# Patient Record
Sex: Female | Born: 1935 | Race: White | Hispanic: No | Smoking: Never smoker
Health system: Southern US, Community
[De-identification: ages and names within clinical notes are randomized; demographics above are authoritative.]

## PROBLEM LIST (undated history)

## (undated) DIAGNOSIS — G459 Transient cerebral ischemic attack, unspecified: Secondary | ICD-10-CM

## (undated) DIAGNOSIS — F0280 Dementia in other diseases classified elsewhere without behavioral disturbance: Secondary | ICD-10-CM

## (undated) DIAGNOSIS — I10 Essential (primary) hypertension: Secondary | ICD-10-CM

## (undated) DIAGNOSIS — S72001A Fracture of unspecified part of neck of right femur, initial encounter for closed fracture: Secondary | ICD-10-CM

## (undated) HISTORY — PX: ABDOMINAL HYSTERECTOMY: SHX81

## (undated) HISTORY — PX: FRACTURE SURGERY: SHX138

---

## 1997-07-25 ENCOUNTER — Other Ambulatory Visit: Admission: RE | Admit: 1997-07-25 | Discharge: 1997-07-25 | Payer: Self-pay | Admitting: Obstetrics & Gynecology

## 1997-09-27 ENCOUNTER — Ambulatory Visit (HOSPITAL_COMMUNITY): Admission: RE | Admit: 1997-09-27 | Discharge: 1997-09-27 | Payer: Self-pay | Admitting: Geriatric Medicine

## 1999-08-15 ENCOUNTER — Other Ambulatory Visit: Admission: RE | Admit: 1999-08-15 | Discharge: 1999-08-15 | Payer: Self-pay | Admitting: Obstetrics & Gynecology

## 2000-02-19 ENCOUNTER — Ambulatory Visit (HOSPITAL_COMMUNITY): Admission: RE | Admit: 2000-02-19 | Discharge: 2000-02-19 | Payer: Self-pay | Admitting: Geriatric Medicine

## 2000-04-22 ENCOUNTER — Ambulatory Visit (HOSPITAL_COMMUNITY): Admission: RE | Admit: 2000-04-22 | Discharge: 2000-04-22 | Payer: Self-pay | Admitting: Gastroenterology

## 2001-05-03 ENCOUNTER — Ambulatory Visit (HOSPITAL_COMMUNITY): Admission: RE | Admit: 2001-05-03 | Discharge: 2001-05-03 | Payer: Self-pay | Admitting: Geriatric Medicine

## 2001-07-16 ENCOUNTER — Other Ambulatory Visit: Admission: RE | Admit: 2001-07-16 | Discharge: 2001-07-16 | Payer: Self-pay | Admitting: Family Medicine

## 2004-01-01 ENCOUNTER — Other Ambulatory Visit: Admission: RE | Admit: 2004-01-01 | Discharge: 2004-01-01 | Payer: Self-pay | Admitting: Obstetrics & Gynecology

## 2008-08-10 ENCOUNTER — Ambulatory Visit: Payer: Self-pay | Admitting: Vascular Surgery

## 2010-08-30 NOTE — Procedures (Signed)
[facility]  Patient:    Kimberly Ramirez, Kimberly Ramirez                         MRN: [id number] Proc. Date: 04/22/00 Adm. Date:  [id number] Attending:  Louie Bun CC:         Hal T. Stoneking, M.D.   Procedure Report  PROCEDURE:  Colonoscopy.  INDICATION FOR PROCEDURE:  A 77 year old patient with no prior colon imaging referred for screening colonoscopy.  DESCRIPTION OF PROCEDURE:  The patient was placed in the left lateral decubitus position and placed on the pulse monitor with continuous low flow oxygen delivered by nasal cannula. She was sedated with 70 mg IV Demerol and 7 mg IV Versed. The Olympus video colonoscope was inserted into the rectum and advanced to the cecum, confirmed by transillumination at McBurneys point and visualization of the ileocecal valve and appendiceal orifice. The prep was excellent. The cecum, ascending, transverse, and descending colon all appeared normal with no masses, polyps, diverticula or other mucosal abnormalities. Within the sigmoid colon were seen a few scattered diverticula and no other abnormalities. The rectum appeared normal and retroflexed view of the anus revealed  no obvious internal hemorrhoids.  The colonoscope was then withdrawn and the patient returned to the recovery room in stable condition. The patient tolerated the procedure well and there were no immediate complications.  IMPRESSION:  Left sided diverticulosis otherwise normal colonoscopy.  PLAN:  Average risk colon cancer screening with consideration of flexible sigmoidoscopy in 5 years. DD:  04/22/00 TD:  04/22/00 Job: [id number]

## 2011-04-21 DIAGNOSIS — Z124 Encounter for screening for malignant neoplasm of cervix: Secondary | ICD-10-CM | POA: Diagnosis not present

## 2011-04-21 DIAGNOSIS — Z1212 Encounter for screening for malignant neoplasm of rectum: Secondary | ICD-10-CM | POA: Diagnosis not present

## 2011-06-04 DIAGNOSIS — B351 Tinea unguium: Secondary | ICD-10-CM | POA: Diagnosis not present

## 2011-06-04 DIAGNOSIS — M79609 Pain in unspecified limb: Secondary | ICD-10-CM | POA: Diagnosis not present

## 2011-06-23 DIAGNOSIS — M810 Age-related osteoporosis without current pathological fracture: Secondary | ICD-10-CM | POA: Diagnosis not present

## 2011-06-23 DIAGNOSIS — E349 Endocrine disorder, unspecified: Secondary | ICD-10-CM | POA: Diagnosis not present

## 2011-06-23 DIAGNOSIS — R29890 Loss of height: Secondary | ICD-10-CM | POA: Diagnosis not present

## 2011-06-23 DIAGNOSIS — N3945 Continuous leakage: Secondary | ICD-10-CM | POA: Diagnosis not present

## 2011-06-23 DIAGNOSIS — E8941 Symptomatic postprocedural ovarian failure: Secondary | ICD-10-CM | POA: Diagnosis not present

## 2011-06-30 DIAGNOSIS — Z85828 Personal history of other malignant neoplasm of skin: Secondary | ICD-10-CM | POA: Diagnosis not present

## 2011-06-30 DIAGNOSIS — L821 Other seborrheic keratosis: Secondary | ICD-10-CM | POA: Diagnosis not present

## 2011-09-03 DIAGNOSIS — B351 Tinea unguium: Secondary | ICD-10-CM | POA: Diagnosis not present

## 2011-09-03 DIAGNOSIS — M79609 Pain in unspecified limb: Secondary | ICD-10-CM | POA: Diagnosis not present

## 2011-11-27 DIAGNOSIS — H2640 Unspecified secondary cataract: Secondary | ICD-10-CM | POA: Diagnosis not present

## 2011-11-27 DIAGNOSIS — H3530 Unspecified macular degeneration: Secondary | ICD-10-CM | POA: Diagnosis not present

## 2011-11-27 DIAGNOSIS — H43819 Vitreous degeneration, unspecified eye: Secondary | ICD-10-CM | POA: Diagnosis not present

## 2011-11-27 DIAGNOSIS — Z961 Presence of intraocular lens: Secondary | ICD-10-CM | POA: Diagnosis not present

## 2011-12-09 DIAGNOSIS — M1710 Unilateral primary osteoarthritis, unspecified knee: Secondary | ICD-10-CM | POA: Diagnosis not present

## 2011-12-09 DIAGNOSIS — M25569 Pain in unspecified knee: Secondary | ICD-10-CM | POA: Diagnosis not present

## 2012-04-01 DIAGNOSIS — L821 Other seborrheic keratosis: Secondary | ICD-10-CM | POA: Diagnosis not present

## 2012-04-01 DIAGNOSIS — L719 Rosacea, unspecified: Secondary | ICD-10-CM | POA: Diagnosis not present

## 2012-04-01 DIAGNOSIS — Z85828 Personal history of other malignant neoplasm of skin: Secondary | ICD-10-CM | POA: Diagnosis not present

## 2012-04-27 DIAGNOSIS — Z130 Encounter for screening for diseases of the blood and blood-forming organs and certain disorders involving the immune mechanism: Secondary | ICD-10-CM | POA: Diagnosis not present

## 2012-04-27 DIAGNOSIS — Z1212 Encounter for screening for malignant neoplasm of rectum: Secondary | ICD-10-CM | POA: Diagnosis not present

## 2012-04-27 DIAGNOSIS — Z124 Encounter for screening for malignant neoplasm of cervix: Secondary | ICD-10-CM | POA: Diagnosis not present

## 2012-04-27 DIAGNOSIS — B373 Candidiasis of vulva and vagina: Secondary | ICD-10-CM | POA: Diagnosis not present

## 2012-07-01 DIAGNOSIS — L84 Corns and callosities: Secondary | ICD-10-CM | POA: Diagnosis not present

## 2012-07-01 DIAGNOSIS — B351 Tinea unguium: Secondary | ICD-10-CM | POA: Diagnosis not present

## 2012-07-01 DIAGNOSIS — M79609 Pain in unspecified limb: Secondary | ICD-10-CM | POA: Diagnosis not present

## 2012-10-08 DIAGNOSIS — Z1231 Encounter for screening mammogram for malignant neoplasm of breast: Secondary | ICD-10-CM | POA: Diagnosis not present

## 2012-10-21 DIAGNOSIS — Z961 Presence of intraocular lens: Secondary | ICD-10-CM | POA: Diagnosis not present

## 2012-10-21 DIAGNOSIS — M7660 Achilles tendinitis, unspecified leg: Secondary | ICD-10-CM | POA: Diagnosis not present

## 2012-10-21 DIAGNOSIS — L84 Corns and callosities: Secondary | ICD-10-CM | POA: Diagnosis not present

## 2012-10-21 DIAGNOSIS — H2640 Unspecified secondary cataract: Secondary | ICD-10-CM | POA: Diagnosis not present

## 2012-10-21 DIAGNOSIS — M79609 Pain in unspecified limb: Secondary | ICD-10-CM | POA: Diagnosis not present

## 2012-10-21 DIAGNOSIS — H3530 Unspecified macular degeneration: Secondary | ICD-10-CM | POA: Diagnosis not present

## 2012-10-21 DIAGNOSIS — H43819 Vitreous degeneration, unspecified eye: Secondary | ICD-10-CM | POA: Diagnosis not present

## 2012-10-21 DIAGNOSIS — B351 Tinea unguium: Secondary | ICD-10-CM | POA: Diagnosis not present

## 2013-01-19 ENCOUNTER — Ambulatory Visit: Payer: Self-pay | Admitting: Podiatrist

## 2013-01-21 DIAGNOSIS — L219 Seborrheic dermatitis, unspecified: Secondary | ICD-10-CM | POA: Diagnosis not present

## 2013-01-21 DIAGNOSIS — Z85828 Personal history of other malignant neoplasm of skin: Secondary | ICD-10-CM | POA: Diagnosis not present

## 2013-02-16 DIAGNOSIS — L821 Other seborrheic keratosis: Secondary | ICD-10-CM | POA: Diagnosis not present

## 2013-02-16 DIAGNOSIS — D239 Other benign neoplasm of skin, unspecified: Secondary | ICD-10-CM | POA: Diagnosis not present

## 2013-02-16 DIAGNOSIS — L219 Seborrheic dermatitis, unspecified: Secondary | ICD-10-CM | POA: Diagnosis not present

## 2013-02-16 DIAGNOSIS — T148 Other injury of unspecified body region: Secondary | ICD-10-CM | POA: Diagnosis not present

## 2013-02-16 DIAGNOSIS — B351 Tinea unguium: Secondary | ICD-10-CM | POA: Diagnosis not present

## 2013-02-16 DIAGNOSIS — Z79899 Other long term (current) drug therapy: Secondary | ICD-10-CM | POA: Diagnosis not present

## 2013-02-16 DIAGNOSIS — D1801 Hemangioma of skin and subcutaneous tissue: Secondary | ICD-10-CM | POA: Diagnosis not present

## 2013-02-16 DIAGNOSIS — Z85828 Personal history of other malignant neoplasm of skin: Secondary | ICD-10-CM | POA: Diagnosis not present

## 2013-02-16 DIAGNOSIS — L819 Disorder of pigmentation, unspecified: Secondary | ICD-10-CM | POA: Diagnosis not present

## 2013-02-22 DIAGNOSIS — Z79899 Other long term (current) drug therapy: Secondary | ICD-10-CM | POA: Diagnosis not present

## 2013-02-22 DIAGNOSIS — Z1331 Encounter for screening for depression: Secondary | ICD-10-CM | POA: Diagnosis not present

## 2013-02-22 DIAGNOSIS — I10 Essential (primary) hypertension: Secondary | ICD-10-CM | POA: Diagnosis not present

## 2013-02-22 DIAGNOSIS — E7800 Pure hypercholesterolemia, unspecified: Secondary | ICD-10-CM | POA: Diagnosis not present

## 2013-02-22 DIAGNOSIS — Z0000 Encounter for general adult medical examination without abnormal findings: Secondary | ICD-10-CM | POA: Diagnosis not present

## 2013-04-27 DIAGNOSIS — Z130 Encounter for screening for diseases of the blood and blood-forming organs and certain disorders involving the immune mechanism: Secondary | ICD-10-CM | POA: Diagnosis not present

## 2013-04-27 DIAGNOSIS — Z1212 Encounter for screening for malignant neoplasm of rectum: Secondary | ICD-10-CM | POA: Diagnosis not present

## 2013-04-27 DIAGNOSIS — N760 Acute vaginitis: Secondary | ICD-10-CM | POA: Diagnosis not present

## 2013-04-27 DIAGNOSIS — Z01419 Encounter for gynecological examination (general) (routine) without abnormal findings: Secondary | ICD-10-CM | POA: Diagnosis not present

## 2013-04-27 DIAGNOSIS — Z124 Encounter for screening for malignant neoplasm of cervix: Secondary | ICD-10-CM | POA: Diagnosis not present

## 2013-05-23 DIAGNOSIS — E7800 Pure hypercholesterolemia, unspecified: Secondary | ICD-10-CM | POA: Diagnosis not present

## 2013-05-23 DIAGNOSIS — Z79899 Other long term (current) drug therapy: Secondary | ICD-10-CM | POA: Diagnosis not present

## 2013-06-16 ENCOUNTER — Ambulatory Visit: Payer: Self-pay | Admitting: Podiatrist

## 2013-08-18 ENCOUNTER — Ambulatory Visit (INDEPENDENT_AMBULATORY_CARE_PROVIDER_SITE_OTHER): Payer: Medicare Other | Admitting: Podiatrist

## 2013-08-18 ENCOUNTER — Encounter: Payer: Self-pay | Admitting: Podiatrist

## 2013-08-18 VITALS — BP 155/71 | HR 82 | Resp 15 | Ht 65.0 in | Wt 123.0 lb

## 2013-08-18 DIAGNOSIS — M79609 Pain in unspecified limb: Secondary | ICD-10-CM | POA: Diagnosis not present

## 2013-08-18 DIAGNOSIS — B351 Tinea unguium: Secondary | ICD-10-CM | POA: Diagnosis not present

## 2013-08-18 NOTE — Progress Notes (Signed)
   Subjective:    [name], female    DOB: 02-03-1928, 80 y.o.   MRN: [id number]  HPI Comments: Pt presents for debridement of 1 - 10 toenails, B/L 5th toe corns.     Review of Systems  Skin:       Hair loss pt attributes to cholesterol medication or hormones.  All other systems reviewed and are negative.      Objective:   Physical Exam  Patient is awake, alert, and oriented x 3.  In no acute distress.  Neurovascular status is intact with palpable pedal pulses at 2/4 DP and PT bilateral and capillary refill time within normal limits. Neurological sensation is also intact bilaterally both epicritically and protectively. Dermatological exam reveals hyperkeratotic lesion present on the fifth digit bilaterally. Musculature intact with dorsiflexion, plantarflexion, inversion, eversion. Her nails are elongated, thickened, discolored, dystrophic and clinically mycotic.     Assessment & Plan:  Symptomatically mycotic toenails  Debridement of toenails carried out today without complication. She'll be seen back in 3 months or as needed for followup.

## 2013-08-23 DIAGNOSIS — E7800 Pure hypercholesterolemia, unspecified: Secondary | ICD-10-CM | POA: Diagnosis not present

## 2013-08-23 DIAGNOSIS — I10 Essential (primary) hypertension: Secondary | ICD-10-CM | POA: Diagnosis not present

## 2013-08-23 DIAGNOSIS — Z79899 Other long term (current) drug therapy: Secondary | ICD-10-CM | POA: Diagnosis not present

## 2013-08-23 DIAGNOSIS — Z23 Encounter for immunization: Secondary | ICD-10-CM | POA: Diagnosis not present

## 2013-09-01 DIAGNOSIS — H6120 Impacted cerumen, unspecified ear: Secondary | ICD-10-CM | POA: Diagnosis not present

## 2013-09-01 DIAGNOSIS — J310 Chronic rhinitis: Secondary | ICD-10-CM | POA: Diagnosis not present

## 2013-11-17 ENCOUNTER — Ambulatory Visit (INDEPENDENT_AMBULATORY_CARE_PROVIDER_SITE_OTHER): Payer: Medicare Other | Admitting: Podiatrist

## 2013-11-17 DIAGNOSIS — M79609 Pain in unspecified limb: Secondary | ICD-10-CM

## 2013-11-17 DIAGNOSIS — M79673 Pain in unspecified foot: Secondary | ICD-10-CM

## 2013-11-17 DIAGNOSIS — B351 Tinea unguium: Secondary | ICD-10-CM

## 2013-11-17 NOTE — Patient Instructions (Signed)
Achilles Tendinitis Achilles tendinitis is inflammation of the tough, cord-like band that attaches the lower muscles of your leg to your heel (Achilles tendon). It is usually caused by overusing the tendon and joint involved.  CAUSES Achilles tendinitis can happen because of:  A sudden increase in exercise or activity (such as running).  Doing the same exercises or activities (such as jumping) over and over.  Not warming up calf muscles before exercising.  Exercising in shoes that are worn out or not made for exercise.  Having arthritis or a bone growth on the back of the heel bone. This can rub against the tendon and hurt the tendon. SIGNS AND SYMPTOMS The most common symptoms are:  Pain in the back of the leg, just above the heel. The pain usually gets worse with exercise and better with rest.  Stiffness or soreness in the back of the leg, especially in the morning.  Swelling of the skin over the Achilles tendon.  Trouble standing on tiptoe. Sometimes, an Achilles tendon tears (ruptures). Symptoms of an Achilles tendon rupture can include:  Sudden, severe pain in the back of the leg.  Trouble putting weight on the foot or walking normally. DIAGNOSIS Achilles tendinitis will be diagnosed based on symptoms and a physical examination. An X-ray may be done to check if another condition is causing your symptoms. An MRI may be ordered if your health care provider suspects you may have completely torn your tendon, which is called an Achilles tendon rupture.  TREATMENT  Achilles tendinitis usually gets better over time. It can take weeks to months to heal completely. Treatment focuses on treating the symptoms and helping the injury heal. HOME CARE INSTRUCTIONS   Rest your Achilles tendon and avoid activities that cause pain.  Apply ice to the injured area:  Put ice in a plastic bag.  Place a towel between your skin and the bag.  Leave the ice on for 20 minutes, 2-3 times a  day  Try to avoid using the tendon (other than gentle range of motion) while the tendon is painful. Do not resume use until instructed by your health care provider. Then begin use gradually. Do not increase use to the point of pain. If pain does develop, decrease use and continue the above measures. Gradually increase activities that do not cause discomfort until you achieve normal use.  Do exercises to make your calf muscles stronger and more flexible. Your health care provider or physical therapist can recommend exercises for you to do.  Wrap your ankle with an elastic bandage or other wrap. This can help keep your tendon from moving too much. Your health care provider will show you how to wrap your ankle correctly.  Only take over-the-counter or prescription medicines for pain, discomfort, or fever as directed by your health care provider. SEEK MEDICAL CARE IF:   Your pain and swelling increase or pain is uncontrolled with medicines.  You develop new, unexplained symptoms or your symptoms get worse.  You are unable to move your toes or foot.  You develop warmth and swelling in your foot.  You have an unexplained temperature. MAKE SURE YOU:   Understand these instructions.  Will watch your condition.  Will get help right away if you are not doing well or get worse. Document Released: 01/08/2005 Document Revised: 01/19/2013 Document Reviewed: 11/10/2012 [employer] Patient Information 2015 [employer]. This information is not intended to replace advice given to you by your health care provider. Make sure you discuss   any questions you have with your health care provider.  

## 2013-11-17 NOTE — Progress Notes (Signed)
   Subjective:    [name], female    DOB: 1927-08-22, 80 y.o.   MRN: [id number]  HPI Pt will bring medication on next visit.   Review of Systems     Objective:   Physical Exam She has pain on the posterior aspect of the right heel which is new. Mild swelling is present and mild discomfort is palpated. Neurovascular status is intact and unchanged. She has a callus on the fifth digits bilaterally and on the right hallux and medial aspect of the first metatarsal head. Intact integument is present. Patient's toenails are also elongated, thickened, mycotic and symptomatic.    Assessment & Plan:  Symptomatic and mycotic toenails, calluses x3 on Achilles tendinitis  Plan: Debridement of calluses and nails is accomplished today without complication. She'll be seen back in 3 months or as needed for followup. Recommended heel lifts and stretching exercises were dispensed

## 2013-11-21 DIAGNOSIS — Z79899 Other long term (current) drug therapy: Secondary | ICD-10-CM | POA: Diagnosis not present

## 2013-11-21 DIAGNOSIS — E7800 Pure hypercholesterolemia, unspecified: Secondary | ICD-10-CM | POA: Diagnosis not present

## 2014-02-23 ENCOUNTER — Ambulatory Visit (INDEPENDENT_AMBULATORY_CARE_PROVIDER_SITE_OTHER): Payer: Medicare Other | Admitting: Podiatrist

## 2014-02-23 ENCOUNTER — Encounter: Payer: Self-pay | Admitting: Podiatrist

## 2014-02-23 DIAGNOSIS — M79676 Pain in unspecified toe(s): Secondary | ICD-10-CM | POA: Diagnosis not present

## 2014-02-23 DIAGNOSIS — B351 Tinea unguium: Secondary | ICD-10-CM

## 2014-02-24 NOTE — Progress Notes (Signed)
   Subjective:    [name], female    DOB: 1927-08-18, 80 y.o.   MRN: [id number]  HPI Comments: Pt presents for debridement of 1 - 10 toenails, B/L 5th toe corns.       Objective:   Physical Exam  Patient is awake, alert, and oriented x 3.  In no acute distress.  Neurovascular status is intact with palpable pedal pulses at 2/4 DP and PT bilateral and capillary refill time within normal limits. Neurological sensation is also intact bilaterally both epicritically and protectively. Dermatological exam reveals hyperkeratotic lesion present on the fifth digit bilaterally. Musculature intact with dorsiflexion, plantarflexion, inversion, eversion. Her nails are elongated, thickened, discolored, dystrophic and clinically mycotic.     Assessment & Plan:  Symptomatically mycotic toenails  Debridement of toenails carried out today without complication. She'll be seen back in 3 months or as needed for followup.

## 2014-04-04 DIAGNOSIS — Z0000 Encounter for general adult medical examination without abnormal findings: Secondary | ICD-10-CM | POA: Diagnosis not present

## 2014-04-04 DIAGNOSIS — I10 Essential (primary) hypertension: Secondary | ICD-10-CM | POA: Diagnosis not present

## 2014-04-04 DIAGNOSIS — Z1389 Encounter for screening for other disorder: Secondary | ICD-10-CM | POA: Diagnosis not present

## 2014-04-04 DIAGNOSIS — Z23 Encounter for immunization: Secondary | ICD-10-CM | POA: Diagnosis not present

## 2014-04-04 DIAGNOSIS — Z79899 Other long term (current) drug therapy: Secondary | ICD-10-CM | POA: Diagnosis not present

## 2014-04-04 DIAGNOSIS — E780 Pure hypercholesterolemia: Secondary | ICD-10-CM | POA: Diagnosis not present

## 2014-04-21 DIAGNOSIS — L821 Other seborrheic keratosis: Secondary | ICD-10-CM | POA: Diagnosis not present

## 2014-04-21 DIAGNOSIS — Z85828 Personal history of other malignant neoplasm of skin: Secondary | ICD-10-CM | POA: Diagnosis not present

## 2014-04-21 DIAGNOSIS — D225 Melanocytic nevi of trunk: Secondary | ICD-10-CM | POA: Diagnosis not present

## 2014-04-21 DIAGNOSIS — D2262 Melanocytic nevi of left upper limb, including shoulder: Secondary | ICD-10-CM | POA: Diagnosis not present

## 2014-04-21 DIAGNOSIS — L570 Actinic keratosis: Secondary | ICD-10-CM | POA: Diagnosis not present

## 2014-04-21 DIAGNOSIS — D1801 Hemangioma of skin and subcutaneous tissue: Secondary | ICD-10-CM | POA: Diagnosis not present

## 2014-04-21 DIAGNOSIS — L814 Other melanin hyperpigmentation: Secondary | ICD-10-CM | POA: Diagnosis not present

## 2014-04-21 DIAGNOSIS — L218 Other seborrheic dermatitis: Secondary | ICD-10-CM | POA: Diagnosis not present

## 2014-08-02 DIAGNOSIS — Z01419 Encounter for gynecological examination (general) (routine) without abnormal findings: Secondary | ICD-10-CM | POA: Diagnosis not present

## 2014-08-02 DIAGNOSIS — N958 Other specified menopausal and perimenopausal disorders: Secondary | ICD-10-CM | POA: Diagnosis not present

## 2014-08-02 DIAGNOSIS — Z6821 Body mass index (BMI) 21.0-21.9, adult: Secondary | ICD-10-CM | POA: Diagnosis not present

## 2014-08-02 DIAGNOSIS — M816 Localized osteoporosis [Lequesne]: Secondary | ICD-10-CM | POA: Diagnosis not present

## 2014-08-07 DIAGNOSIS — H52203 Unspecified astigmatism, bilateral: Secondary | ICD-10-CM | POA: Diagnosis not present

## 2014-08-07 DIAGNOSIS — H3531 Nonexudative age-related macular degeneration: Secondary | ICD-10-CM | POA: Diagnosis not present

## 2014-08-07 DIAGNOSIS — Z961 Presence of intraocular lens: Secondary | ICD-10-CM | POA: Diagnosis not present

## 2014-08-07 DIAGNOSIS — H26493 Other secondary cataract, bilateral: Secondary | ICD-10-CM | POA: Diagnosis not present

## 2014-08-30 DIAGNOSIS — N390 Urinary tract infection, site not specified: Secondary | ICD-10-CM | POA: Diagnosis not present

## 2014-10-31 ENCOUNTER — Encounter: Payer: Self-pay | Admitting: Podiatry

## 2014-10-31 ENCOUNTER — Ambulatory Visit (INDEPENDENT_AMBULATORY_CARE_PROVIDER_SITE_OTHER): Payer: Medicare Other | Admitting: Podiatry

## 2014-10-31 DIAGNOSIS — M79676 Pain in unspecified toe(s): Secondary | ICD-10-CM | POA: Diagnosis not present

## 2014-10-31 DIAGNOSIS — B351 Tinea unguium: Secondary | ICD-10-CM

## 2014-10-31 NOTE — Progress Notes (Signed)
[name], female   DOB: [birth year], 81 y.o.   MRN: [id number] Complaint:  Visit Type: Patient returns to my office for continued preventative foot care services. Complaint: Patient states" my nails have grown long and thick and become painful to walk and wear shoes" . She presents for preventative foot care services. No changes to ROS  Podiatric Exam: Vascular: dorsalis pedis and posterior tibial pulses are palpable bilateral. Capillary return is immediate. Temperature gradient is WNL. Skin turgor WNL  Sensorium: Normal Semmes Weinstein monofilament test. Normal tactile sensation bilaterally. Nail Exam: Pt has thick disfigured discolored nails with subungual debris noted bilateral entire nail hallux through fifth toenails Ulcer Exam: There is no evidence of ulcer or pre-ulcerative changes or infection. Orthopedic Exam: Muscle tone and strength are WNL. No limitations in general ROM. No crepitus or effusions noted. Foot type and digits show no abnormalities. Bony prominences are unremarkable. Skin: No Porokeratosis. No infection or ulcers.  Heloma durum 5th toe B/L  Diagnosis:  Tinea unguium, Pain in right toe, pain in left toes  Treatment & Plan Procedures and Treatment: Consent by patient was obtained for treatment procedures. The patient understood the discussion of treatment and procedures well. All questions were answered thoroughly reviewed. Debridement of mycotic and hypertrophic toenails, 1 through 5 bilateral and clearing of subungual debris. No ulceration, no infection noted.  Return Visit-Office Procedure: Patient instructed to return to the office for a follow up visit 3 months for continued evaluation and treatment.

## 2015-01-10 DIAGNOSIS — N3946 Mixed incontinence: Secondary | ICD-10-CM | POA: Diagnosis not present

## 2015-01-25 ENCOUNTER — Encounter: Payer: Self-pay | Admitting: Podiatry

## 2015-01-25 ENCOUNTER — Ambulatory Visit (INDEPENDENT_AMBULATORY_CARE_PROVIDER_SITE_OTHER): Payer: Medicare Other | Admitting: Podiatry

## 2015-01-25 DIAGNOSIS — M79676 Pain in unspecified toe(s): Secondary | ICD-10-CM | POA: Diagnosis not present

## 2015-01-25 DIAGNOSIS — B351 Tinea unguium: Secondary | ICD-10-CM | POA: Diagnosis not present

## 2015-01-25 DIAGNOSIS — L84 Corns and callosities: Secondary | ICD-10-CM

## 2015-01-25 NOTE — Progress Notes (Signed)
[name], female   DOB: 05-Mar-1928, 81 y.o.   MRN: [id number] Complaint:  Visit Type: Patient returns to my office for continued preventative foot care services. Complaint: Patient states" my nails have grown long and thick and become painful to walk and wear shoes" . She presents for preventative foot care services. No changes to ROS  Podiatric Exam: Vascular: dorsalis pedis and posterior tibial pulses are palpable bilateral. Capillary return is immediate. Temperature gradient is WNL. Skin turgor WNL  Sensorium: Normal Semmes Weinstein monofilament test. Normal tactile sensation bilaterally. Nail Exam: Pt has thick disfigured discolored nails with subungual debris noted bilateral entire nail hallux through fifth toenails Ulcer Exam: There is no evidence of ulcer or pre-ulcerative changes or infection. Orthopedic Exam: Muscle tone and strength are WNL. No limitations in general ROM. No crepitus or effusions noted. Foot type and digits show no abnormalities. Bony prominences are unremarkable. Skin: No Porokeratosis. No infection or ulcers.  Heloma durum 5th toe B/L  Diagnosis:  Tinea unguium, Pain in right toe, pain in left toe, f painful corn fifth toe left foot  Treatment & Plan Procedures and Treatment: Consent by patient was obtained for treatment procedures. The patient understood the discussion of treatment and procedures well. All questions were answered thoroughly reviewed. Debridement of mycotic and hypertrophic toenails, 1 through 5 bilateral and clearing of subungual debris. No ulceration, no infection noted. Debridement fifth toe left foot. Return Visit-Office Procedure: Patient instructed to return to the office for a follow up visit 3 months for continued evaluation and treatment.

## 2015-02-02 ENCOUNTER — Ambulatory Visit: Payer: Medicare Other | Admitting: Podiatry

## 2015-02-08 ENCOUNTER — Ambulatory Visit: Payer: Medicare Other | Admitting: Podiatry

## 2015-04-27 ENCOUNTER — Ambulatory Visit: Payer: Medicare Other | Admitting: Podiatry

## 2015-05-02 ENCOUNTER — Ambulatory Visit: Payer: Medicare Other | Admitting: Podiatry

## 2015-06-20 ENCOUNTER — Encounter: Payer: Self-pay | Admitting: Podiatry

## 2015-06-20 ENCOUNTER — Ambulatory Visit (INDEPENDENT_AMBULATORY_CARE_PROVIDER_SITE_OTHER): Payer: Medicare Other | Admitting: Podiatry

## 2015-06-20 DIAGNOSIS — M79676 Pain in unspecified toe(s): Secondary | ICD-10-CM

## 2015-06-20 DIAGNOSIS — L84 Corns and callosities: Secondary | ICD-10-CM | POA: Diagnosis not present

## 2015-06-20 DIAGNOSIS — B351 Tinea unguium: Secondary | ICD-10-CM

## 2015-06-20 NOTE — Progress Notes (Signed)
[name], female   DOB: 1927-05-18, 82 y.o.   MRN: [id number] Complaint:  Visit Type: Patient returns to my office for continued preventative foot care services. Complaint: Patient states" my nails have grown long and thick and become painful to walk and wear shoes" . She presents for preventative foot care services. No changes to ROS  Podiatric Exam: Vascular: dorsalis pedis and posterior tibial pulses are palpable bilateral. Capillary return is immediate. Temperature gradient is WNL. Skin turgor WNL  Sensorium: Normal Semmes Weinstein monofilament test. Normal tactile sensation bilaterally. Nail Exam: Pt has thick disfigured discolored nails with subungual debris noted bilateral entire nail hallux through fifth toenails Ulcer Exam: There is no evidence of ulcer or pre-ulcerative changes or infection. Orthopedic Exam: Muscle tone and strength are WNL. No limitations in general ROM. No crepitus or effusions noted. Foot type and digits show no abnormalities. Bony prominences are unremarkable. Skin: No Porokeratosis. No infection or ulcers.  Heloma durum 5th toe B/L  Diagnosis:  Tinea unguium, Pain in right toe, pain in left toe,  painful corn fifth toe left foot  Treatment & Plan Procedures and Treatment: Consent by patient was obtained for treatment procedures. The patient understood the discussion of treatment and procedures well. All questions were answered thoroughly reviewed. Debridement of mycotic and hypertrophic toenails, 1 through 5 bilateral and clearing of subungual debris. No ulceration, no infection noted. Debridement fifth toe left foot. Return Visit-Office Procedure: Patient instructed to return to the office for a follow up visit 3 months for continued evaluation and treatment.   Gardiner Barefoot DPM

## 2015-07-31 DIAGNOSIS — Z1389 Encounter for screening for other disorder: Secondary | ICD-10-CM | POA: Diagnosis not present

## 2015-07-31 DIAGNOSIS — Z0000 Encounter for general adult medical examination without abnormal findings: Secondary | ICD-10-CM | POA: Diagnosis not present

## 2015-08-15 DIAGNOSIS — Z124 Encounter for screening for malignant neoplasm of cervix: Secondary | ICD-10-CM | POA: Diagnosis not present

## 2015-08-15 DIAGNOSIS — N390 Urinary tract infection, site not specified: Secondary | ICD-10-CM | POA: Diagnosis not present

## 2015-08-15 DIAGNOSIS — Z1272 Encounter for screening for malignant neoplasm of vagina: Secondary | ICD-10-CM | POA: Diagnosis not present

## 2015-08-15 DIAGNOSIS — Z6821 Body mass index (BMI) 21.0-21.9, adult: Secondary | ICD-10-CM | POA: Diagnosis not present

## 2015-08-15 DIAGNOSIS — N760 Acute vaginitis: Secondary | ICD-10-CM | POA: Diagnosis not present

## 2015-08-29 DIAGNOSIS — Z79899 Other long term (current) drug therapy: Secondary | ICD-10-CM | POA: Diagnosis not present

## 2015-08-29 DIAGNOSIS — E7800 Pure hypercholesterolemia, unspecified: Secondary | ICD-10-CM | POA: Diagnosis not present

## 2015-08-29 DIAGNOSIS — Z23 Encounter for immunization: Secondary | ICD-10-CM | POA: Diagnosis not present

## 2015-08-29 DIAGNOSIS — I10 Essential (primary) hypertension: Secondary | ICD-10-CM | POA: Diagnosis not present

## 2015-09-12 ENCOUNTER — Ambulatory Visit: Payer: Medicare Other | Admitting: Podiatry

## 2015-09-12 DIAGNOSIS — L904 Acrodermatitis chronica atrophicans: Secondary | ICD-10-CM | POA: Diagnosis not present

## 2015-10-08 DIAGNOSIS — R42 Dizziness and giddiness: Secondary | ICD-10-CM | POA: Diagnosis not present

## 2015-10-08 DIAGNOSIS — H6121 Impacted cerumen, right ear: Secondary | ICD-10-CM | POA: Diagnosis not present

## 2015-11-29 ENCOUNTER — Ambulatory Visit (INDEPENDENT_AMBULATORY_CARE_PROVIDER_SITE_OTHER): Payer: Medicare Other | Admitting: Podiatry

## 2015-11-29 DIAGNOSIS — B351 Tinea unguium: Secondary | ICD-10-CM | POA: Diagnosis not present

## 2015-11-29 DIAGNOSIS — L84 Corns and callosities: Secondary | ICD-10-CM

## 2015-11-29 DIAGNOSIS — M79676 Pain in unspecified toe(s): Secondary | ICD-10-CM | POA: Diagnosis not present

## 2015-11-29 NOTE — Progress Notes (Signed)
[name], female   DOB: Jul 20, 1927, 82 y.o.   MRN: [id number] Complaint:  Visit Type: Patient returns to my office for continued preventative foot care services. Complaint: Patient states" my nails have grown long and thick and become painful to walk and wear shoes" . She presents for preventative foot care services. No changes to ROS  Podiatric Exam: Vascular: dorsalis pedis and posterior tibial pulses are palpable bilateral. Capillary return is immediate. Temperature gradient is WNL. Skin turgor WNL  Sensorium: Normal Semmes Weinstein monofilament test. Normal tactile sensation bilaterally. Nail Exam: Pt has thick disfigured discolored nails with subungual debris noted bilateral entire nail hallux through fifth toenails Ulcer Exam: There is no evidence of ulcer or pre-ulcerative changes or infection. Orthopedic Exam: Muscle tone and strength are WNL. No limitations in general ROM. No crepitus or effusions noted. HAV 1st MPJ  Right foot.. Skin: No Porokeratosis. No infection or ulcers.  Heloma durum 5th toe B/L  Diagnosis:  Tinea unguium, Pain in right toe, pain in left toe,  painful corn fifth toe left foot  Treatment & Plan Procedures and Treatment: Consent by patient was obtained for treatment procedures. The patient understood the discussion of treatment and procedures well. All questions were answered thoroughly reviewed. Debridement of mycotic and hypertrophic toenails, 1 through 5 bilateral and clearing of subungual debris. No ulceration, no infection noted. Debridement fifth toe left foot. Return Visit-Office Procedure: Patient instructed to return to the office for a follow up visit 3 months for continued evaluation and treatment.   Gardiner Barefoot DPM

## 2015-12-20 DIAGNOSIS — L718 Other rosacea: Secondary | ICD-10-CM | POA: Diagnosis not present

## 2015-12-20 DIAGNOSIS — Z85828 Personal history of other malignant neoplasm of skin: Secondary | ICD-10-CM | POA: Diagnosis not present

## 2016-02-11 DIAGNOSIS — Z23 Encounter for immunization: Secondary | ICD-10-CM | POA: Diagnosis not present

## 2016-02-14 ENCOUNTER — Ambulatory Visit (INDEPENDENT_AMBULATORY_CARE_PROVIDER_SITE_OTHER): Payer: Medicare Other | Admitting: Podiatry

## 2016-02-14 VITALS — Ht 65.0 in | Wt 123.0 lb

## 2016-02-14 DIAGNOSIS — L84 Corns and callosities: Secondary | ICD-10-CM | POA: Diagnosis not present

## 2016-02-14 DIAGNOSIS — M79676 Pain in unspecified toe(s): Secondary | ICD-10-CM

## 2016-02-14 DIAGNOSIS — B351 Tinea unguium: Secondary | ICD-10-CM

## 2016-02-14 NOTE — Progress Notes (Signed)
[name], female   DOB: 04/29/1927, 82 y.o.   MRN: [id number] Complaint:  Visit Type: Patient returns to my office for continued preventative foot care services. Complaint: Patient states" my nails have grown long and thick and become painful to walk and wear shoes" . She presents for preventative foot care services. No changes to ROS  Podiatric Exam: Vascular: dorsalis pedis and posterior tibial pulses are palpable bilateral. Capillary return is immediate. Temperature gradient is WNL. Skin turgor WNL  Sensorium: Normal Semmes Weinstein monofilament test. Normal tactile sensation bilaterally. Nail Exam: Pt has thick disfigured discolored nails with subungual debris noted bilateral entire nail hallux through fifth toenails Ulcer Exam: There is no evidence of ulcer or pre-ulcerative changes or infection. Orthopedic Exam: Muscle tone and strength are WNL. No limitations in general ROM. No crepitus or effusions noted. HAV 1st MPJ  Right foot.. Skin: No Porokeratosis. No infection or ulcers.  Heloma durum 5th toe B/L  Diagnosis:  Tinea unguium, Pain in right toe, pain in left toe,  painful corn fifth toe left foot  Treatment & Plan Procedures and Treatment: Consent by patient was obtained for treatment procedures. The patient understood the discussion of treatment and procedures well. All questions were answered thoroughly reviewed. Debridement of mycotic and hypertrophic toenails, 1 through 5 bilateral and clearing of subungual debris. No ulceration, no infection noted. Debridement fifth toe left foot. Return Visit-Office Procedure: Patient instructed to return to the office for a follow up visit 3 months for continued evaluation and treatment.   [name] 

## 2016-05-14 ENCOUNTER — Ambulatory Visit: Payer: Medicare Other | Admitting: Podiatry

## 2016-05-15 ENCOUNTER — Ambulatory Visit: Payer: Medicare Other | Admitting: Podiatry

## 2016-05-16 ENCOUNTER — Observation Stay (HOSPITAL_COMMUNITY)
Admission: EM | Admit: 2016-05-16 | Discharge: 2016-05-17 | Disposition: A | Discharge status: Home / Self Care | Payer: Medicare Other | Attending: Internal Medicine | Admitting: Internal Medicine

## 2016-05-16 ENCOUNTER — Emergency Department (HOSPITAL_COMMUNITY): Payer: Medicare Other

## 2016-05-16 ENCOUNTER — Encounter (HOSPITAL_COMMUNITY): Payer: Self-pay | Admitting: Nurse Practitioner

## 2016-05-16 DIAGNOSIS — R4701 Aphasia: Secondary | ICD-10-CM | POA: Diagnosis not present

## 2016-05-16 DIAGNOSIS — B9620 Unspecified Escherichia coli [E. coli] as the cause of diseases classified elsewhere: Secondary | ICD-10-CM | POA: Insufficient documentation

## 2016-05-16 DIAGNOSIS — R2681 Unsteadiness on feet: Secondary | ICD-10-CM | POA: Insufficient documentation

## 2016-05-16 DIAGNOSIS — N390 Urinary tract infection, site not specified: Secondary | ICD-10-CM | POA: Insufficient documentation

## 2016-05-16 DIAGNOSIS — I10 Essential (primary) hypertension: Secondary | ICD-10-CM | POA: Diagnosis not present

## 2016-05-16 DIAGNOSIS — Z79899 Other long term (current) drug therapy: Secondary | ICD-10-CM | POA: Insufficient documentation

## 2016-05-16 DIAGNOSIS — Z7902 Long term (current) use of antithrombotics/antiplatelets: Secondary | ICD-10-CM | POA: Diagnosis not present

## 2016-05-16 DIAGNOSIS — E785 Hyperlipidemia, unspecified: Secondary | ICD-10-CM | POA: Insufficient documentation

## 2016-05-16 DIAGNOSIS — G459 Transient cerebral ischemic attack, unspecified: Secondary | ICD-10-CM | POA: Diagnosis present

## 2016-05-16 DIAGNOSIS — G451 Carotid artery syndrome (hemispheric): Principal | ICD-10-CM | POA: Insufficient documentation

## 2016-05-16 LAB — DIFFERENTIAL
BASOS PCT: 0 %
Basophils Absolute: 0 10*3/uL (ref 0.0–0.1)
Eosinophils Absolute: 0.1 10*3/uL (ref 0.0–0.7)
Eosinophils Relative: 1 %
Lymphocytes Relative: 23 %
Lymphs Abs: 1.5 10*3/uL (ref 0.7–4.0)
MONO ABS: 0.5 10*3/uL (ref 0.1–1.0)
MONOS PCT: 7 %
NEUTROS ABS: 4.5 10*3/uL (ref 1.7–7.7)
Neutrophils Relative %: 69 %

## 2016-05-16 LAB — I-STAT CHEM 8, ED
BUN: 18 mg/dL (ref 6–20)
CALCIUM ION: 1.18 mmol/L (ref 1.15–1.40)
Chloride: 102 mmol/L (ref 101–111)
Creatinine, Ser: 0.8 mg/dL (ref 0.44–1.00)
Glucose, Bld: 100 mg/dL — ABNORMAL HIGH (ref 65–99)
HCT: 39 % (ref 36.0–46.0)
Hemoglobin: 13.3 g/dL (ref 12.0–15.0)
Potassium: 3.9 mmol/L (ref 3.5–5.1)
SODIUM: 139 mmol/L (ref 135–145)
TCO2: 29 mmol/L (ref 0–100)

## 2016-05-16 LAB — CBC
HEMATOCRIT: 38.4 % (ref 36.0–46.0)
Hemoglobin: 12.5 g/dL (ref 12.0–15.0)
MCH: 31.1 pg (ref 26.0–34.0)
MCHC: 32.6 g/dL (ref 30.0–36.0)
MCV: 95.5 fL (ref 78.0–100.0)
Platelets: 225 10*3/uL (ref 150–400)
RBC: 4.02 MIL/uL (ref 3.87–5.11)
RDW: 12.4 % (ref 11.5–15.5)
WBC: 6.5 10*3/uL (ref 4.0–10.5)

## 2016-05-16 LAB — URINALYSIS, ROUTINE W REFLEX MICROSCOPIC
Bilirubin Urine: NEGATIVE
GLUCOSE, UA: NEGATIVE mg/dL
Ketones, ur: NEGATIVE mg/dL
NITRITE: POSITIVE — AB
Protein, ur: NEGATIVE mg/dL
Specific Gravity, Urine: 1.01 (ref 1.005–1.030)
pH: 6 (ref 5.0–8.0)

## 2016-05-16 LAB — COMPREHENSIVE METABOLIC PANEL
ALK PHOS: 74 U/L (ref 38–126)
ALT: 16 U/L (ref 14–54)
AST: 49 U/L — ABNORMAL HIGH (ref 15–41)
Albumin: 4.2 g/dL (ref 3.5–5.0)
Anion gap: 10 (ref 5–15)
BUN: 15 mg/dL (ref 6–20)
CALCIUM: 9.6 mg/dL (ref 8.9–10.3)
CHLORIDE: 101 mmol/L (ref 101–111)
CO2: 26 mmol/L (ref 22–32)
CREATININE: 0.84 mg/dL (ref 0.44–1.00)
Glucose, Bld: 106 mg/dL — ABNORMAL HIGH (ref 65–99)
Potassium: 3.9 mmol/L (ref 3.5–5.1)
Sodium: 137 mmol/L (ref 135–145)
Total Bilirubin: 0.5 mg/dL (ref 0.3–1.2)
Total Protein: 8.3 g/dL — ABNORMAL HIGH (ref 6.5–8.1)

## 2016-05-16 LAB — CBG MONITORING, ED: Glucose-Capillary: 93 mg/dL (ref 65–99)

## 2016-05-16 LAB — APTT: aPTT: 41 seconds — ABNORMAL HIGH (ref 24–36)

## 2016-05-16 LAB — PROTIME-INR
INR: 0.96
Prothrombin Time: 12.8 seconds (ref 11.4–15.2)

## 2016-05-16 MED ORDER — LORAZEPAM 2 MG/ML IJ SOLN: 0.5000 mg | Freq: Once | INTRAMUSCULAR | Status: DC | PRN

## 2016-05-16 NOTE — ED Notes (Signed)
attempted report x1 

## 2016-05-16 NOTE — ED Notes (Signed)
IV intact. IV site appears to be bruised. Positive for blood return and flushes with ease.

## 2016-05-16 NOTE — ED Triage Notes (Addendum)
Pt presents with c/o stroke symptoms began today. She was talking on the phone with her sister and suddenly could not get her words out. This lasted for about 3 minutes then resolved. She states she still doesn't really feel well but does not have any dizziness, speech difficulty, unilateral weakness, facial droop, arm drift. She reports strong smelling urine and malaise for the past 2 days. She took 4 baby aspirin prior to arrival

## 2016-05-16 NOTE — ED Provider Notes (Signed)
[facility] DEPT Provider Note   CSN: [id number] Arrival date & time: 05/16/16  1712     History   Chief Complaint Chief Complaint  Patient presents with  . Stroke Symptoms    HPI Britley Chio is a 83 y.o. female.  Patient is an 58 are old female who has no medical problems and lives independently presents today with a 5 minute episode of a seizure that occurred at 9 AM this morning when she was talking to her sister. Patient states she could understand everything her sister said to her by her speech was garbled. This has since resolved. She denied any unilateral weakness or numbness. No visual changes or difficulty walking during this time. She has no prior history of stroke. She also notes over the last few days she's had some dysuria and decreased appetite but denies any abdominal pain, nausea, vomiting or weakness.   The history is provided by the patient.    History reviewed. No pertinent past medical history.  There are no active problems to display for this patient.   Past Surgical History:  Procedure Laterality Date  . ABDOMINAL HYSTERECTOMY      OB History    No data available       Home Medications    Prior to Admission medications   Medication Sig Start Date End Date Taking? Authorizing Provider  amLODipine (NORVASC) 5 MG tablet  08/29/15   Historical Provider, MD  Dabigatran Etexilate Mesylate (PRADAXA PO) Take by mouth 2 (two) times daily.    Historical Provider, MD    Family History History reviewed. No pertinent family history.  Social History Social History  Substance Use Topics  . Smoking status: Never Smoker  . Smokeless tobacco: Never Used  . Alcohol use No     Allergies   Patient has no known allergies.   Review of Systems Review of Systems  All other systems reviewed and are negative.    Physical Exam Updated Vital Signs BP 184/69   Pulse 90   Temp 98.3 F (36.8 C) (Oral)   Resp 18   Ht 5\' 5"  (1.651 m)   Wt 120 lb  (54.4 kg)   SpO2 100%   BMI 19.97 kg/m   Physical Exam  Constitutional: She is oriented to person, place, and time. She appears well-developed and well-nourished. No distress.  HENT:  Head: Normocephalic and atraumatic.  Mouth/Throat: Oropharynx is clear and moist.  Eyes: Conjunctivae and EOM are normal. Pupils are equal, round, and reactive to light.  Neck: Normal range of motion. Neck supple. Carotid bruit is not present.  Cardiovascular: Normal rate, regular rhythm and intact distal pulses.   No murmur heard. Pulmonary/Chest: Effort normal and breath sounds normal. No respiratory distress. She has no wheezes. She has no rales.  Abdominal: Soft. She exhibits no distension. There is no tenderness. There is no rebound and no guarding.  Musculoskeletal: Normal range of motion. She exhibits no edema or tenderness.  Neurological: She is alert and oriented to person, place, and time. She has normal strength. No cranial nerve deficit or sensory deficit. Coordination and gait normal.  Speech is normal without slurring or aphasia.  No pronator drift.  Visual fields intact  Skin: Skin is warm and dry. No rash noted. No erythema.  Psychiatric: She has a normal mood and affect. Her behavior is normal.  Nursing note and vitals reviewed.    ED Treatments / Results  Labs (all labs ordered are listed, but only abnormal results are  displayed) Labs Reviewed  APTT - Abnormal; Notable for the following:       Result Value   aPTT 41 (*)    All other components within normal limits  COMPREHENSIVE METABOLIC PANEL - Abnormal; Notable for the following:    Glucose, Bld 106 (*)    Total Protein 8.3 (*)    AST 49 (*)    All other components within normal limits  I-STAT CHEM 8, ED - Abnormal; Notable for the following:    Glucose, Bld 100 (*)    All other components within normal limits  PROTIME-INR  CBC  DIFFERENTIAL  URINALYSIS, ROUTINE W REFLEX MICROSCOPIC  I-STAT TROPOININ, ED  CBG  MONITORING, ED    EKG  EKG Interpretation  Date/Time:  Friday May 16 2016 17:49:43 EST Ventricular Rate:  85 PR Interval:  124 QRS Duration: 70 QT Interval:  370 QTC Calculation: 440 R Axis:   60 Text Interpretation:  Normal sinus rhythm Possible Left atrial enlargement ST & T wave abnormality, consider inferior ischemia No previous tracing Confirmed by Maryan Rued  MD, Loree Fee ([id number]) on 05/16/2016 7:39:56 PM       Radiology Ct Head Wo Contrast  Result Date: 05/16/2016 CLINICAL DATA:  Acute onset of transient aphasia while on telephone with sister. Initial encounter. EXAM: CT HEAD WITHOUT CONTRAST TECHNIQUE: Contiguous axial images were obtained from the base of the skull through the vertex without intravenous contrast. COMPARISON:  None. FINDINGS: Brain: No evidence of acute infarction, hemorrhage, hydrocephalus, extra-axial collection or mass lesion/mass effect. Prominence of the ventricles suggests mild cortical volume loss. Scattered periventricular and subcortical white matter change likely reflects small vessel ischemic microangiopathy. The brainstem and fourth ventricle are within normal limits. The basal ganglia are unremarkable in appearance. The cerebral hemispheres demonstrate grossly normal gray-white differentiation. No mass effect or midline shift is seen. Vascular: No hyperdense vessel or unexpected calcification. Skull: There is no evidence of fracture; visualized osseous structures are unremarkable in appearance. Sinuses/Orbits: The orbits are within normal limits. Mucoperiosteal thickening is noted at the left maxillary sinus. The remaining paranasal sinuses and mastoid air cells are well-aerated. Other: No significant soft tissue abnormalities are seen. IMPRESSION: 1. No acute intracranial pathology seen on CT. 2. Mild cortical volume loss and scattered small vessel ischemic microangiopathy. 3. Mucoperiosteal thickening at the left maxillary sinus. (noted in the Electronically  Signed   By: Garald Balding M.D.   On: 05/16/2016 18:53    Procedures Procedures (including critical care time)  Medications Ordered in ED Medications - No data to display   Initial Impression / Assessment and Plan / ED Course  I have reviewed the triage vital signs and the nursing notes.  Pertinent labs & imaging results that were available during my care of the patient were reviewed by me and considered in my medical decision making (see chart for details).    Patient is an 83 y/o female presenting today with symptoms concerning for a TIA. No prior symptoms similar to this. Currently asymptomatic.  No evidence of atrial fibrillation on monitor EKG. Patient has no carotid bruits. CT negative for acute pathology. Labs without significant findings. UA still pending as patient is complaining of dysuria. Feel that patient needs admission for TIA workup  Final Clinical Impressions(s) / ED Diagnoses   Final diagnoses:  Transient cerebral ischemia, unspecified type    New Prescriptions New Prescriptions   No medications on file     Blanchie Dessert, MD 05/16/16 2036

## 2016-05-16 NOTE — Consult Note (Signed)
NEURO HOSPITALIST CONSULT NOTE   Requestig physician: Dr. Loleta Books  Reason for Consult: Transient dysphasia  History obtained from:  Patient and Chart     HPI:                                                                                                                                          Kimberly Ramirez is an 83 y.o. female who presents with a 5 minute episode of speech dysfunction. Symptoms began while talking on the phone with her sister at 51 AM today. All of a sudden, she "could not get her words out". The episode lasted approximately 3 minutes. The patient recalls being able to understand what her sister was saying to her. No associated weakness, vision changes, gait instability or numbness. BP in the ED was 184/69. CT head was unremarkable for her age. EKG without atrial fibrillation. She has no prior history of stroke or MI. She states that she took four 81 mg ASA tablets prior to arrival.  PMHx - not available in EPIC or Care Everywhere  Past Surgical History:  Procedure Laterality Date  . ABDOMINAL HYSTERECTOMY      Family History  Problem Relation Age of Onset  . Parkinson's disease Mother   . Heart Problems Father    Social History:  reports that she has never smoked. She has never used smokeless tobacco. She reports that she does not drink alcohol or use drugs.  No Known Allergies  MEDICATIONS:                                                                                                                     Scheduled: .  stroke: mapping our early stages of recovery book   Does not apply Once  . aspirin  300 mg Rectal Daily   Or  . aspirin  325 mg Oral Daily  . enoxaparin (LOVENOX) injection  40 mg Subcutaneous Daily     ROS:  History obtained from patient. No headache, chest pain, vision changes, abdominal pain  or SOB. States she had some neck pain during her spell. No focal weakness or numbness. Other ROS as per HPI.   Blood pressure (!) 147/74, pulse 84, temperature 97.5 F (36.4 C), temperature source Oral, resp. rate 20, height 5\' 5"  (1.651 m), weight 55 kg (121 lb 4.1 oz), SpO2 99 %.   General Examination:                                                                                                      HEENT-  Normocephalic/atraumatic. Lungs- No gross wheezing. Respirations unlabored.  Extremities- Warm and well perfused.   Neurological Examination Mental Status: Alert and fully oriented. Thought content appropriate.  Speech fluent with intact repetition, naming and comprehension. Able to follow all commands without difficulty. Cranial Nerves: II: Visual fields intact to bedside confrontation. PERRL. III,IV, VI: ptosis not present, EOMI.  V,VII: smile symmetric, facial temperature sensation normal bilaterally VIII: hearing intact to conversation IX,X: no hoarseness or hypophonia XI: bilateral shoulder shrug is symmetric XII: midline tongue extension Motor: Right : Upper extremity   5/5    Left:     Upper extremity   5/5  Lower extremity   5/5     Lower extremity   5/5 No pronator drift.   Sensory: Temperature and light touch intact in proximal upper and lower extremities. No extinction noted.  Deep Tendon Reflexes: 2+ brachioradialis, biceps and patellae bilaterally. 1+ achilles bilaterally. Right toe upgoing, left toe downgoing.  Cerebellar: Normal FNF bilaterally.  Gait: Deferred  Lab Results: Basic Metabolic Panel:  Recent Labs Lab 05/16/16 1812 05/16/16 1827  NA 137 139  K 3.9 3.9  CL 101 102  CO2 26  --   GLUCOSE 106* 100*  BUN 15 18  CREATININE 0.84 0.80  CALCIUM 9.6  --     Liver Function Tests:  Recent Labs Lab 05/16/16 1812  AST 49*  ALT 16  ALKPHOS 74  BILITOT 0.5  PROT 8.3*  ALBUMIN 4.2   No results for input(s): LIPASE, AMYLASE in the last 168  hours. No results for input(s): AMMONIA in the last 168 hours.  CBC:  Recent Labs Lab 05/16/16 1812 05/16/16 1827  WBC 6.5  --   NEUTROABS 4.5  --   HGB 12.5 13.3  HCT 38.4 39.0  MCV 95.5  --   PLT 225  --     Cardiac Enzymes: No results for input(s): CKTOTAL, CKMB, CKMBINDEX, TROPONINI in the last 168 hours.  Lipid Panel: No results for input(s): CHOL, TRIG, HDL, CHOLHDL, VLDL, LDLCALC in the last 168 hours.  CBG:  Recent Labs Lab 05/16/16 1935  GLUCAP 93    Microbiology: No results found for this or any previous visit.  Coagulation Studies:  Recent Labs  05/16/16 1812  LABPROT 12.8  INR 0.96    Imaging: Ct Head Wo Contrast  Result Date: 05/16/2016 CLINICAL DATA:  Acute onset of transient aphasia while on telephone with sister. Initial encounter. EXAM: CT HEAD WITHOUT CONTRAST TECHNIQUE: Contiguous  axial images were obtained from the base of the skull through the vertex without intravenous contrast. COMPARISON:  None. FINDINGS: Brain: No evidence of acute infarction, hemorrhage, hydrocephalus, extra-axial collection or mass lesion/mass effect. Prominence of the ventricles suggests mild cortical volume loss. Scattered periventricular and subcortical white matter change likely reflects small vessel ischemic microangiopathy. The brainstem and fourth ventricle are within normal limits. The basal ganglia are unremarkable in appearance. The cerebral hemispheres demonstrate grossly normal gray-white differentiation. No mass effect or midline shift is seen. Vascular: No hyperdense vessel or unexpected calcification. Skull: There is no evidence of fracture; visualized osseous structures are unremarkable in appearance. Sinuses/Orbits: The orbits are within normal limits. Mucoperiosteal thickening is noted at the left maxillary sinus. The remaining paranasal sinuses and mastoid air cells are well-aerated. Other: No significant soft tissue abnormalities are seen. IMPRESSION: 1. No  acute intracranial pathology seen on CT. 2. Mild cortical volume loss and scattered small vessel ischemic microangiopathy. 3. Mucoperiosteal thickening at the left maxillary sinus. (noted in the Electronically Signed   By: Garald Balding M.D.   On: 05/16/2016 18:53    Assessment: 1. Five minute episode of speech dysfunction. Described symptoms are suggestive of TIA affecting Broca's area (left frontal operculum).  2. CT head negative for acute abnormality. Mild atrophy and chronic small vessel ischemic changes are noted.   Recommendations: 1. Stroke work up to include MRI brain, MRA head, carotid ultrasound and TTE.  2. Continue ASA 325 mg po qd 3. Lipid panel and HgbA1c 4. Given her advanced age, overall benefits of statin likely outweighed by risks.  5. PT/OT/Speech.  6. Permissive HTN x 24 hours.  7. Telemetry.   Electronically signed: Dr. Kerney Elbe 05/16/2016, 10:24 PM

## 2016-05-16 NOTE — ED Notes (Signed)
Pt ambulatory to the bathroom with assistance.

## 2016-05-16 NOTE — H&P (Signed)
History and Physical  Patient Name: Kimberly Ramirez     [id number]    DOB: 08-09-1927    DOA: 05/16/2016 PCP: Mathews Argyle, MD   Patient coming from: Home     Chief Complaint: Speech disturbance  HPI: Kimberly Ramirez is a 83 y.o. female with no significant past medical history who presents with transient speech disturbance.  The patient was in her usual state of health until this afternoon, she was on the phone with her sister long-distance when she suddenly "couldn't speak right" and the words were garbled.  This lasted less than 5 minutes, resolved spontaneously, and she got off the phone and called her daughter who brought her to the ER.  There was no weakness, numbness accompanying.    ED course: -Afebrile, heart rate 90, respirations and pulse ox normal, BP 184/69 -Na 137, K 3.9, Cr 0.84, WBC 6.5K, Hgb 12.5 -INR normal, PTT slightly elevated -CT head without contrast unremarkable for age -ECG showed sinus rhythm, nonspecific TW changes only -TRH were asked to evaluate for TIA     Review of systems:  Review of Systems  Genitourinary: Positive for dysuria and urgency.  Neurological: Positive for speech change. Negative for dizziness, tingling, tremors, sensory change, focal weakness, seizures, loss of consciousness and headaches.  All other systems reviewed and are negative.        History reviewed. No pertinent past medical history.  Past Surgical History:  Procedure Laterality Date  . ABDOMINAL HYSTERECTOMY      Social History: Patient lives alone.  Patient walks unassisted.  She still drives limited distances.  She does not smoke.  She is from [place] originally, was a homemaker, moved here to [place] about 10 years ago to be near her daughter.    No Known Allergies  Family history: family history includes Heart Problems in her father; Parkinson's disease in her mother.  Prior to Admission medications   None     Physical Exam: BP 168/70   Pulse 79    Temp 97.8 F (36.6 C)   Resp 19   Ht 5\' 5"  (1.651 m)   Wt 54.4 kg (120 lb)   SpO2 99%   BMI 19.97 kg/m  General appearance: Thin elderly adult female, alert and in no acute distress.   Eyes: Anicteric, conjunctiva pink, lids and lashes normal. PERRL.    ENT: No nasal deformity, discharge, epistaxis.  Hearing normal. OP moist without lesions.   Dentition normal. Lymph: No cervical, supraclavicular or axillary lymphadenopathy. Skin: Warm and dry.  No jaundice.  No suspicious rashes or lesions. Cardiac: RRR, nl S1-S2, no murmurs appreciated.  Capillary refill is brisk.  JVP normal.  No LE edema.  Radial and DP pulses 2+ and symmetric.  No carotid bruits. Respiratory: Normal respiratory rate and rhythm.  CTAB without rales or wheezes. GI: Abdomen soft without rigidity.  No TTP. No ascites, distension, no hepatosplenomegaly.   MSK: No deformities or effusions. Neuro: Pupils are 4 mm and reactive to 3 mm. Extraocular movements are intact, without nystagmus. Cranial nerve 5 is within normal limits. Cranial nerve 7 is symmetrical. Cranial nerve 8 is within normal limits. Cranial nerves 9 and 10 reveal equal palate elevation. Cranial nerve 11 reveals sternocleidomastoid strong. Cranial nerve 12 is midline. I do not note a deficit in motor strength testing in the upper and lower extremities bilaterally with normal motor, tone and bulk. Romberg maneuver is negative for pathology. Finger-to-nose testing is within normal limits. Speech is fluent. Naming is grossly  intact. Attention span and concentration are within normal limits.   Psych: The patient is oriented to time, place and person. Behavior appropriate.  Affect normal.  Recall, recent and remote, as well as general fund of knowledge seem within normal limits. No evidence of aural or visual hallucinations or delusions.       Labs on Admission:  I have personally reviewed following labs and imaging studies: CBC:  Recent Labs Lab  05/16/16 1812 05/16/16 1827  WBC 6.5  --   NEUTROABS 4.5  --   HGB 12.5 13.3  HCT 38.4 39.0  MCV 95.5  --   PLT 225  --    Basic Metabolic Panel:  Recent Labs Lab 05/16/16 1812 05/16/16 1827  NA 137 139  K 3.9 3.9  CL 101 102  CO2 26  --   GLUCOSE 106* 100*  BUN 15 18  CREATININE 0.84 0.80  CALCIUM 9.6  --    GFR: Estimated Creatinine Clearance: 43.4 mL/min (by C-G formula based on SCr of 0.8 mg/dL). Liver Function Tests:  Recent Labs Lab 05/16/16 1812  AST 49*  ALT 16  ALKPHOS 74  BILITOT 0.5  PROT 8.3*  ALBUMIN 4.2   No results for input(s): LIPASE, AMYLASE in the last 168 hours. No results for input(s): AMMONIA in the last 168 hours. Coagulation Profile:  Recent Labs Lab 05/16/16 1812  INR 0.96   Cardiac Enzymes: No results for input(s): CKTOTAL, CKMB, CKMBINDEX, TROPONINI in the last 168 hours. BNP (last 3 results) No results for input(s): PROBNP in the last 8760 hours. HbA1C: No results for input(s): HGBA1C in the last 72 hours. CBG:  Recent Labs Lab 05/16/16 1935  GLUCAP 93   Lipid Profile: No results for input(s): CHOL, HDL, LDLCALC, TRIG, CHOLHDL, LDLDIRECT in the last 72 hours. Thyroid Function Tests: No results for input(s): TSH, T4TOTAL, FREET4, T3FREE, THYROIDAB in the last 72 hours. Anemia Panel: No results for input(s): VITAMINB12, FOLATE, FERRITIN, TIBC, IRON, RETICCTPCT in the last 72 hours. Sepsis Labs: Invalid input(s): PROCALCITONIN, LACTICIDVEN No results found for this or any previous visit (from the past 240 hour(s)).    Radiological Exams on Admission: Personally reviewed CT report: Ct Head Wo Contrast  Result Date: 05/16/2016 CLINICAL DATA:  Acute onset of transient aphasia while on telephone with sister. Initial encounter. EXAM: CT HEAD WITHOUT CONTRAST TECHNIQUE: Contiguous axial images were obtained from the base of the skull through the vertex without intravenous contrast. COMPARISON:  None. FINDINGS: Brain: No  evidence of acute infarction, hemorrhage, hydrocephalus, extra-axial collection or mass lesion/mass effect. Prominence of the ventricles suggests mild cortical volume loss. Scattered periventricular and subcortical white matter change likely reflects small vessel ischemic microangiopathy. The brainstem and fourth ventricle are within normal limits. The basal ganglia are unremarkable in appearance. The cerebral hemispheres demonstrate grossly normal gray-white differentiation. No mass effect or midline shift is seen. Vascular: No hyperdense vessel or unexpected calcification. Skull: There is no evidence of fracture; visualized osseous structures are unremarkable in appearance. Sinuses/Orbits: The orbits are within normal limits. Mucoperiosteal thickening is noted at the left maxillary sinus. The remaining paranasal sinuses and mastoid air cells are well-aerated. Other: No significant soft tissue abnormalities are seen. IMPRESSION: 1. No acute intracranial pathology seen on CT. 2. Mild cortical volume loss and scattered small vessel ischemic microangiopathy. 3. Mucoperiosteal thickening at the left maxillary sinus. (noted in the Electronically Signed   By: Garald Balding M.D.   On: 05/16/2016 18:53     EKG: Independently  reviewed. Rate 85, QTc 446, no ST changes.    Assessment/Plan  1. Acute TIA:  This is new.  ABCD 3.  Will place in observation for expedited workup given weekend. -Admit to telemetry -Neuro checks, NIHSS per protocol -Daily aspirin 325 mg -Check lipids, hemoglobin A1c -MRI/MRA brain and head ordered -Carotid dopplers ordered -Echocardiogram ordered -PT/OT/SLP consultation -Consult to Neurology, appreciate recommendations   2. Hypertension:  This is new.   -Trend overnight           DVT prophylaxis: Lovenox  Code Status: FULL  Family Communication: Daughter at bedside  Disposition Plan: Anticipate Stroke work up as above and consult to ancillary services.  Expect  discharge within 1 day. Consults called: Neurology, Dr. Cheral Marker will evaluate the patient Admission status: Telemetry, OBS status  Core measures: -VTE prophylaxis ordered at time of admission -Aspirin ordered at admission -Atrial fibrillation: not present -tPA not given because of symptoms resolved -Dysphagia screen ordered in ER -Lipids ordered -PT eval ordered -Non-smoker    Medical decision making: Patient seen at 9:02 PM on 05/16/2016.  The patient was discussed with Dr. Maryan Rued and Dr. Cheral Marker. What exists of the patient's chart was reviewed in depth and summarized above.  Clinical condition: stable.       Edwin Dada [facility] Pager [phone]

## 2016-05-17 ENCOUNTER — Observation Stay (HOSPITAL_BASED_OUTPATIENT_CLINIC_OR_DEPARTMENT_OTHER): Payer: Medicare Other

## 2016-05-17 ENCOUNTER — Observation Stay (HOSPITAL_COMMUNITY): Payer: Medicare Other

## 2016-05-17 DIAGNOSIS — G451 Carotid artery syndrome (hemispheric): Principal | ICD-10-CM

## 2016-05-17 DIAGNOSIS — G459 Transient cerebral ischemic attack, unspecified: Secondary | ICD-10-CM

## 2016-05-17 DIAGNOSIS — R4701 Aphasia: Secondary | ICD-10-CM | POA: Diagnosis not present

## 2016-05-17 LAB — LIPID PANEL
CHOL/HDL RATIO: 4.9 ratio
Cholesterol: 276 mg/dL — ABNORMAL HIGH (ref 0–200)
HDL: 56 mg/dL (ref >40)
LDL Cholesterol: 186 mg/dL — ABNORMAL HIGH (ref 0–99)
Triglycerides: 172 mg/dL — ABNORMAL HIGH (ref <150)
VLDL: 34 mg/dL (ref 0–40)

## 2016-05-17 LAB — URINALYSIS, ROUTINE W REFLEX MICROSCOPIC
BILIRUBIN URINE: NEGATIVE
GLUCOSE, UA: NEGATIVE mg/dL
HGB URINE DIPSTICK: NEGATIVE
Ketones, ur: NEGATIVE mg/dL
NITRITE: POSITIVE — AB
PROTEIN: NEGATIVE mg/dL
Specific Gravity, Urine: 1.006 (ref 1.005–1.030)
Squamous Epithelial / LPF: NONE SEEN
pH: 8 (ref 5.0–8.0)

## 2016-05-17 LAB — ECHOCARDIOGRAM COMPLETE
HEIGHTINCHES: 65 in
WEIGHTICAEL: 1940.05 [oz_av]

## 2016-05-17 MED ORDER — LEVOFLOXACIN 500 MG PO TABS: 500.0000 mg | ORAL_TABLET | Freq: Every day | ORAL | 0 refills | Status: DC

## 2016-05-17 MED ORDER — ATORVASTATIN CALCIUM 40 MG PO TABS: 40.0000 mg | ORAL_TABLET | Freq: Every day | ORAL | 1 refills | Status: DC

## 2016-05-17 MED ORDER — ACETAMINOPHEN 325 MG PO TABS: 650.0000 mg | ORAL_TABLET | ORAL | Status: DC | PRN

## 2016-05-17 MED ORDER — ACETAMINOPHEN 650 MG RE SUPP: 650.0000 mg | RECTAL | Status: DC | PRN

## 2016-05-17 MED ORDER — ASPIRIN 325 MG PO TABS: 325.0000 mg | ORAL_TABLET | Freq: Every day | ORAL | Status: DC

## 2016-05-17 MED ORDER — ASPIRIN 325 MG PO TABS
325.0000 mg | ORAL_TABLET | Freq: Every day | ORAL | Status: DC
  Administered 2016-05-17: 325 mg via ORAL
  Filled 2016-05-17: qty 1

## 2016-05-17 MED ORDER — LEVOFLOXACIN 500 MG PO TABS
500.0000 mg | ORAL_TABLET | ORAL | Status: DC
  Administered 2016-05-17: 500 mg via ORAL
  Filled 2016-05-17: qty 1

## 2016-05-17 MED ORDER — ATORVASTATIN CALCIUM 40 MG PO TABS
40.0000 mg | ORAL_TABLET | Freq: Every day | ORAL | Status: DC
  Administered 2016-05-17: 40 mg via ORAL
  Filled 2016-05-17 (×2): qty 1

## 2016-05-17 MED ORDER — ASPIRIN 300 MG RE SUPP: 300.0000 mg | Freq: Every day | RECTAL | Status: DC

## 2016-05-17 MED ORDER — ACETAMINOPHEN 160 MG/5ML PO SOLN: 650.0000 mg | ORAL | Status: DC | PRN

## 2016-05-17 MED ORDER — STROKE: EARLY STAGES OF RECOVERY BOOK: Freq: Once | Status: DC

## 2016-05-17 MED ORDER — ENOXAPARIN SODIUM 40 MG/0.4ML ~~LOC~~ SOLN
40.0000 mg | Freq: Every day | SUBCUTANEOUS | Status: DC
  Administered 2016-05-17: 40 mg via SUBCUTANEOUS
  Filled 2016-05-17: qty 0.4

## 2016-05-17 NOTE — Discharge Instructions (Signed)
Transient Ischemic Attack °A transient ischemic attack (TIA) is a "warning stroke" that causes stroke-like symptoms. A TIA does not cause lasting damage to the brain. The symptoms of a TIA can happen fast and do not last long. It is important to know the symptoms of a TIA and what to do. This can help prevent stroke or death. °Follow these instructions at home: °· Take medicines only as told by your doctor. Make sure you understand all of the instructions. °· You may need to take aspirin or warfarin medicine. Warfarin needs to be taken exactly as told. °¨ Taking too much or too little warfarin is dangerous. Blood tests must be done as often as told by your doctor. A PT blood test measures how long it takes for blood to clot. Your PT is used to calculate another value called an INR. Your PT and INR help your doctor adjust your warfarin dosage. He or she will make sure you are taking the right amount. °¨ Food can cause problems with warfarin and affect the results of your blood tests. This is true for foods high in vitamin K. Eat the same amount of foods high in vitamin K each day. Foods high in vitamin K include spinach, kale, broccoli, cabbage, collard and turnip greens, Brussels sprouts, peas, cauliflower, seaweed, and parsley. Other foods high in vitamin K include beef and pork liver, green tea, and soybean oil. Eat the same amount of foods high in vitamin K each day. Avoid big changes in your diet. Tell your doctor before changing your diet. Talk to a food specialist (dietitian) if you have questions. °¨ Many medicines can cause problems with warfarin and affect your PT and INR. Tell your doctor about all medicines you take. This includes vitamins and dietary pills (supplements). Do not take or stop taking any prescribed or over-the-counter medicines unless your doctor tells you to. °¨ Warfarin can cause more bruising or bleeding. Hold pressure over any cuts for longer than normal. Talk to your doctor about other  side effects of warfarin. °¨ Avoid sports or activities that may cause injury or bleeding. °¨ Be careful when you shave, floss, or use sharp objects. °¨ Avoid or drink very little alcohol while taking warfarin. Tell your doctor if you change how much alcohol you drink. °¨ Tell your dentist and other doctors that you take warfarin before any procedures. °· Follow your diet program as told, if you are given one. °· Keep a healthy weight. °· Stay active. Try to get at least 30 minutes of activity on all or most days. °· Do not use any tobacco products, including cigarettes, chewing tobacco, or electronic cigarettes. If you need help quitting, ask your doctor. °· Limit alcohol intake to no more than 1 drink per day for nonpregnant women and 2 drinks per day for men. One drink equals 12 ounces of beer, 5 ounces of wine, or 1½ ounces of hard liquor. °· Do not abuse drugs. °· Keep your home safe so you do not fall. You can do this by: °¨ Putting grab bars in the bedroom and bathroom. °¨ Raising toilet seats. °¨ Putting a seat in the shower. °· Keep all follow-up visits as told by your doctor. This is important. °Contact a doctor if: °· Your personality changes. °· You have trouble swallowing. °· You have double vision. °· You are dizzy. °· You have a fever. °Get help right away if: °These symptoms may be an emergency. Do not wait to see if   the symptoms will go away. Get medical help right away. Call your local emergency services (911 in the [place] Do not drive yourself to the hospital. °· You have sudden weakness or lose feeling (go numb), especially on one side of the body. This can affect your: °¨ Face. °¨ Arm. °¨ Leg. °· You have sudden trouble walking. °· You have sudden trouble moving your arms or legs. °· You have sudden confusion. °· You have trouble talking. °· You have trouble understanding. °· You have sudden trouble seeing in one or both eyes. °· You lose your balance. °· Your movements are not smooth. °· You  have a sudden, very bad headache with no known cause. °· You have new chest pain. °· Your heartbeat is unsteady. °· You are partly or totally unaware of what is going on around you. °This information is not intended to replace advice given to you by your health care provider. Make sure you discuss any questions you have with your health care provider. °Document Released: 01/08/2008 Document Revised: 12/03/2015 Document Reviewed: 07/06/2013 °Elsevier Interactive Patient Education © 2017 [employer] ° °

## 2016-05-17 NOTE — Progress Notes (Signed)
STROKE TEAM PROGRESS NOTE   HISTORY OF PRESENT ILLNESS (per record) Kimberly Ramirez is an 83 y.o. female who presents with a 5 minute episode of speech dysfunction. Symptoms began while talking on the phone with her sister at 4 AM today. All of a sudden, she "could not get her words out". The episode lasted approximately 3 minutes. The patient recalls being able to understand what her sister was saying to her. No associated weakness, vision changes, gait instability or numbness. BP in the ED was 184/69. CT head was unremarkable for her age. EKG without atrial fibrillation. She has no prior history of stroke or MI. She states that she took four 81 mg ASA tablets prior to arrival.   SUBJECTIVE (INTERVAL HISTORY) Her daughter and son-in-law were at the bedside.  Overall she feels her condition is completely resolved. She was anxious to go home.  She asked about redness of her fingers on the left hand and reported that she has chronically noticed these changes from time to time.  I discussed statins with the family a agreed that we should address the cholesterol   OBJECTIVE Temp:  [97.5 F (36.4 C)-98.6 F (37 C)] 98.2 F (36.8 C) (02/03 1413) Pulse Rate:  [72-94] 94 (02/03 1413) Cardiac Rhythm: Normal sinus rhythm (02/02 2225) Resp:  [16-20] 16 (02/03 1413) BP: (128-184)/(54-82) 128/82 (02/03 1413) SpO2:  [96 %-100 %] 100 % (02/03 1413) Weight:  [54.4 kg (120 lb)-55 kg (121 lb 4.1 oz)] 55 kg (121 lb 4.1 oz) (02/02 2200)  CBC:   Recent Labs Lab 05/16/16 1812 05/16/16 1827  WBC 6.5  --   NEUTROABS 4.5  --   HGB 12.5 13.3  HCT 38.4 39.0  MCV 95.5  --   PLT 225  --     Basic Metabolic Panel:   Recent Labs Lab 05/16/16 1812 05/16/16 1827  NA 137 139  K 3.9 3.9  CL 101 102  CO2 26  --   GLUCOSE 106* 100*  BUN 15 18  CREATININE 0.84 0.80  CALCIUM 9.6  --     Lipid Panel:     Component Value Date/Time   CHOL 276 (H) 05/17/2016 0337   TRIG 172 (H) 05/17/2016 0337   HDL 56  05/17/2016 0337   CHOLHDL 4.9 05/17/2016 0337   VLDL 34 05/17/2016 0337   LDLCALC 186 (H) 05/17/2016 0337   HgbA1c: No results found for: HGBA1C Urine Drug Screen: No results found for: LABOPIA, COCAINSCRNUR, LABBENZ, AMPHETMU, THCU, LABBARB    IMAGING  Ct Head Wo Contrast 05/16/2016 1. No acute intracranial pathology seen on CT.  2. Mild cortical volume loss and scattered small vessel ischemic microangiopathy.  3. Mucoperiosteal thickening at the left maxillary sinus.      Mr Jodene Nam Head/brain Wo Cm 05/17/2016 MRI HEAD:  No acute intracranial process. Moderate to severe chronic small vessel ischemic disease.  MRA HEAD:  No emergent large vessel occlusion or significant stenosis.     Transthoracic Echocardiogram 05/17/2016 Study Conclusions - Left ventricle: The cavity size was normal. Systolic function was   normal. The estimated ejection fraction was in the range of 60%   to 65%. Wall motion was normal; there were no regional wall   motion abnormalities. Doppler parameters are consistent with   abnormal left ventricular relaxation (grade 1 diastolic   dysfunction). - Aortic valve: There was mild regurgitation. - Mitral valve: Systolic bowing without prolapse. There was mild   regurgitation directed centrally. - Tricuspid valve: There was mild-moderate  regurgitation. - Pulmonary arteries: Systolic pressure was mildly increased. PA   peak pressure: 41 mm Hg (S).   PHYSICAL EXAM HEENT-  Normocephalic/atraumatic. Lungs- No gross wheezing. Respirations unlabored.  Extremities- Warm and well perfused.   Neurological Examination Mental Status: Alert and fully oriented. Thought content appropriate.  Speech fluent with intact repetition, naming and comprehension. Able to follow all commands without difficulty. Cranial Nerves: II: Visual fields intact to bedside confrontation. PERRL. III,IV, VI: ptosis not present, EOMI.  V,VII: smile symmetric, facial temperature sensation  normal bilaterally VIII: hearing intact to conversation IX,X: no hoarseness or hypophonia XI: bilateral shoulder shrug is symmetric XII: midline tongue extension Motor: Right :  Upper extremity   5/5                                      Left:     Upper extremity   5/5             Lower extremity   5/5                                                  Lower extremity   5/5 No pronator drift.   Sensory: Temperature and light touch intact in proximal upper and lower extremities. No extinction noted.  Cerebellar: Normal FNF bilaterally. Slight ataxia on right HS Gait: Deferred  ASSESSMENT/PLAN Ms. Kimberly Ramirez is an 83 y.o. female with no significant past medical history presents with transient speech dysfunction and elevated blood pressure. She did not receive IV t-PA due to resolution of deficits.  Possible TIA:  Dominant   Resultant - resolution of deficits.  MRI - No acute intracranial process  MRA - unremarkable  Carotid Doppler - 1-39% ICA stenosis.  Vertebral artery flow is antegrade.   2D Echo - EF 60-65%. No cardiac source of emboli identified.  LDL - 186  HgbA1c pending; PCP will need to follow this up  VTE prophylaxis - Lovenox Diet Heart Room service appropriate? Yes; Fluid consistency: Thin Diet - low sodium heart healthy  No antithrombotic prior to admission, now on aspirin 325 mg daily  Patient counseled to be compliant with her antithrombotic medications  Ongoing aggressive stroke risk factor management  Therapy recommendations: Home health PT recommended. OT evaluation pending.  Disposition: Pending  Hypertension  Stable  Permissive hypertension (OK if < 220/120) but gradually normalize in 5-7 days  Long-term BP goal normotensive  Hyperlipidemia  Home meds: No lipid lowering medications prior to admission  LDL 186, goal < 70  Now on Lipitor 40 mg daily  Continue statin at discharge  Other Stroke Risk Factors  Advanced age   Hospital day #  0  To contact Stroke Continuity provider, please refer to [email or web address] After hours, contact General Neurology

## 2016-05-17 NOTE — Discharge Summary (Signed)
Physician Discharge Summary  Kimberly Ramirez [id number] DOB: [birth year] DOA: 05/16/2016  PCP: Mathews Argyle, MD  Admit date: 05/16/2016 Discharge date: 05/17/2016  Recommendations for Outpatient Follow-up:  1. Pt will need to follow up with PCP in 1-2 weeks post discharge 2. Please note that following medications have been added to pt's medical list: Aspirin 325 mg PO QD, Lipitor 40 mg PO QD 3. Patient also discharged on Levaquin to complete therapy for UTI, 5 days treatment 4. Please note that urine culture is not complete on discharge but will continue to monitor and alert patient when results are available   Discharge Diagnoses:  Active Problems:   TIA (transient ischemic attack)  Discharge Condition: Stable  Diet recommendation: Heart healthy diet discussed in details   History of present illness:  Pt is 83 y.o. female with no significant past medical history who presented with transient speech disturbance while she was on the phone with her sister and she suddenly could not speak, speech was garbles and the whole episode lasted about 5 minutes. Pt denied similar events in the past.   Hospital Course:  Possible TIA:  Dominant   Resultant - resolution of deficits.  MRI - No acute intracranial process  MRA - unremarkable  Carotid Doppler - 1-39% ICA stenosis. Vertebral artery flow is antegrade.   2D Echo - EF 60-65%. No cardiac source of emboli identified.  LDL - 186, total Cholesterol 276  HgA1C 5.4, no diabetes   No antithrombotic prior to admission, now on aspirin 325 mg daily  Therapy recommendations: Home health PT recommended  Hyperlipidemia  Home meds: No lipid lowering medications prior to admission  LDL 186, goal < 70  Now on Lipitor 40 mg daily  Continue statin at discharge  UTI, E. Coli  Levaquin should be adequate but final urine culture report and sensitivity report pending on discharge     Procedures/Studies:  Ct Head Wo Contrast  05/16/2016 1. No acute intracranial pathology seen on CT.  2. Mild cortical volume loss and scattered small vessel ischemic microangiopathy.  3. Mucoperiosteal thickening at the left maxillary sinus.    Mr Jodene Nam Head/brain Wo Cm 05/17/2016 MRI HEAD:  No acute intracranial process. Moderate to severe chronic small vessel ischemic disease.  MRA HEAD:  No emergent large vessel occlusion or significant stenosis.    Transthoracic Echocardiogram 05/17/2016 Study Conclusions - Left ventricle: The cavity size was normal. Systolic function normal. EF 60 to 65%. - Aortic valve: There was mild regurgitation. - Mitral valve: Systolic bowing without prolapse. There was mild regurgitation directed centrally. - Tricuspid valve: There was mild-moderate regurgitation. - Pulmonary arteries: Systolic pressure was mildly increased. PA peak pressure: 41 mm Hg  Consultations:  Neurology/Stroke team   Antibiotics:  Levaquin upon discharge for 5 days   Discharge Exam: Vitals:   05/17/16 0506 05/17/16 0733  BP: (!) 142/58 (!) 146/54  Pulse: 72 74  Resp: 16 16  Temp: 97.8 F (36.6 C)    Vitals:   05/17/16 0217 05/17/16 0348 05/17/16 0506 05/17/16 0733  BP: (!) 134/54 (!) 154/59 (!) 142/58 (!) 146/54  Pulse: 74 73 72 74  Resp: 18 16 16 16   Temp: 98.6 F (37 C)  97.8 F (36.6 C)   TempSrc: Oral  Oral   SpO2: 97% 100% 96%   Weight:      Height:        General: Pt is alert, follows commands appropriately, not in acute distress Cardiovascular: Regular rate and rhythm, S1/S2 +,  no rubs, no gallops Respiratory: Clear to auscultation bilaterally, no wheezing, no crackles, no rhonchi Abdominal: Soft, non tender, non distended, bowel sounds +, no guarding Extremities: no edema, no cyanosis, pulses palpable bilaterally DP and PT Neuro: Grossly nonfocal  Discharge Instructions   Allergies as of 05/17/2016   No Known Allergies     Medication List    TAKE these medications   aspirin 325 MG  tablet Take 1 tablet (325 mg total) by mouth daily.   atorvastatin 40 MG tablet Commonly known as:  LIPITOR Take 1 tablet (40 mg total) by mouth daily at 6 PM.   levofloxacin 500 MG tablet Commonly known as:  LEVAQUIN Take 1 tablet (500 mg total) by mouth daily.       Follow-up [place], MD Follow up.   Specialty:  Internal Medicine Contact information: [address] [place] [address] [phone]            The results of significant diagnostics from this hospitalization (including imaging, microbiology, ancillary and laboratory) are listed below for reference.     Microbiology: No results found for this or any previous visit (from the past 240 hour(s)).   Labs: Basic Metabolic Panel:  Recent Labs Lab 05/16/16 1812 05/16/16 1827  NA 137 139  K 3.9 3.9  CL 101 102  CO2 26  --   GLUCOSE 106* 100*  BUN 15 18  CREATININE 0.84 0.80  CALCIUM 9.6  --    Liver Function Tests:  Recent Labs Lab 05/16/16 1812  AST 49*  ALT 16  ALKPHOS 74  BILITOT 0.5  PROT 8.3*  ALBUMIN 4.2   CBC:  Recent Labs Lab 05/16/16 1812 05/16/16 1827  WBC 6.5  --   NEUTROABS 4.5  --   HGB 12.5 13.3  HCT 38.4 39.0  MCV 95.5  --   PLT 225  --    CBG:  Recent Labs Lab 05/16/16 1935  GLUCAP 93   SIGNED: Time coordinating discharge: 30 minutes  Faye Ramsay, MD  [facility] 05/17/2016, 10:43 AM Pager [phone]  If 7PM-7AM, please contact night-coverage [email or web address] Password [email or web address]

## 2016-05-17 NOTE — Progress Notes (Signed)
Physical Therapy Evaluation Patient Details Name: Kimberly Ramirez MRN: [id number] DOB: [birth year] Today's Date: 05/17/2016   History of Present Illness  On 05/16/2016 the patient was talking on the phone when she had difficulty speaking. It only lasted 5 minutes then cleared. MRI and MRA were both negative. PMH: Mo significant PMH  Clinical Impression  Patients balance appears to be reduced compared to baseline. She had a loss of balance standing but once out of bed and moving the patients balance improved significantly. The patients daughter feels and therapy agrees that when she eats and gets up and gets moving she will likely continue to improve. She may benefit from home health PT to work on balance and strength but if her mobility improves she may not need it. Acute therapy will continue to follow her if she is in the hospital.     Follow Up Recommendations Home health PT    Equipment Recommendations  None recommended by PT    Recommendations for Other Services       Precautions / Restrictions Precautions Precautions: None Restrictions Weight Bearing Restrictions: No      Mobility  Bed Mobility Overal bed mobility: Independent             General bed mobility comments: No help needed to the edge of the bed   Transfers Overall transfer level: Needs assistance   Transfers: Sit to/from Stand Sit to Stand: Min guard         General transfer comment: upon initial standing from the bed the patient lost her balance and had to sit back down. On her second trial she had no difficulty. Once standing no loss of balance   Ambulation/Gait Ambulation/Gait assistance: Supervision Ambulation Distance (Feet): 150 Feet Assistive device: None Gait Pattern/deviations: Drifts right/left   Gait velocity interpretation: Below normal speed for age/gender General Gait Details: llateral movements > to the left. As she walked more her gait improved.   Stairs            Wheelchair  Mobility    Modified Rankin (Stroke Patients Only)       Balance Overall balance assessment: Needs assistance Sitting-balance support: No upper extremity supported Sitting balance-Leahy Scale: Good     Standing balance support: No upper extremity supported Standing balance-Leahy Scale: Fair Standing balance comment: Guarding required for balance but balance improved with ambulation                              Pertinent Vitals/Pain Pain Assessment: No/denies pain    Home Living Family/patient expects to be discharged to:: Private residence Living Arrangements: Alone Available Help at Discharge: Family Type of Home: House           Additional Comments: Nothing that will effect her mobility upon discharge     Prior Function Level of Independence: Independent         Comments: did not require a device prior to discharge      Hand Dominance   Dominant Hand: Right    Extremity/Trunk Assessment   Upper Extremity Assessment Upper Extremity Assessment: Defer to OT evaluation    Lower Extremity Assessment Lower Extremity Assessment: Overall WFL for tasks assessed       Communication   Communication: No difficulties  Cognition Arousal/Alertness: Awake/alert Behavior During Therapy: WFL for tasks assessed/performed Overall Cognitive Status: Within Functional Limits for tasks assessed  General Comments: AOx3 could recall day but not date     General Comments General comments (skin integrity, edema, etc.): finger to nose test (-) patient able to track apporprietly; proper covergence, no coordination deficits noted     Exercises     Assessment/Plan    PT Assessment Patient needs continued PT services  PT Problem List Decreased strength;Decreased activity tolerance;Decreased balance;Decreased safety awareness          PT Treatment Interventions Gait training;Therapeutic activities;Therapeutic exercise;Balance  training;Neuromuscular re-education;Patient/family education    PT Goals (Current goals can be found in the Care Plan section)  Acute Rehab PT Goals Patient Stated Goal: to go home  PT Goal Formulation: With patient/family Time For Goal Achievement: 05/31/16 Potential to Achieve Goals: Good    Frequency Min 3X/week   Barriers to discharge   None     Co-evaluation               End of Session Equipment Utilized During Treatment: Gait belt Activity Tolerance: Patient tolerated treatment well Patient left: in chair;with family/visitor present Nurse Communication: Mobility status    Functional Assessment Tool Used: clinical decision making  Functional Limitation: Mobility: Walking and moving around Mobility: Walking and Moving Around Current Status [phone]): At least 1 percent but less than 20 percent impaired, limited or restricted Mobility: Walking and Moving Around Goal Status [phone]): 0 percent impaired, limited or restricted    Time: 0920-0944 PT Time Calculation (min) (ACUTE ONLY): 24 min   Charges:   PT Evaluation $PT Eval Low Complexity: 1 Procedure     PT G Codes:   PT G-Codes **NOT FOR INPATIENT CLASS** Functional Assessment Tool Used: clinical decision making  Functional Limitation: Mobility: Walking and moving around Mobility: Walking and Moving Around Current Status [id number]): At least 1 percent but less than 20 percent impaired, limited or restricted Mobility: Walking and Moving Around Goal Status [phone]): 0 percent impaired, limited or restricted    Carney Living  PT DPT  05/17/2016, 10:36 AM

## 2016-05-17 NOTE — Progress Notes (Signed)
VASCULAR LAB PRELIMINARY  PRELIMINARY  PRELIMINARY  PRELIMINARY  Carotid duplex completed.    Preliminary report:  1-39% ICA stenosis.  Vertebral artery flow is antegrade.   Angeleen Horney, RVT 05/17/2016, 1:18 PM

## 2016-05-17 NOTE — Progress Notes (Signed)
Pt. And family got d/c papers,follow up appointment.IV was d/c.Pt. Ready to go home.

## 2016-05-18 LAB — VAS US CAROTID
LCCADSYS: -86 cm/s
LCCAPSYS: 125 cm/s
LEFT ECA DIAS: -4 cm/s
LEFT VERTEBRAL DIAS: 8 cm/s
LICADSYS: -132 cm/s
Left CCA dist dias: -17 cm/s
Left CCA prox dias: 11 cm/s
Left ICA dist dias: -26 cm/s
Left ICA prox dias: -16 cm/s
Left ICA prox sys: -76 cm/s
RCCADSYS: -123 cm/s
RCCAPDIAS: -11 cm/s
RCCAPSYS: -88 cm/s
RIGHT ECA DIAS: -4 cm/s
RIGHT VERTEBRAL DIAS: -14 cm/s

## 2016-05-18 LAB — HEMOGLOBIN A1C
HEMOGLOBIN A1C: 5.4 % (ref 4.8–5.6)
Mean Plasma Glucose: 108 mg/dL

## 2016-05-19 LAB — URINE CULTURE: Culture: >=100000 — AB

## 2016-05-19 LAB — POCT I-STAT TROPONIN I: TROPONIN I, POC: 0 ng/mL (ref 0.00–0.08)

## 2016-05-19 NOTE — Progress Notes (Signed)
[name], female   DOB: 12-24-1927, 83 y.o.   MRN: [id number] Called Kimberly Ramirez daughter to notify that urine culture positive for E. Coli and resistant to Levaquin, I have called in prescription to [facility] on [address] for Macrobid BID for 5 days.  Faye Ramsay, MD  [facility] Pager [phone]  If 7PM-7AM, please contact night-coverage [email or web address] Password [email or web address]

## 2016-05-23 DIAGNOSIS — E7800 Pure hypercholesterolemia, unspecified: Secondary | ICD-10-CM | POA: Diagnosis not present

## 2016-05-23 DIAGNOSIS — G459 Transient cerebral ischemic attack, unspecified: Secondary | ICD-10-CM | POA: Diagnosis not present

## 2016-05-23 DIAGNOSIS — I10 Essential (primary) hypertension: Secondary | ICD-10-CM | POA: Diagnosis not present

## 2016-06-05 ENCOUNTER — Encounter: Payer: Self-pay | Admitting: Podiatry

## 2016-06-05 ENCOUNTER — Ambulatory Visit (INDEPENDENT_AMBULATORY_CARE_PROVIDER_SITE_OTHER): Payer: Medicare Other | Admitting: Podiatry

## 2016-06-05 VITALS — Ht 65.0 in | Wt 123.0 lb

## 2016-06-05 DIAGNOSIS — L84 Corns and callosities: Secondary | ICD-10-CM

## 2016-06-05 DIAGNOSIS — B351 Tinea unguium: Secondary | ICD-10-CM | POA: Diagnosis not present

## 2016-06-05 DIAGNOSIS — M79676 Pain in unspecified toe(s): Secondary | ICD-10-CM | POA: Diagnosis not present

## 2016-06-05 NOTE — Progress Notes (Signed)
[name], female   DOB: 01/09/1928, 83 y.o.   MRN: [id number] Complaint:  Visit Type: Patient returns to my office for continued preventative foot care services. Complaint: Patient states" my nails have grown long and thick and become painful to walk and wear shoes" . She presents for preventative foot care services. No changes to ROS  Podiatric Exam: Vascular: dorsalis pedis and posterior tibial pulses are palpable bilateral. Capillary return is immediate. Temperature gradient is WNL. Skin turgor WNL  Sensorium: Normal Semmes Weinstein monofilament test. Normal tactile sensation bilaterally. Nail Exam: Pt has thick disfigured discolored nails with subungual debris noted bilateral entire nail hallux through fifth toenails Ulcer Exam: There is no evidence of ulcer or pre-ulcerative changes or infection. Orthopedic Exam: Muscle tone and strength are WNL. No limitations in general ROM. No crepitus or effusions noted. HAV 1st MPJ  Right foot.. Skin: No Porokeratosis. No infection or ulcers.  [place] durum 5th toe B/L  Diagnosis:  Tinea unguium, Pain in right toe, pain in left toe,  painful corn fifth toe left foot  Treatment & Plan Procedures and Treatment: Consent by patient was obtained for treatment procedures. The patient understood the discussion of treatment and procedures well. All questions were answered thoroughly reviewed. Debridement of mycotic and hypertrophic toenails, 1 through 5 bilateral and clearing of subungual debris. No ulceration, no infection noted. Debridement fifth toe left foot. Return Visit-Office Procedure: Patient instructed to return to the office for a follow up visit 3 months for continued evaluation and treatment.   Daneisha Surges DPM 

## 2016-06-09 NOTE — Progress Notes (Signed)
PT evaluation Addendum Late entry for 05/17/16 Visit diagnosis   05/17/16 1000  PT Assessment  PT Recommendation/Assessment Patient needs continued PT services  PT Visit Diagnosis Unsteadiness on feet (R26.81)  PT Problem List Decreased strength;Decreased activity tolerance;Decreased balance;Decreased safety awareness  Barriers to Discharge Comments None   06/09/2016 [name], [place]

## 2016-06-13 DIAGNOSIS — H43813 Vitreous degeneration, bilateral: Secondary | ICD-10-CM | POA: Diagnosis not present

## 2016-06-13 DIAGNOSIS — Z961 Presence of intraocular lens: Secondary | ICD-10-CM | POA: Diagnosis not present

## 2016-06-13 DIAGNOSIS — H52203 Unspecified astigmatism, bilateral: Secondary | ICD-10-CM | POA: Diagnosis not present

## 2016-06-13 DIAGNOSIS — H353132 Nonexudative age-related macular degeneration, bilateral, intermediate dry stage: Secondary | ICD-10-CM | POA: Diagnosis not present

## 2016-07-07 DIAGNOSIS — Z6820 Body mass index (BMI) 20.0-20.9, adult: Secondary | ICD-10-CM | POA: Diagnosis not present

## 2016-07-07 DIAGNOSIS — Z01419 Encounter for gynecological examination (general) (routine) without abnormal findings: Secondary | ICD-10-CM | POA: Diagnosis not present

## 2016-07-07 DIAGNOSIS — B373 Candidiasis of vulva and vagina: Secondary | ICD-10-CM | POA: Diagnosis not present

## 2016-08-21 ENCOUNTER — Ambulatory Visit: Payer: Medicare Other | Admitting: Podiatry

## 2016-08-25 DIAGNOSIS — N390 Urinary tract infection, site not specified: Secondary | ICD-10-CM | POA: Diagnosis not present

## 2016-08-25 DIAGNOSIS — B373 Candidiasis of vulva and vagina: Secondary | ICD-10-CM | POA: Diagnosis not present

## 2016-09-04 ENCOUNTER — Ambulatory Visit (INDEPENDENT_AMBULATORY_CARE_PROVIDER_SITE_OTHER): Payer: Medicare Other | Admitting: Podiatry

## 2016-09-04 ENCOUNTER — Encounter: Payer: Self-pay | Admitting: Podiatry

## 2016-09-04 DIAGNOSIS — L84 Corns and callosities: Secondary | ICD-10-CM

## 2016-09-04 DIAGNOSIS — B351 Tinea unguium: Secondary | ICD-10-CM | POA: Diagnosis not present

## 2016-09-04 DIAGNOSIS — M79676 Pain in unspecified toe(s): Secondary | ICD-10-CM

## 2016-09-04 NOTE — Progress Notes (Signed)
[name], female   DOB: 09/21/1927, 83 y.o.   MRN: [id number] Complaint:  Visit Type: Patient returns to my office for continued preventative foot care services. Complaint: Patient states" my nails have grown long and thick and become painful to walk and wear shoes" . She presents for preventative foot care services. No changes to ROS  Podiatric Exam: Vascular: dorsalis pedis and posterior tibial pulses are palpable bilateral. Capillary return is immediate. Temperature gradient is WNL. Skin turgor WNL  Sensorium: Normal Semmes Weinstein monofilament test. Normal tactile sensation bilaterally. Nail Exam: Pt has thick disfigured discolored nails with subungual debris noted bilateral entire nail hallux through fifth toenails Ulcer Exam: There is no evidence of ulcer or pre-ulcerative changes or infection. Orthopedic Exam: Muscle tone and strength are WNL. No limitations in general ROM. No crepitus or effusions noted. HAV 1st MPJ  Right foot.. Skin: No Porokeratosis. No infection or ulcers.  Heloma durum 5th toe B/L  Diagnosis:  Tinea unguium, Pain in right toe, pain in left toe,  painful corn fifth toe left foot  Treatment & Plan Procedures and Treatment: Consent by patient was obtained for treatment procedures. The patient understood the discussion of treatment and procedures well. All questions were answered thoroughly reviewed. Debridement of mycotic and hypertrophic toenails, 1 through 5 bilateral and clearing of subungual debris. No ulceration, no infection noted. Debridement fifth toe left foot. Return Visit-Office Procedure: Patient instructed to return to the office for a follow up visit 3 months for continued evaluation and treatment.   Daishon Chui DPM 

## 2016-12-10 ENCOUNTER — Ambulatory Visit (INDEPENDENT_AMBULATORY_CARE_PROVIDER_SITE_OTHER): Payer: Medicare Other | Admitting: Podiatry

## 2016-12-10 ENCOUNTER — Encounter: Payer: Self-pay | Admitting: Podiatry

## 2016-12-10 DIAGNOSIS — M79676 Pain in unspecified toe(s): Secondary | ICD-10-CM

## 2016-12-10 DIAGNOSIS — B351 Tinea unguium: Secondary | ICD-10-CM | POA: Diagnosis not present

## 2016-12-10 DIAGNOSIS — L84 Corns and callosities: Secondary | ICD-10-CM | POA: Diagnosis not present

## 2016-12-10 NOTE — Progress Notes (Signed)
[name], female   DOB: 1927/12/17, 83 y.o.   MRN: [id number] Complaint:  Visit Type: Patient returns to my office for continued preventative foot care services. Complaint: Patient states" my nails have grown long and thick and become painful to walk and wear shoes" . She presents for preventative foot care services. No changes to ROS  Podiatric Exam: Vascular: dorsalis pedis and posterior tibial pulses are palpable bilateral. Capillary return is immediate. Temperature gradient is WNL. Skin turgor WNL  Sensorium: Normal Semmes Weinstein monofilament test. Normal tactile sensation bilaterally. Nail Exam: Pt has thick disfigured discolored nails with subungual debris noted bilateral entire nail hallux through fifth toenails Ulcer Exam: There is no evidence of ulcer or pre-ulcerative changes or infection. Orthopedic Exam: Muscle tone and strength are WNL. No limitations in general ROM. No crepitus or effusions noted. HAV 1st MPJ  Right foot.. Skin: No Porokeratosis. No infection or ulcers.  Heloma durum 5th toe B/L  Diagnosis:  Tinea unguium, Pain in right toe, pain in left toe,  painful corn fifth toe left foot  Treatment & Plan Procedures and Treatment: Consent by patient was obtained for treatment procedures. The patient understood the discussion of treatment and procedures well. All questions were answered thoroughly reviewed. Debridement of mycotic and hypertrophic toenails, 1 through 5 bilateral and clearing of subungual debris. No ulceration, no infection noted. Debridement fifth toe left foot. Return Visit-Office Procedure: Patient instructed to return to the office for a follow up visit 3 months for continued evaluation and treatment.   Gardiner Barefoot DPM

## 2017-01-28 DIAGNOSIS — Z23 Encounter for immunization: Secondary | ICD-10-CM | POA: Diagnosis not present

## 2017-03-17 ENCOUNTER — Ambulatory Visit: Payer: Medicare Other | Admitting: Podiatry

## 2017-04-09 DIAGNOSIS — H53123 Transient visual loss, bilateral: Secondary | ICD-10-CM | POA: Diagnosis not present

## 2017-04-09 DIAGNOSIS — Z961 Presence of intraocular lens: Secondary | ICD-10-CM | POA: Diagnosis not present

## 2017-04-09 DIAGNOSIS — H353133 Nonexudative age-related macular degeneration, bilateral, advanced atrophic without subfoveal involvement: Secondary | ICD-10-CM | POA: Diagnosis not present

## 2017-04-09 DIAGNOSIS — H26493 Other secondary cataract, bilateral: Secondary | ICD-10-CM | POA: Diagnosis not present

## 2017-04-14 DIAGNOSIS — G459 Transient cerebral ischemic attack, unspecified: Secondary | ICD-10-CM

## 2017-04-14 HISTORY — DX: Transient cerebral ischemic attack, unspecified: G45.9

## 2017-05-13 ENCOUNTER — Ambulatory Visit (INDEPENDENT_AMBULATORY_CARE_PROVIDER_SITE_OTHER): Payer: Medicare Other | Admitting: Podiatry

## 2017-05-13 DIAGNOSIS — L989 Disorder of the skin and subcutaneous tissue, unspecified: Secondary | ICD-10-CM | POA: Diagnosis not present

## 2017-05-13 DIAGNOSIS — B351 Tinea unguium: Secondary | ICD-10-CM

## 2017-05-13 DIAGNOSIS — M79676 Pain in unspecified toe(s): Secondary | ICD-10-CM

## 2017-05-15 DIAGNOSIS — S72001A Fracture of unspecified part of neck of right femur, initial encounter for closed fracture: Secondary | ICD-10-CM

## 2017-05-15 HISTORY — DX: Fracture of unspecified part of neck of right femur, initial encounter for closed fracture: S72.001A

## 2017-05-17 NOTE — Progress Notes (Signed)
    Subjective: Patient is a 84 y.o. female presenting to the office today with a chief complaint of painful callus lesions to the bilateral feet that have been present for several weeks. She has not done anything to treat the areas. Walking increases the pain while resting the feet and not bearing weight helps alleviate it.   Patient also complains of elongated, thickened nails that cause pain while ambulating in shoes. Patient is unable to trim their own nails. Patient presents today for further treatment and evaluation.  No past medical history on file.  Objective:  Physical Exam General: Alert and oriented x3 in no acute distress  Dermatology: Hyperkeratotic lesions present on the bilateral feet. Pain on palpation with a central nucleated core noted. Skin is warm, dry and supple bilateral lower extremities. Negative for open lesions or macerations. Nails are tender, long, thickened and dystrophic with subungual debris, consistent with onychomycosis, 1-5 bilateral. No signs of infection noted.  Vascular: Palpable pedal pulses bilaterally. No edema or erythema noted. Capillary refill within normal limits.  Neurological: Epicritic and protective threshold grossly intact bilaterally.   Musculoskeletal Exam: Pain on palpation at the keratotic lesion noted. Range of motion within normal limits bilateral. Muscle strength 5/5 in all groups bilateral.  Assessment: 1. Onychodystrophic nails 1-5 bilateral with hyperkeratosis of nails.  2. Onychomycosis of nail due to dermatophyte bilateral 3. Pre-ulcerative calluses to the bilateral feet x 3   Plan of Care:  #1 Patient evaluated. #2 Excisional debridement of keratoic lesion using a chisel blade was performed without incident.  #3 Dressed with light dressing. #4 Mechanical debridement of nails 1-5 bilaterally performed using a nail nipper. Filed with dremel without incident.  #5 Patient is to return to the clinic in 3 months.   Edrick Kins, DPM [facility]  Dr. Edrick Kins, [address]                                        [place],  [address]                Office [phone]  Fax [phone]

## 2017-05-30 ENCOUNTER — Emergency Department (HOSPITAL_COMMUNITY): Payer: Medicare Other

## 2017-05-30 ENCOUNTER — Other Ambulatory Visit: Payer: Self-pay

## 2017-05-30 ENCOUNTER — Inpatient Hospital Stay (HOSPITAL_COMMUNITY)
Admission: EM | Admit: 2017-05-30 | Discharge: 2017-06-03 | DRG: 482 | Disposition: A | Discharge status: Skilled Nursing Facility | Payer: Medicare Other | Attending: Internal Medicine | Admitting: Internal Medicine

## 2017-05-30 ENCOUNTER — Encounter (HOSPITAL_COMMUNITY): Payer: Self-pay | Admitting: *Deleted

## 2017-05-30 DIAGNOSIS — I10 Essential (primary) hypertension: Secondary | ICD-10-CM | POA: Diagnosis not present

## 2017-05-30 DIAGNOSIS — Z9181 History of falling: Secondary | ICD-10-CM | POA: Diagnosis not present

## 2017-05-30 DIAGNOSIS — T148XXA Other injury of unspecified body region, initial encounter: Secondary | ICD-10-CM | POA: Diagnosis not present

## 2017-05-30 DIAGNOSIS — Z8673 Personal history of transient ischemic attack (TIA), and cerebral infarction without residual deficits: Secondary | ICD-10-CM

## 2017-05-30 DIAGNOSIS — Z820 Family history of epilepsy and other diseases of the nervous system: Secondary | ICD-10-CM | POA: Diagnosis not present

## 2017-05-30 DIAGNOSIS — Z7982 Long term (current) use of aspirin: Secondary | ICD-10-CM

## 2017-05-30 DIAGNOSIS — R41841 Cognitive communication deficit: Secondary | ICD-10-CM | POA: Diagnosis not present

## 2017-05-30 DIAGNOSIS — S72091A Other fracture of head and neck of right femur, initial encounter for closed fracture: Secondary | ICD-10-CM | POA: Diagnosis not present

## 2017-05-30 DIAGNOSIS — L292 Pruritus vulvae: Secondary | ICD-10-CM | POA: Diagnosis not present

## 2017-05-30 DIAGNOSIS — K5900 Constipation, unspecified: Secondary | ICD-10-CM | POA: Diagnosis present

## 2017-05-30 DIAGNOSIS — R262 Difficulty in walking, not elsewhere classified: Secondary | ICD-10-CM | POA: Diagnosis not present

## 2017-05-30 DIAGNOSIS — S72141A Displaced intertrochanteric fracture of right femur, initial encounter for closed fracture: Principal | ICD-10-CM

## 2017-05-30 DIAGNOSIS — M6281 Muscle weakness (generalized): Secondary | ICD-10-CM | POA: Diagnosis not present

## 2017-05-30 DIAGNOSIS — W19XXXA Unspecified fall, initial encounter: Secondary | ICD-10-CM

## 2017-05-30 DIAGNOSIS — W010XXA Fall on same level from slipping, tripping and stumbling without subsequent striking against object, initial encounter: Secondary | ICD-10-CM | POA: Diagnosis present

## 2017-05-30 DIAGNOSIS — S72009A Fracture of unspecified part of neck of unspecified femur, initial encounter for closed fracture: Secondary | ICD-10-CM | POA: Diagnosis present

## 2017-05-30 DIAGNOSIS — Z4789 Encounter for other orthopedic aftercare: Secondary | ICD-10-CM | POA: Diagnosis not present

## 2017-05-30 DIAGNOSIS — D72829 Elevated white blood cell count, unspecified: Secondary | ICD-10-CM | POA: Diagnosis present

## 2017-05-30 DIAGNOSIS — Z79899 Other long term (current) drug therapy: Secondary | ICD-10-CM | POA: Diagnosis not present

## 2017-05-30 DIAGNOSIS — R279 Unspecified lack of coordination: Secondary | ICD-10-CM | POA: Diagnosis not present

## 2017-05-30 DIAGNOSIS — E785 Hyperlipidemia, unspecified: Secondary | ICD-10-CM | POA: Diagnosis not present

## 2017-05-30 DIAGNOSIS — M25551 Pain in right hip: Secondary | ICD-10-CM | POA: Diagnosis not present

## 2017-05-30 DIAGNOSIS — Z90710 Acquired absence of both cervix and uterus: Secondary | ICD-10-CM

## 2017-05-30 DIAGNOSIS — S72001A Fracture of unspecified part of neck of right femur, initial encounter for closed fracture: Secondary | ICD-10-CM | POA: Diagnosis not present

## 2017-05-30 DIAGNOSIS — S72141D Displaced intertrochanteric fracture of right femur, subsequent encounter for closed fracture with routine healing: Secondary | ICD-10-CM | POA: Diagnosis not present

## 2017-05-30 DIAGNOSIS — R300 Dysuria: Secondary | ICD-10-CM | POA: Diagnosis present

## 2017-05-30 DIAGNOSIS — Z09 Encounter for follow-up examination after completed treatment for conditions other than malignant neoplasm: Secondary | ICD-10-CM

## 2017-05-30 DIAGNOSIS — G8911 Acute pain due to trauma: Secondary | ICD-10-CM | POA: Diagnosis not present

## 2017-05-30 DIAGNOSIS — Z111 Encounter for screening for respiratory tuberculosis: Secondary | ICD-10-CM | POA: Diagnosis not present

## 2017-05-30 DIAGNOSIS — G459 Transient cerebral ischemic attack, unspecified: Secondary | ICD-10-CM | POA: Diagnosis not present

## 2017-05-30 DIAGNOSIS — S199XXA Unspecified injury of neck, initial encounter: Secondary | ICD-10-CM | POA: Diagnosis not present

## 2017-05-30 DIAGNOSIS — S92153A Displaced avulsion fracture (chip fracture) of unspecified talus, initial encounter for closed fracture: Secondary | ICD-10-CM | POA: Diagnosis not present

## 2017-05-30 DIAGNOSIS — S0003XA Contusion of scalp, initial encounter: Secondary | ICD-10-CM | POA: Diagnosis not present

## 2017-05-30 DIAGNOSIS — S72144A Nondisplaced intertrochanteric fracture of right femur, initial encounter for closed fracture: Secondary | ICD-10-CM | POA: Diagnosis not present

## 2017-05-30 DIAGNOSIS — S299XXA Unspecified injury of thorax, initial encounter: Secondary | ICD-10-CM | POA: Diagnosis not present

## 2017-05-30 HISTORY — DX: Fracture of unspecified part of neck of right femur, initial encounter for closed fracture: S72.001A

## 2017-05-30 HISTORY — DX: Transient cerebral ischemic attack, unspecified: G45.9

## 2017-05-30 LAB — COMPREHENSIVE METABOLIC PANEL
ALBUMIN: 3.6 g/dL (ref 3.5–5.0)
ALT: 12 U/L — ABNORMAL LOW (ref 14–54)
AST: 46 U/L — AB (ref 15–41)
Alkaline Phosphatase: 70 U/L (ref 38–126)
Anion gap: 13 (ref 5–15)
BUN: 15 mg/dL (ref 6–20)
CHLORIDE: 102 mmol/L (ref 101–111)
CO2: 21 mmol/L — ABNORMAL LOW (ref 22–32)
Calcium: 8.9 mg/dL (ref 8.9–10.3)
Creatinine, Ser: 0.81 mg/dL (ref 0.44–1.00)
GFR calc Af Amer: >60 mL/min (ref >60)
GLUCOSE: 136 mg/dL — AB (ref 65–99)
POTASSIUM: 4.2 mmol/L (ref 3.5–5.1)
Sodium: 136 mmol/L (ref 135–145)
Total Bilirubin: 0.6 mg/dL (ref 0.3–1.2)
Total Protein: 6.7 g/dL (ref 6.5–8.1)

## 2017-05-30 LAB — CBC WITH DIFFERENTIAL/PLATELET
BASOS ABS: 0 10*3/uL (ref 0.0–0.1)
BASOS PCT: 0 %
EOS ABS: 0 10*3/uL (ref 0.0–0.7)
Eosinophils Relative: 0 %
HCT: 34.7 % — ABNORMAL LOW (ref 36.0–46.0)
Hemoglobin: 11.1 g/dL — ABNORMAL LOW (ref 12.0–15.0)
Lymphocytes Relative: 7 %
Lymphs Abs: 0.9 10*3/uL (ref 0.7–4.0)
MCH: 30.8 pg (ref 26.0–34.0)
MCHC: 32 g/dL (ref 30.0–36.0)
MCV: 96.4 fL (ref 78.0–100.0)
MONO ABS: 0.7 10*3/uL (ref 0.1–1.0)
Monocytes Relative: 5 %
Neutro Abs: 11.3 10*3/uL — ABNORMAL HIGH (ref 1.7–7.7)
Neutrophils Relative %: 88 %
PLATELETS: 190 10*3/uL (ref 150–400)
RBC: 3.6 MIL/uL — ABNORMAL LOW (ref 3.87–5.11)
RDW: 12.1 % (ref 11.5–15.5)
WBC: 12.9 10*3/uL — ABNORMAL HIGH (ref 4.0–10.5)

## 2017-05-30 LAB — PROTIME-INR
INR: 1.01
PROTHROMBIN TIME: 13.2 s (ref 11.4–15.2)

## 2017-05-30 MED ORDER — HYDROCODONE-ACETAMINOPHEN 5-325 MG PO TABS
1.0000 | ORAL_TABLET | Freq: Four times a day (QID) | ORAL | Status: DC | PRN
Start: 2017-05-30 — End: 2017-06-03
  Administered 2017-05-31 – 2017-06-01 (×2): 2 via ORAL
  Administered 2017-06-01: 1 via ORAL
  Administered 2017-06-01 – 2017-06-02 (×2): 2 via ORAL
  Filled 2017-05-30 (×3): qty 2
  Filled 2017-05-30: qty 1
  Filled 2017-05-30: qty 2

## 2017-05-30 MED ORDER — WHITE PETROLATUM EX OINT
TOPICAL_OINTMENT | CUTANEOUS | Status: AC
  Administered 2017-05-30
  Filled 2017-05-30: qty 28.35

## 2017-05-30 MED ORDER — MORPHINE SULFATE (PF) 2 MG/ML IV SOLN
0.5000 mg | INTRAVENOUS | Status: DC | PRN
  Administered 2017-05-30 – 2017-05-31 (×3): 0.5 mg via INTRAVENOUS
  Filled 2017-05-30 (×2): qty 1

## 2017-05-30 MED ORDER — METHOCARBAMOL 500 MG PO TABS
500.0000 mg | ORAL_TABLET | Freq: Four times a day (QID) | ORAL | Status: DC | PRN
  Administered 2017-05-30 – 2017-06-02 (×4): 500 mg via ORAL
  Filled 2017-05-30 (×3): qty 1

## 2017-05-30 MED ORDER — METHOCARBAMOL 1000 MG/10ML IJ SOLN
500.0000 mg | Freq: Four times a day (QID) | INTRAVENOUS | Status: DC | PRN
  Filled 2017-05-30: qty 5

## 2017-05-30 MED ORDER — MORPHINE SULFATE (PF) 4 MG/ML IV SOLN
2.0000 mg | Freq: Once | INTRAVENOUS | Status: AC
  Administered 2017-05-30: 2 mg via INTRAVENOUS
  Filled 2017-05-30: qty 1

## 2017-05-30 MED ORDER — SODIUM CHLORIDE 0.9 % IV SOLN
INTRAVENOUS | Status: AC
  Administered 2017-05-30: 23:00:00 via INTRAVENOUS

## 2017-05-30 NOTE — ED Notes (Signed)
Ortho at bedside.

## 2017-05-30 NOTE — ED Provider Notes (Signed)
[place] EMERGENCY DEPARTMENT Provider Note   CSN: [id number] Arrival date & time: 05/30/17  1635     History   Chief Complaint Chief Complaint  Patient presents with  . Fall  . Hip Pain    HPI Kimberly Ramirez is a 84 y.o. female.  HPI  84 year old female history of hyperlipidemia, TIA takes daily aspirin presents the emergency department status post unwitnessed fall was mechanical in nature.  Patient recalls all the events that happened prior to arrival.  Patient states slipping on a carpet landing on a hard floor on her right side.  Patient said striking the right side of the face along with right hip right elbow.  Patient arrives hemodynamically stable/well-appearing.  Prior to arrival she was given 100 mcg of fentanyl and is currently pain-free.  Patient denies any paresthesia, paralysis, muscular weakness or back pain.  Baseline functional status is ambulation without assistance and she lives alone.  History reviewed. No pertinent past medical history.  Patient Active Problem List   Diagnosis Date Noted  . TIA (transient ischemic attack) 05/16/2016    Past Surgical History:  Procedure Laterality Date  . ABDOMINAL HYSTERECTOMY      OB History    No data available       Home Medications    Prior to Admission medications   Medication Sig Start Date End Date Taking? Authorizing Provider  aspirin 325 MG tablet Take 1 tablet (325 mg total) by mouth daily. 05/17/16   Theodis Blaze, MD  atorvastatin (LIPITOR) 40 MG tablet Take 1 tablet (40 mg total) by mouth daily at 6 PM. 05/17/16   Theodis Blaze, MD  levofloxacin (LEVAQUIN) 500 MG tablet Take 1 tablet (500 mg total) by mouth daily. 05/17/16   Theodis Blaze, MD    Family History Family History  Problem Relation Age of Onset  . Parkinson's disease Mother   . Heart Problems Father     Social History Social History   Tobacco Use  . Smoking status: Never Smoker  . Smokeless tobacco: Never Used    Substance Use Topics  . Alcohol use: No  . Drug use: No     Allergies   Patient has no known allergies.   Review of Systems Review of Systems  Review of Systems  Constitutional: Negative for fever and chills.  HENT: Negative for ear pain, sore throat and trouble swallowing.   Eyes: Negative for pain and visual disturbance.  Respiratory: Negative for cough and shortness of breath.   Cardiovascular: Negative for chest pain and leg swelling.  Gastrointestinal: Negative for nausea, vomiting, abdominal pain and diarrhea.  Genitourinary: Negative for dysuria, urgency and frequency.  Musculoskeletal: see HPI Skin: Negative for rash and wound.  Neurological: Negative for dizziness, syncope, speech difficulty, weakness and numbness.   Physical Exam Updated Vital Signs BP (!) 147/76   Pulse 86   Temp 97.6 F (36.4 C) (Oral)   Resp 16   Ht 5' 5.5" (1.664 m)   Wt 53.5 kg (118 lb)   SpO2 100%   BMI 19.34 kg/m   Physical Exam  Physical Exam Vitals:   05/30/17 1845 05/30/17 1900  BP: 140/60 (!) 147/76  Pulse: 84 86  Resp: 18 16  Temp:    SpO2: 100% 100%   Constitutional: Patient is in no acute distress Head: Normocephalic and atraumatic.  Eyes: Extraocular motion intact, no scleral icterus Neck: Supple without meningismus, mass, or overt JVD Respiratory: Effort normal and breath sounds  normal. No respiratory distress. CV: Heart regular rate and rhythm, no obvious murmurs.  Pulses +2 and symmetric Abdomen: Soft, non-tender, non-distended MSK: neg C/T/L spine midline TTP; R hip internal rotation/shortening +TTP NVI;  Skin: Warm, dry, intact Neuro: Alert and oriented, no motor deficit noted Psychiatric: Mood and affect are normal.  ED Treatments / Results  Labs (all labs ordered are listed, but only abnormal results are displayed) Labs Reviewed  CBC WITH DIFFERENTIAL/PLATELET  COMPREHENSIVE METABOLIC PANEL  URINALYSIS, ROUTINE W REFLEX MICROSCOPIC  PROTIME-INR     EKG  EKG Interpretation None       Radiology Dg Chest 1 View  Result Date: 05/30/2017 CLINICAL DATA:  Fall, hip pain EXAM: CHEST 1 VIEW COMPARISON:  None. FINDINGS: Heart size and mediastinal contours are within normal limits. Coarse lung markings bilaterally suggests underlying chronic interstitial lung disease. No confluent opacity to suggest pneumonia. No pleural effusion or pneumothorax seen. No acute or suspicious osseous finding. IMPRESSION: No active disease. No evidence of pneumonia or pulmonary edema. Suspected chronic interstitial lung disease. Electronically Signed   By: Franki Cabot M.D.   On: 05/30/2017 18:02   Ct Head Wo Contrast  Result Date: 05/30/2017 CLINICAL DATA:  84 y/o  F; fall with head injury. EXAM: CT HEAD WITHOUT CONTRAST CT CERVICAL SPINE WITHOUT CONTRAST TECHNIQUE: Multidetector CT imaging of the head and cervical spine was performed following the standard protocol without intravenous contrast. Multiplanar CT image reconstructions of the cervical spine were also generated. COMPARISON:  05/16/2016 CT head 05/17/2016 MRI head. FINDINGS: CT HEAD FINDINGS Brain: No evidence of acute infarction, hemorrhage, hydrocephalus, extra-axial collection or mass lesion/mass effect. Stable moderate chronic microvascular ischemic changes and parenchymal volume loss of the brain given differences in technique. Small chronic lacunar infarction within the left thalamus. Vascular: Calcific atherosclerosis of carotid siphons. No hyperdense vessel identified. Skull: Right parietal region small scalp contusion. No calvarial fracture. Sinuses/Orbits: Left maxillary sinus extensive mucosal thickening in chronic inflammatory changes of the sinus walls. Bilateral intra-ocular lens replacement. Otherwise negative. Other: None. CT CERVICAL SPINE FINDINGS Alignment: Normal cervical lordosis without listhesis. Leftward curvature at cervicothoracic junction. Skull base and vertebrae: No acute  fracture. No primary bone lesion or focal pathologic process. Soft tissues and spinal canal: No prevertebral fluid or swelling. No visible canal hematoma. Disc levels: Multilevel disc and facet degenerative changes greatest at the C4-5 and C5-6 levels. Uncovertebral and facet hypertrophy results in moderate bilateral bony foraminal stenosis at the C5-6 level and mild left-sided stenosis at the C4-5 level. 5 mm central C4-5 disc protrusion mild-to-moderate canal stenosis and anterior cord impingement. Upper chest: Biapical pleuroparenchymal scarring. Other: Negative. IMPRESSION: CT head: 1. No acute intracranial abnormality or calvarial fracture identified. 2. Right parietal region small scalp contusion. 3. Stable moderate chronic microvascular ischemic changes and parenchymal volume loss of the brain given differences in technique. 4. Extensive left chronic maxillary sinus disease. CT cervical spine: 1. No acute fracture or dislocation identified. 2. Cervical spondylosis greatest at C4-5 and C5-6 levels. 3. C4-5 central disc protrusion with mild-to-moderate canal stenosis and anterior cord impingement. Electronically Signed   By: Kristine Garbe M.D.   On: 05/30/2017 18:50   Ct Cervical Spine Wo Contrast  Result Date: 05/30/2017 CLINICAL DATA:  84 y/o  F; fall with head injury. EXAM: CT HEAD WITHOUT CONTRAST CT CERVICAL SPINE WITHOUT CONTRAST TECHNIQUE: Multidetector CT imaging of the head and cervical spine was performed following the standard protocol without intravenous contrast. Multiplanar CT image reconstructions of the cervical  spine were also generated. COMPARISON:  05/16/2016 CT head 05/17/2016 MRI head. FINDINGS: CT HEAD FINDINGS Brain: No evidence of acute infarction, hemorrhage, hydrocephalus, extra-axial collection or mass lesion/mass effect. Stable moderate chronic microvascular ischemic changes and parenchymal volume loss of the brain given differences in technique. Small chronic lacunar  infarction within the left thalamus. Vascular: Calcific atherosclerosis of carotid siphons. No hyperdense vessel identified. Skull: Right parietal region small scalp contusion. No calvarial fracture. Sinuses/Orbits: Left maxillary sinus extensive mucosal thickening in chronic inflammatory changes of the sinus walls. Bilateral intra-ocular lens replacement. Otherwise negative. Other: None. CT CERVICAL SPINE FINDINGS Alignment: Normal cervical lordosis without listhesis. Leftward curvature at cervicothoracic junction. Skull base and vertebrae: No acute fracture. No primary bone lesion or focal pathologic process. Soft tissues and spinal canal: No prevertebral fluid or swelling. No visible canal hematoma. Disc levels: Multilevel disc and facet degenerative changes greatest at the C4-5 and C5-6 levels. Uncovertebral and facet hypertrophy results in moderate bilateral bony foraminal stenosis at the C5-6 level and mild left-sided stenosis at the C4-5 level. 5 mm central C4-5 disc protrusion mild-to-moderate canal stenosis and anterior cord impingement. Upper chest: Biapical pleuroparenchymal scarring. Other: Negative. IMPRESSION: CT head: 1. No acute intracranial abnormality or calvarial fracture identified. 2. Right parietal region small scalp contusion. 3. Stable moderate chronic microvascular ischemic changes and parenchymal volume loss of the brain given differences in technique. 4. Extensive left chronic maxillary sinus disease. CT cervical spine: 1. No acute fracture or dislocation identified. 2. Cervical spondylosis greatest at C4-5 and C5-6 levels. 3. C4-5 central disc protrusion with mild-to-moderate canal stenosis and anterior cord impingement. Electronically Signed   By: Kristine Garbe M.D.   On: 05/30/2017 18:50   Dg Hip Unilat W Or Wo Pelvis 1 View Right  Result Date: 05/30/2017 CLINICAL DATA:  Fall.  Right hip pain. EXAM: DG HIP (WITH OR WITHOUT PELVIS) 1V RIGHT COMPARISON:  None. FINDINGS:  Frontal pelvis shows diffuse demineralization. AP and frog-leg lateral views of the right hip reveal comminuted intertrochanteric right femoral neck fracture with varus angulation. SI joints and symphysis pubis unremarkable. IMPRESSION: Comminuted intertrochanteric right femoral neck fracture with varus angulation. Electronically Signed   By: Misty Stanley M.D.   On: 05/30/2017 17:57   Dg Femur 1 View Right  Result Date: 05/30/2017 CLINICAL DATA:  Fall, right hip pain EXAM: RIGHT FEMUR 1 VIEW COMPARISON:  None. FINDINGS: Displaced/comminuted fracture of the right femoral neck, intertrochanteric. Right femoral head remains well positioned relative to the acetabulum. Distal femur appears intact and normally aligned. IMPRESSION: Displaced/comminuted fracture of the right femoral neck, intertrochanteric, with nearly 90 degrees angulation at the fracture site. Electronically Signed   By: Franki Cabot M.D.   On: 05/30/2017 18:01    Procedures Procedures (including critical care time)  Medications Ordered in ED Medications - No data to display   Initial Impression / Assessment and Plan / ED Course  I have reviewed the triage vital signs and the nursing notes.  Pertinent labs & imaging results that were available during my care of the patient were reviewed by me and considered in my medical decision making (see chart for details).     84 year old female history of hyperlipidemia, TIA takes daily aspirin presents the emergency department status post unwitnessed fall was mechanical in nature.  Patient recalls all the events that happened prior to arrival.  Patient states slipping on a carpet landing on a hard floor on her right side.  Patient said striking the right side of  the face along with right hip right elbow.  Patient arrives hemodynamically stable/well-appearing.  Prior to arrival she was given 100 mcg of fentanyl and is currently pain-free.  Patient denies any paresthesia, paralysis, muscular  weakness or back pain.  Baseline functional status is ambulation without assistance and she lives alone.  Patient arrived here medically stable otherwise well-appearing.  Physical exam as stated above concerning for right hip fracture.  X-ray of the right hip confirms right intratrochanteric hip fracture.  CT head without acute findings.  CT C-spine without acute findings.   Orthopedics consulted n.p.o. at midnight plan for case tomorrow.  Plan for admission to hospitalist.  Final Clinical Impressions(s) / ED Diagnoses   Final diagnoses:  Closed fracture of hip, unspecified laterality, initial encounter [facility])  Fall, initial encounter    ED Discharge Orders    None       Willette Alma, DO 05/31/17 1149

## 2017-05-30 NOTE — ED Provider Notes (Signed)
I saw and evaluated the patient, reviewed the resident's note and I agree with the findings and plan.   EKG Interpretation None     This is a 84 year old female had a mechanical fall just prior to arrival and fell onto her right hip.  She could not ambulate.  On exam here her right leg is shortened and rotated.  Clinically she has a fracture.  Awaiting x-rays and will likely consult orthopedics   Lacretia Leigh, MD 05/30/17 1709

## 2017-05-30 NOTE — H&P (View-Only) (Signed)
ORTHOPAEDIC CONSULTATION  REQUESTING PHYSICIAN: Lacretia Leigh, MD  Chief Complaint: R hip pain  HPI: Kimberly Ramirez is a 84 y.o. female with mechanical fall talking on the telephone resulting in immediate right hip pain patient is accompanied by Dr. Alphonsa Overall her son-in-law as well as her daughter.  She had no loss of consciousness and no other areas of injury.  She is relatively healthy and is currently in some pain but relatively well controlled.  History reviewed. No pertinent past medical history. Past Surgical History:  Procedure Laterality Date  . ABDOMINAL HYSTERECTOMY     Social History   Socioeconomic History  . Marital status: Widowed    Spouse name: None  . Number of children: None  . Years of education: None  . Highest education level: None  Social Needs  . Financial resource strain: None  . Food insecurity - worry: None  . Food insecurity - inability: None  . Transportation needs - medical: None  . Transportation needs - non-medical: None  Occupational History  . None  Tobacco Use  . Smoking status: Never Smoker  . Smokeless tobacco: Never Used  Substance and Sexual Activity  . Alcohol use: No  . Drug use: No  . Sexual activity: None  Other Topics Concern  . None  Social History Narrative  . None   Family History  Problem Relation Age of Onset  . Parkinson's disease Mother   . Heart Problems Father    No Known Allergies Prior to Admission medications   Medication Sig Start Date End Date Taking? Authorizing Provider  aspirin 325 MG tablet Take 1 tablet (325 mg total) by mouth daily. 05/17/16   Theodis Blaze, MD  atorvastatin (LIPITOR) 40 MG tablet Take 1 tablet (40 mg total) by mouth daily at 6 PM. 05/17/16   Theodis Blaze, MD  levofloxacin (LEVAQUIN) 500 MG tablet Take 1 tablet (500 mg total) by mouth daily. 05/17/16   Theodis Blaze, MD   Dg Chest 1 View  Result Date: 05/30/2017 CLINICAL DATA:  Fall, hip pain EXAM: CHEST 1 VIEW COMPARISON:   None. FINDINGS: Heart size and mediastinal contours are within normal limits. Coarse lung markings bilaterally suggests underlying chronic interstitial lung disease. No confluent opacity to suggest pneumonia. No pleural effusion or pneumothorax seen. No acute or suspicious osseous finding. IMPRESSION: No active disease. No evidence of pneumonia or pulmonary edema. Suspected chronic interstitial lung disease. Electronically Signed   By: Franki Cabot M.D.   On: 05/30/2017 18:02   Ct Head Wo Contrast  Result Date: 05/30/2017 CLINICAL DATA:  84 y/o  F; fall with head injury. EXAM: CT HEAD WITHOUT CONTRAST CT CERVICAL SPINE WITHOUT CONTRAST TECHNIQUE: Multidetector CT imaging of the head and cervical spine was performed following the standard protocol without intravenous contrast. Multiplanar CT image reconstructions of the cervical spine were also generated. COMPARISON:  05/16/2016 CT head 05/17/2016 MRI head. FINDINGS: CT HEAD FINDINGS Brain: No evidence of acute infarction, hemorrhage, hydrocephalus, extra-axial collection or mass lesion/mass effect. Stable moderate chronic microvascular ischemic changes and parenchymal volume loss of the brain given differences in technique. Small chronic lacunar infarction within the left thalamus. Vascular: Calcific atherosclerosis of carotid siphons. No hyperdense vessel identified. Skull: Right parietal region small scalp contusion. No calvarial fracture. Sinuses/Orbits: Left maxillary sinus extensive mucosal thickening in chronic inflammatory changes of the sinus walls. Bilateral intra-ocular lens replacement. Otherwise negative. Other: None. CT CERVICAL SPINE FINDINGS Alignment: Normal cervical lordosis without listhesis. Leftward curvature  at cervicothoracic junction. Skull base and vertebrae: No acute fracture. No primary bone lesion or focal pathologic process. Soft tissues and spinal canal: No prevertebral fluid or swelling. No visible canal hematoma. Disc levels:  Multilevel disc and facet degenerative changes greatest at the C4-5 and C5-6 levels. Uncovertebral and facet hypertrophy results in moderate bilateral bony foraminal stenosis at the C5-6 level and mild left-sided stenosis at the C4-5 level. 5 mm central C4-5 disc protrusion mild-to-moderate canal stenosis and anterior cord impingement. Upper chest: Biapical pleuroparenchymal scarring. Other: Negative. IMPRESSION: CT head: 1. No acute intracranial abnormality or calvarial fracture identified. 2. Right parietal region small scalp contusion. 3. Stable moderate chronic microvascular ischemic changes and parenchymal volume loss of the brain given differences in technique. 4. Extensive left chronic maxillary sinus disease. CT cervical spine: 1. No acute fracture or dislocation identified. 2. Cervical spondylosis greatest at C4-5 and C5-6 levels. 3. C4-5 central disc protrusion with mild-to-moderate canal stenosis and anterior cord impingement. Electronically Signed   By: Kristine Garbe M.D.   On: 05/30/2017 18:50   Ct Cervical Spine Wo Contrast  Result Date: 05/30/2017 CLINICAL DATA:  84 y/o  F; fall with head injury. EXAM: CT HEAD WITHOUT CONTRAST CT CERVICAL SPINE WITHOUT CONTRAST TECHNIQUE: Multidetector CT imaging of the head and cervical spine was performed following the standard protocol without intravenous contrast. Multiplanar CT image reconstructions of the cervical spine were also generated. COMPARISON:  05/16/2016 CT head 05/17/2016 MRI head. FINDINGS: CT HEAD FINDINGS Brain: No evidence of acute infarction, hemorrhage, hydrocephalus, extra-axial collection or mass lesion/mass effect. Stable moderate chronic microvascular ischemic changes and parenchymal volume loss of the brain given differences in technique. Small chronic lacunar infarction within the left thalamus. Vascular: Calcific atherosclerosis of carotid siphons. No hyperdense vessel identified. Skull: Right parietal region small scalp  contusion. No calvarial fracture. Sinuses/Orbits: Left maxillary sinus extensive mucosal thickening in chronic inflammatory changes of the sinus walls. Bilateral intra-ocular lens replacement. Otherwise negative. Other: None. CT CERVICAL SPINE FINDINGS Alignment: Normal cervical lordosis without listhesis. Leftward curvature at cervicothoracic junction. Skull base and vertebrae: No acute fracture. No primary bone lesion or focal pathologic process. Soft tissues and spinal canal: No prevertebral fluid or swelling. No visible canal hematoma. Disc levels: Multilevel disc and facet degenerative changes greatest at the C4-5 and C5-6 levels. Uncovertebral and facet hypertrophy results in moderate bilateral bony foraminal stenosis at the C5-6 level and mild left-sided stenosis at the C4-5 level. 5 mm central C4-5 disc protrusion mild-to-moderate canal stenosis and anterior cord impingement. Upper chest: Biapical pleuroparenchymal scarring. Other: Negative. IMPRESSION: CT head: 1. No acute intracranial abnormality or calvarial fracture identified. 2. Right parietal region small scalp contusion. 3. Stable moderate chronic microvascular ischemic changes and parenchymal volume loss of the brain given differences in technique. 4. Extensive left chronic maxillary sinus disease. CT cervical spine: 1. No acute fracture or dislocation identified. 2. Cervical spondylosis greatest at C4-5 and C5-6 levels. 3. C4-5 central disc protrusion with mild-to-moderate canal stenosis and anterior cord impingement. Electronically Signed   By: Kristine Garbe M.D.   On: 05/30/2017 18:50   Dg Hip Unilat W Or Wo Pelvis 1 View Right  Result Date: 05/30/2017 CLINICAL DATA:  Fall.  Right hip pain. EXAM: DG HIP (WITH OR WITHOUT PELVIS) 1V RIGHT COMPARISON:  None. FINDINGS: Frontal pelvis shows diffuse demineralization. AP and frog-leg lateral views of the right hip reveal comminuted intertrochanteric right femoral neck fracture with varus  angulation. SI joints and symphysis pubis unremarkable. IMPRESSION: Comminuted  intertrochanteric right femoral neck fracture with varus angulation. Electronically Signed   By: Misty Stanley M.D.   On: 05/30/2017 17:57   Dg Femur 1 View Right  Result Date: 05/30/2017 CLINICAL DATA:  Fall, right hip pain EXAM: RIGHT FEMUR 1 VIEW COMPARISON:  None. FINDINGS: Displaced/comminuted fracture of the right femoral neck, intertrochanteric. Right femoral head remains well positioned relative to the acetabulum. Distal femur appears intact and normally aligned. IMPRESSION: Displaced/comminuted fracture of the right femoral neck, intertrochanteric, with nearly 90 degrees angulation at the fracture site. Electronically Signed   By: Franki Cabot M.D.   On: 05/30/2017 18:01   Family History Reviewed and non-contributory, no pertinent history of problems with bleeding or anesthesia      Review of Systems 14 system ROS conducted and negative except for that noted in HPI   OBJECTIVE  Vitals: Patient Vitals for the past 8 hrs:  BP Temp Temp src Pulse Resp SpO2 Height Weight  05/30/17 1900 (!) 147/76 - - 86 16 100 % - -  05/30/17 1845 140/60 - - 84 18 100 % - -  05/30/17 1730 (!) 155/64 - - 91 14 100 % - -  05/30/17 1700 (!) 155/66 - - 89 19 100 % - -  05/30/17 1645 (!) 176/70 - - 97 17 99 % - -  05/30/17 1643 (!) 176/70 97.6 F (36.4 C) Oral 95 18 100 % - -  05/30/17 1639 - - - - - - 5' 5.5" (1.664 m) 118 lb (53.5 kg)   General: Alert, no acute distress Cardiovascular: No pedal edema Respiratory: No cyanosis, no use of accessory musculature GI: No organomegaly, abdomen is soft and non-tender Skin: No lesions in the area of chief complaint other than those listed below in MSK exam.  Neurologic: Sensation intact distally save for the below mentioned MSK exam Psychiatric: Patient is competent for consent with normal mood and affect Lymphatic: No axillary or cervical lymphadenopathy Extremities  RLE:  Shortened and externally rotated.  ROM deferred. + GS/TA/EHL. Sensation intact in DP/SP/S/S/P distributions. 2+ DP pulse with warm and well perfused digits. Compartments soft and compressible, with no pain on passive stretch.     Test Results Imaging X-rays reviewed demonstrate a comminuted three-part intertrochanteric femur fracture on the right.  Labs cbc Recent Labs    05/30/17 1837  WBC 12.9*  HGB 11.1*  HCT 34.7*  PLT 190    Labs inflam No results for input(s): CRP in the last 72 hours.  Invalid input(s): ESR  Labs coag Recent Labs    05/30/17 1837  INR 1.01    Recent Labs    05/30/17 1837  NA 136  K 4.2  CL 102  CO2 21*  GLUCOSE 136*  BUN 15  CREATININE 0.81  CALCIUM 8.9     ASSESSMENT AND PLAN: 84 y.o. female with the following: Right intertrochanteric femur fracture  Orthopedics recommends admission to a medical service and we will provide consultation and follow along  Discussed options and non-operative versus operative measures.  Non-operative measures have a predictably poor set of outcomes in an ambulatory patient including bedsores and other medical complications.  Understanding this the patient/family elected to proceed with operative measures.  The risks and benefits of  surgical intervention including infection, bleeding, nerve injury, periprosthetic fracture, the need for revision surgery, leg length discrepancy, gait change, blood clots, cardiopulmonary complications, morbidity, mortality, among others, and they were willing to proceed.    Plan for cephalo-medullary nail placement tomorrow if  possible.  If medically clear we will do our best to get it done as soon as possible.   -N.p.o. at midnight -Hold anticoagulation until postop

## 2017-05-30 NOTE — ED Notes (Signed)
Attempted report x1. 

## 2017-05-30 NOTE — Consult Note (Signed)
ORTHOPAEDIC CONSULTATION  REQUESTING PHYSICIAN: Lacretia Leigh, MD  Chief Complaint: R hip pain  HPI: Kimberly Ramirez is a 84 y.o. female with mechanical fall talking on the telephone resulting in immediate right hip pain patient is accompanied by Dr. Alphonsa Overall her son-in-law as well as her daughter.  She had no loss of consciousness and no other areas of injury.  She is relatively healthy and is currently in some pain but relatively well controlled.  History reviewed. No pertinent past medical history. Past Surgical History:  Procedure Laterality Date  . ABDOMINAL HYSTERECTOMY     Social History   Socioeconomic History  . Marital status: Widowed    Spouse name: None  . Number of children: None  . Years of education: None  . Highest education level: None  Social Needs  . Financial resource strain: None  . Food insecurity - worry: None  . Food insecurity - inability: None  . Transportation needs - medical: None  . Transportation needs - non-medical: None  Occupational History  . None  Tobacco Use  . Smoking status: Never Smoker  . Smokeless tobacco: Never Used  Substance and Sexual Activity  . Alcohol use: No  . Drug use: No  . Sexual activity: None  Other Topics Concern  . None  Social History Narrative  . None   Family History  Problem Relation Age of Onset  . Parkinson's disease Mother   . Heart Problems Father    No Known Allergies Prior to Admission medications   Medication Sig Start Date End Date Taking? Authorizing Provider  aspirin 325 MG tablet Take 1 tablet (325 mg total) by mouth daily. 05/17/16   Theodis Blaze, MD  atorvastatin (LIPITOR) 40 MG tablet Take 1 tablet (40 mg total) by mouth daily at 6 PM. 05/17/16   Theodis Blaze, MD  levofloxacin (LEVAQUIN) 500 MG tablet Take 1 tablet (500 mg total) by mouth daily. 05/17/16   Theodis Blaze, MD   Dg Chest 1 View  Result Date: 05/30/2017 CLINICAL DATA:  Fall, hip pain EXAM: CHEST 1 VIEW COMPARISON:   None. FINDINGS: Heart size and mediastinal contours are within normal limits. Coarse lung markings bilaterally suggests underlying chronic interstitial lung disease. No confluent opacity to suggest pneumonia. No pleural effusion or pneumothorax seen. No acute or suspicious osseous finding. IMPRESSION: No active disease. No evidence of pneumonia or pulmonary edema. Suspected chronic interstitial lung disease. Electronically Signed   By: Franki Cabot M.D.   On: 05/30/2017 18:02   Ct Head Wo Contrast  Result Date: 05/30/2017 CLINICAL DATA:  84 y/o  F; fall with head injury. EXAM: CT HEAD WITHOUT CONTRAST CT CERVICAL SPINE WITHOUT CONTRAST TECHNIQUE: Multidetector CT imaging of the head and cervical spine was performed following the standard protocol without intravenous contrast. Multiplanar CT image reconstructions of the cervical spine were also generated. COMPARISON:  05/16/2016 CT head 05/17/2016 MRI head. FINDINGS: CT HEAD FINDINGS Brain: No evidence of acute infarction, hemorrhage, hydrocephalus, extra-axial collection or mass lesion/mass effect. Stable moderate chronic microvascular ischemic changes and parenchymal volume loss of the brain given differences in technique. Small chronic lacunar infarction within the left thalamus. Vascular: Calcific atherosclerosis of carotid siphons. No hyperdense vessel identified. Skull: Right parietal region small scalp contusion. No calvarial fracture. Sinuses/Orbits: Left maxillary sinus extensive mucosal thickening in chronic inflammatory changes of the sinus walls. Bilateral intra-ocular lens replacement. Otherwise negative. Other: None. CT CERVICAL SPINE FINDINGS Alignment: Normal cervical lordosis without listhesis. Leftward curvature  at cervicothoracic junction. Skull base and vertebrae: No acute fracture. No primary bone lesion or focal pathologic process. Soft tissues and spinal canal: No prevertebral fluid or swelling. No visible canal hematoma. Disc levels:  Multilevel disc and facet degenerative changes greatest at the C4-5 and C5-6 levels. Uncovertebral and facet hypertrophy results in moderate bilateral bony foraminal stenosis at the C5-6 level and mild left-sided stenosis at the C4-5 level. 5 mm central C4-5 disc protrusion mild-to-moderate canal stenosis and anterior cord impingement. Upper chest: Biapical pleuroparenchymal scarring. Other: Negative. IMPRESSION: CT head: 1. No acute intracranial abnormality or calvarial fracture identified. 2. Right parietal region small scalp contusion. 3. Stable moderate chronic microvascular ischemic changes and parenchymal volume loss of the brain given differences in technique. 4. Extensive left chronic maxillary sinus disease. CT cervical spine: 1. No acute fracture or dislocation identified. 2. Cervical spondylosis greatest at C4-5 and C5-6 levels. 3. C4-5 central disc protrusion with mild-to-moderate canal stenosis and anterior cord impingement. Electronically Signed   By: Kristine Garbe M.D.   On: 05/30/2017 18:50   Ct Cervical Spine Wo Contrast  Result Date: 05/30/2017 CLINICAL DATA:  84 y/o  F; fall with head injury. EXAM: CT HEAD WITHOUT CONTRAST CT CERVICAL SPINE WITHOUT CONTRAST TECHNIQUE: Multidetector CT imaging of the head and cervical spine was performed following the standard protocol without intravenous contrast. Multiplanar CT image reconstructions of the cervical spine were also generated. COMPARISON:  05/16/2016 CT head 05/17/2016 MRI head. FINDINGS: CT HEAD FINDINGS Brain: No evidence of acute infarction, hemorrhage, hydrocephalus, extra-axial collection or mass lesion/mass effect. Stable moderate chronic microvascular ischemic changes and parenchymal volume loss of the brain given differences in technique. Small chronic lacunar infarction within the left thalamus. Vascular: Calcific atherosclerosis of carotid siphons. No hyperdense vessel identified. Skull: Right parietal region small scalp  contusion. No calvarial fracture. Sinuses/Orbits: Left maxillary sinus extensive mucosal thickening in chronic inflammatory changes of the sinus walls. Bilateral intra-ocular lens replacement. Otherwise negative. Other: None. CT CERVICAL SPINE FINDINGS Alignment: Normal cervical lordosis without listhesis. Leftward curvature at cervicothoracic junction. Skull base and vertebrae: No acute fracture. No primary bone lesion or focal pathologic process. Soft tissues and spinal canal: No prevertebral fluid or swelling. No visible canal hematoma. Disc levels: Multilevel disc and facet degenerative changes greatest at the C4-5 and C5-6 levels. Uncovertebral and facet hypertrophy results in moderate bilateral bony foraminal stenosis at the C5-6 level and mild left-sided stenosis at the C4-5 level. 5 mm central C4-5 disc protrusion mild-to-moderate canal stenosis and anterior cord impingement. Upper chest: Biapical pleuroparenchymal scarring. Other: Negative. IMPRESSION: CT head: 1. No acute intracranial abnormality or calvarial fracture identified. 2. Right parietal region small scalp contusion. 3. Stable moderate chronic microvascular ischemic changes and parenchymal volume loss of the brain given differences in technique. 4. Extensive left chronic maxillary sinus disease. CT cervical spine: 1. No acute fracture or dislocation identified. 2. Cervical spondylosis greatest at C4-5 and C5-6 levels. 3. C4-5 central disc protrusion with mild-to-moderate canal stenosis and anterior cord impingement. Electronically Signed   By: Kristine Garbe M.D.   On: 05/30/2017 18:50   Dg Hip Unilat W Or Wo Pelvis 1 View Right  Result Date: 05/30/2017 CLINICAL DATA:  Fall.  Right hip pain. EXAM: DG HIP (WITH OR WITHOUT PELVIS) 1V RIGHT COMPARISON:  None. FINDINGS: Frontal pelvis shows diffuse demineralization. AP and frog-leg lateral views of the right hip reveal comminuted intertrochanteric right femoral neck fracture with varus  angulation. SI joints and symphysis pubis unremarkable. IMPRESSION: Comminuted  intertrochanteric right femoral neck fracture with varus angulation. Electronically Signed   By: Misty Stanley M.D.   On: 05/30/2017 17:57   Dg Femur 1 View Right  Result Date: 05/30/2017 CLINICAL DATA:  Fall, right hip pain EXAM: RIGHT FEMUR 1 VIEW COMPARISON:  None. FINDINGS: Displaced/comminuted fracture of the right femoral neck, intertrochanteric. Right femoral head remains well positioned relative to the acetabulum. Distal femur appears intact and normally aligned. IMPRESSION: Displaced/comminuted fracture of the right femoral neck, intertrochanteric, with nearly 90 degrees angulation at the fracture site. Electronically Signed   By: Franki Cabot M.D.   On: 05/30/2017 18:01   Family History Reviewed and non-contributory, no pertinent history of problems with bleeding or anesthesia      Review of Systems 14 system ROS conducted and negative except for that noted in HPI   OBJECTIVE  Vitals: Patient Vitals for the past 8 hrs:  BP Temp Temp src Pulse Resp SpO2 Height Weight  05/30/17 1900 (!) 147/76 - - 86 16 100 % - -  05/30/17 1845 140/60 - - 84 18 100 % - -  05/30/17 1730 (!) 155/64 - - 91 14 100 % - -  05/30/17 1700 (!) 155/66 - - 89 19 100 % - -  05/30/17 1645 (!) 176/70 - - 97 17 99 % - -  05/30/17 1643 (!) 176/70 97.6 F (36.4 C) Oral 95 18 100 % - -  05/30/17 1639 - - - - - - 5' 5.5" (1.664 m) 118 lb (53.5 kg)   General: Alert, no acute distress Cardiovascular: No pedal edema Respiratory: No cyanosis, no use of accessory musculature GI: No organomegaly, abdomen is soft and non-tender Skin: No lesions in the area of chief complaint other than those listed below in MSK exam.  Neurologic: Sensation intact distally save for the below mentioned MSK exam Psychiatric: Patient is competent for consent with normal mood and affect Lymphatic: No axillary or cervical lymphadenopathy Extremities  RLE:  Shortened and externally rotated.  ROM deferred. + GS/TA/EHL. Sensation intact in DP/SP/S/S/P distributions. 2+ DP pulse with warm and well perfused digits. Compartments soft and compressible, with no pain on passive stretch.     Test Results Imaging X-rays reviewed demonstrate a comminuted three-part intertrochanteric femur fracture on the right.  Labs cbc Recent Labs    05/30/17 1837  WBC 12.9*  HGB 11.1*  HCT 34.7*  PLT 190    Labs inflam No results for input(s): CRP in the last 72 hours.  Invalid input(s): ESR  Labs coag Recent Labs    05/30/17 1837  INR 1.01    Recent Labs    05/30/17 1837  NA 136  K 4.2  CL 102  CO2 21*  GLUCOSE 136*  BUN 15  CREATININE 0.81  CALCIUM 8.9     ASSESSMENT AND PLAN: 84 y.o. female with the following: Right intertrochanteric femur fracture  Orthopedics recommends admission to a medical service and we will provide consultation and follow along  Discussed options and non-operative versus operative measures.  Non-operative measures have a predictably poor set of outcomes in an ambulatory patient including bedsores and other medical complications.  Understanding this the patient/family elected to proceed with operative measures.  The risks and benefits of  surgical intervention including infection, bleeding, nerve injury, periprosthetic fracture, the need for revision surgery, leg length discrepancy, gait change, blood clots, cardiopulmonary complications, morbidity, mortality, among others, and they were willing to proceed.    Plan for cephalo-medullary nail placement tomorrow if  possible.  If medically clear we will do our best to get it done as soon as possible.   -N.p.o. at midnight -Hold anticoagulation until postop

## 2017-05-30 NOTE — H&P (Signed)
Kimberly Ramirez:[id number] DOB: [birth year] DOA: 05/30/2017     PCP: Lajean Manes, MD   Outpatient Specialists: none   Patient coming from:    home Lives alone,        Chief Complaint: fall On the right hip  HPI: Kimberly Ramirez is a 84 y.o. female with medical history significant of recent TIA     Presented with mechanical fall earlier today resulting in shortened right leg unable to ambulate Patient reportedly was trying to sit down on the chair tripped on the rug but missed it and fell backwards on the floor    Reports some recent dysuria, and constipation No syncope, reports slight  head injury Of note patient was admitted for TIA 1 year ago at that time echogram showed preserved EF she was started on full dose aspirin 325 but she have self discontinued  Denies chest pain. No shortness of breath. She is able to walk up a flight of stairs or a block.  Reports she stopped taking all of her medications.   While in [place] been seen by orthopedics in ER plant to operate tomorrow afternoon  Significant initial  Findings: WBC 12.9 Hg 11.1 plt 190 INR 1.01  R hip comminuted intertrochanteric right femoral neck fracture with varus angulation  CT head non-acute CT neck non-acute CXR non -acute  ER provider discussed case with:  Orthopedics Dax Varkey  With Raliegh Ip Who recommends: medical admission We'll see patient in consult in the ER    IN ER:  Temp (24hrs), Avg:97.6 F (36.4 C), Min:97.6 F (36.4 C), Max:97.6 F (36.4 C)      on arrival  ED Triage Vitals  Enc Vitals Group     BP 05/30/17 1643 (!) 176/70     Pulse Rate 05/30/17 1643 95     Resp 05/30/17 1643 18     Temp 05/30/17 1643 97.6 F (36.4 C)     Temp Source 05/30/17 1643 Oral     SpO2 05/30/17 1643 100 %     Weight 05/30/17 1639 118 lb (53.5 kg)     Height 05/30/17 1639 5' 5.5" (1.664 m)     Head Circumference --      Peak Flow --      Pain Score 05/30/17 1646 0     Pain Loc --      Pain  Edu? --      Excl. in [place]? --     Latest RR 16 100% HR 86 BP 147/76  Following Medications were ordered in ER: Medications - No data to display    Hospitalist was called for admission for right intertrochanteric hip fracture     Review of Systems:    Pertinent positives include: Right hip pain, constipation dysuria Constitutional:  No weight loss, night sweats, Fevers, chills, fatigue, weight loss  HEENT:  No headaches, Difficulty swallowing,Tooth/dental problems,Sore throat,  No sneezing, itching, ear ache, nasal congestion, post nasal drip,  Cardio-vascular:  No chest pain, Orthopnea, PND, anasarca, dizziness, palpitations.no Bilateral lower extremity swelling  GI:  No heartburn, indigestion, abdominal pain, nausea, vomiting, diarrhea, change in bowel habits, loss of appetite, melena, blood in stool, hematemesis Resp:  no shortness of breath at rest. No dyspnea on exertion, No excess mucus, no productive cough, No non-productive cough, No coughing up of blood.No change in color of mucus.No wheezing. Skin:  no rash or lesions. No jaundice GU:  no , change in color of urine, no urgency or frequency. No straining  to urinate.  No flank pain.  Musculoskeletal:  No joint pain or no joint swelling. No decreased range of motion. No back pain.  Psych:  No change in mood or affect. No depression or anxiety. No memory loss.  Neuro: no localizing neurological complaints, no tingling, no weakness, no double vision, no gait abnormality, no slurred speech, no confusion  As per HPI otherwise 10 point review of systems negative.   Past Medical History: History reviewed. No pertinent past medical history. Past Surgical History:  Procedure Laterality Date  . ABDOMINAL HYSTERECTOMY       Social History:  Ambulatory  independently     reports that  has never smoked. she has never used smokeless tobacco. She reports that she does not drink alcohol or use drugs.  Allergies:  No  Known Allergies     Family History:   Family History  Problem Relation Age of Onset  . Parkinson's disease Mother   . Heart Problems Father     Medications: Prior to Admission medications   Medication Sig Start Date End Date Taking? Authorizing Provider  aspirin 325 MG tablet Take 1 tablet (325 mg total) by mouth daily. 05/17/16   Theodis Blaze, MD  atorvastatin (LIPITOR) 40 MG tablet Take 1 tablet (40 mg total) by mouth daily at 6 PM. 05/17/16   Theodis Blaze, MD  levofloxacin (LEVAQUIN) 500 MG tablet Take 1 tablet (500 mg total) by mouth daily. 05/17/16   Theodis Blaze, MD    Physical Exam: Patient Vitals for the past 24 hrs:  BP Temp Temp src Pulse Resp SpO2 Height Weight  05/30/17 1900 (!) 147/76 - - 86 16 100 % - -  05/30/17 1845 140/60 - - 84 18 100 % - -  05/30/17 1730 (!) 155/64 - - 91 14 100 % - -  05/30/17 1700 (!) 155/66 - - 89 19 100 % - -  05/30/17 1645 (!) 176/70 - - 97 17 99 % - -  05/30/17 1643 (!) 176/70 97.6 F (36.4 C) Oral 95 18 100 % - -  05/30/17 1639 - - - - - - 5' 5.5" (1.664 m) 53.5 kg (118 lb)    1. General:  in No Acute distress  well  -appearing 2. Psychological: Alert and    Oriented 3. Head/ENT:     Dry Mucous Membranes                          Head Non traumatic, neck supple                            Poor Dentition 4. SKIN:   decreased Skin turgor,  Skin clean Dry and intact no rash 5. Heart: Regular rate and rhythm no Murmur, no Rub or gallop 6. Lungs: Clear to auscultation bilaterally, no wheezes or crackles   7. Abdomen: Soft,  non-tender, Non distended  bowel sounds present 8. Lower extremities: no clubbing, cyanosis, or edema 9. Neurologically Grossly intact, moving all 4 extremities equally   10. MSK: Normal range of motion, limited in right lower extremity   body mass index is 19.34 kg/m.  Labs on Admission:   Labs on Admission: I have personally reviewed following labs and imaging studies  CBC: Recent Labs  Lab  05/30/17 1837  WBC 12.9*  NEUTROABS 11.3*  HGB 11.1*  HCT 34.7*  MCV 96.4  PLT 190  Basic Metabolic Panel: No results for input(s): NA, K, CL, CO2, GLUCOSE, BUN, CREATININE, CALCIUM, MG, PHOS in the last 168 hours. GFR: CrCl cannot be calculated (Patient's most recent lab result is older than the maximum 21 days allowed.). Liver Function Tests: No results for input(s): AST, ALT, ALKPHOS, BILITOT, PROT, ALBUMIN in the last 168 hours. No results for input(s): LIPASE, AMYLASE in the last 168 hours. No results for input(s): AMMONIA in the last 168 hours. Coagulation Profile: No results for input(s): INR, PROTIME in the last 168 hours. Cardiac Enzymes: No results for input(s): CKTOTAL, CKMB, CKMBINDEX, TROPONINI in the last 168 hours. BNP (last 3 results) No results for input(s): PROBNP in the last 8760 hours. HbA1C: No results for input(s): HGBA1C in the last 72 hours. CBG: No results for input(s): GLUCAP in the last 168 hours. Lipid Profile: No results for input(s): CHOL, HDL, LDLCALC, TRIG, CHOLHDL, LDLDIRECT in the last 72 hours. Thyroid Function Tests: No results for input(s): TSH, T4TOTAL, FREET4, T3FREE, THYROIDAB in the last 72 hours. Anemia Panel: No results for input(s): VITAMINB12, FOLATE, FERRITIN, TIBC, IRON, RETICCTPCT in the last 72 hours. Urine analysis:  Sepsis Labs: @LABRCNTIP (procalcitonin:4,lacticidven:4) )No results found for this or any previous visit (from the past 240 hour(s)).    UA   ordered  Lab Results  Component Value Date   HGBA1C 5.4 05/17/2016    CrCl cannot be calculated (Patient's most recent lab result is older than the maximum 21 days allowed.).  BNP (last 3 results) No results for input(s): PROBNP in the last 8760 hours.   ECG REPORT ordered  [facility] Weights   05/30/17 1639  Weight: 53.5 kg (118 lb)     Cultures:    Component Value Date/Time   SDES URINE, RANDOM 05/17/2016 1443   SPECREQUEST NONE 05/17/2016 1443   CULT  (A) 05/17/2016 1443    >=100,000 COLONIES/mL ESCHERICHIA COLI Confirmed Extended Spectrum Beta-Lactamase Producer (ESBL)    REPTSTATUS 05/19/2016 FINAL 05/17/2016 1443     Radiological Exams on Admission: Dg Chest 1 View  Result Date: 05/30/2017 CLINICAL DATA:  Fall, hip pain EXAM: CHEST 1 VIEW COMPARISON:  None. FINDINGS: Heart size and mediastinal contours are within normal limits. Coarse lung markings bilaterally suggests underlying chronic interstitial lung disease. No confluent opacity to suggest pneumonia. No pleural effusion or pneumothorax seen. No acute or suspicious osseous finding. IMPRESSION: No active disease. No evidence of pneumonia or pulmonary edema. Suspected chronic interstitial lung disease. Electronically Signed   By: Franki Cabot M.D.   On: 05/30/2017 18:02   Ct Head Wo Contrast  Result Date: 05/30/2017 CLINICAL DATA:  84 y/o  F; fall with head injury. EXAM: CT HEAD WITHOUT CONTRAST CT CERVICAL SPINE WITHOUT CONTRAST TECHNIQUE: Multidetector CT imaging of the head and cervical spine was performed following the standard protocol without intravenous contrast. Multiplanar CT image reconstructions of the cervical spine were also generated. COMPARISON:  05/16/2016 CT head 05/17/2016 MRI head. FINDINGS: CT HEAD FINDINGS Brain: No evidence of acute infarction, hemorrhage, hydrocephalus, extra-axial collection or mass lesion/mass effect. Stable moderate chronic microvascular ischemic changes and parenchymal volume loss of the brain given differences in technique. Small chronic lacunar infarction within the left thalamus. Vascular: Calcific atherosclerosis of carotid siphons. No hyperdense vessel identified. Skull: Right parietal region small scalp contusion. No calvarial fracture. Sinuses/Orbits: Left maxillary sinus extensive mucosal thickening in chronic inflammatory changes of the sinus walls. Bilateral intra-ocular lens replacement. Otherwise negative. Other: None. CT CERVICAL SPINE  FINDINGS Alignment: Normal cervical lordosis without  listhesis. Leftward curvature at cervicothoracic junction. Skull base and vertebrae: No acute fracture. No primary bone lesion or focal pathologic process. Soft tissues and spinal canal: No prevertebral fluid or swelling. No visible canal hematoma. Disc levels: Multilevel disc and facet degenerative changes greatest at the C4-5 and C5-6 levels. Uncovertebral and facet hypertrophy results in moderate bilateral bony foraminal stenosis at the C5-6 level and mild left-sided stenosis at the C4-5 level. 5 mm central C4-5 disc protrusion mild-to-moderate canal stenosis and anterior cord impingement. Upper chest: Biapical pleuroparenchymal scarring. Other: Negative. IMPRESSION: CT head: 1. No acute intracranial abnormality or calvarial fracture identified. 2. Right parietal region small scalp contusion. 3. Stable moderate chronic microvascular ischemic changes and parenchymal volume loss of the brain given differences in technique. 4. Extensive left chronic maxillary sinus disease. CT cervical spine: 1. No acute fracture or dislocation identified. 2. Cervical spondylosis greatest at C4-5 and C5-6 levels. 3. C4-5 central disc protrusion with mild-to-moderate canal stenosis and anterior cord impingement. Electronically Signed   By: Kristine Garbe M.D.   On: 05/30/2017 18:50   Ct Cervical Spine Wo Contrast  Result Date: 05/30/2017 CLINICAL DATA:  84 y/o  F; fall with head injury. EXAM: CT HEAD WITHOUT CONTRAST CT CERVICAL SPINE WITHOUT CONTRAST TECHNIQUE: Multidetector CT imaging of the head and cervical spine was performed following the standard protocol without intravenous contrast. Multiplanar CT image reconstructions of the cervical spine were also generated. COMPARISON:  05/16/2016 CT head 05/17/2016 MRI head. FINDINGS: CT HEAD FINDINGS Brain: No evidence of acute infarction, hemorrhage, hydrocephalus, extra-axial collection or mass lesion/mass effect.  Stable moderate chronic microvascular ischemic changes and parenchymal volume loss of the brain given differences in technique. Small chronic lacunar infarction within the left thalamus. Vascular: Calcific atherosclerosis of carotid siphons. No hyperdense vessel identified. Skull: Right parietal region small scalp contusion. No calvarial fracture. Sinuses/Orbits: Left maxillary sinus extensive mucosal thickening in chronic inflammatory changes of the sinus walls. Bilateral intra-ocular lens replacement. Otherwise negative. Other: None. CT CERVICAL SPINE FINDINGS Alignment: Normal cervical lordosis without listhesis. Leftward curvature at cervicothoracic junction. Skull base and vertebrae: No acute fracture. No primary bone lesion or focal pathologic process. Soft tissues and spinal canal: No prevertebral fluid or swelling. No visible canal hematoma. Disc levels: Multilevel disc and facet degenerative changes greatest at the C4-5 and C5-6 levels. Uncovertebral and facet hypertrophy results in moderate bilateral bony foraminal stenosis at the C5-6 level and mild left-sided stenosis at the C4-5 level. 5 mm central C4-5 disc protrusion mild-to-moderate canal stenosis and anterior cord impingement. Upper chest: Biapical pleuroparenchymal scarring. Other: Negative. IMPRESSION: CT head: 1. No acute intracranial abnormality or calvarial fracture identified. 2. Right parietal region small scalp contusion. 3. Stable moderate chronic microvascular ischemic changes and parenchymal volume loss of the brain given differences in technique. 4. Extensive left chronic maxillary sinus disease. CT cervical spine: 1. No acute fracture or dislocation identified. 2. Cervical spondylosis greatest at C4-5 and C5-6 levels. 3. C4-5 central disc protrusion with mild-to-moderate canal stenosis and anterior cord impingement. Electronically Signed   By: Kristine Garbe M.D.   On: 05/30/2017 18:50   Dg Hip Unilat W Or Wo Pelvis 1 View  Right  Result Date: 05/30/2017 CLINICAL DATA:  Fall.  Right hip pain. EXAM: DG HIP (WITH OR WITHOUT PELVIS) 1V RIGHT COMPARISON:  None. FINDINGS: Frontal pelvis shows diffuse demineralization. AP and frog-leg lateral views of the right hip reveal comminuted intertrochanteric right femoral neck fracture with varus angulation. SI joints and symphysis pubis unremarkable.  IMPRESSION: Comminuted intertrochanteric right femoral neck fracture with varus angulation. Electronically Signed   By: Misty Stanley M.D.   On: 05/30/2017 17:57   Dg Femur 1 View Right  Result Date: 05/30/2017 CLINICAL DATA:  Fall, right hip pain EXAM: RIGHT FEMUR 1 VIEW COMPARISON:  None. FINDINGS: Displaced/comminuted fracture of the right femoral neck, intertrochanteric. Right femoral head remains well positioned relative to the acetabulum. Distal femur appears intact and normally aligned. IMPRESSION: Displaced/comminuted fracture of the right femoral neck, intertrochanteric, with nearly 90 degrees angulation at the fracture site. Electronically Signed   By: Franki Cabot M.D.   On: 05/30/2017 18:01    Chart has been reviewed    Assessment/Plan   84 y.o. female with medical history significant of recent TIA, Admitted for  right intertrochanteric hip fracture   Present on Admission: . Closed right hip fracture, initial encounter ([place]) -  - management as per orthopedics,  plan to operate  Tomorrow afternoon.  Keep nothing by mouth post midnight. Patient   not on anticoagulation or antiplatelet agents   Ordered type and screen, Place Foley, order a vitamin D level  Patient at baseline  able to walk a flight of stairs or 100 feet      Patient denies any chest pain or shortness of breath currently and/or with exertion,   ECG ordered  no known history of coronary artery disease,  COPD   Liver failure  CKD  Given advanced age patient is at least moderate  risk  which has been discussed with family but at this point no  furthther cardiac workup is indicated.    . Leukocytosis [place] secondary to stress response but will order UA given dysuria   . Dysuria will check UA and urine culture   Other plan as per orders.  DVT prophylaxis:  SCD   Code Status:  FULL CODE  as per patient    Family Communication:   Family   at  Bedside  plan of care was discussed with  Daughter  Disposition Plan:    likely will need placement for rehabilitation                                                Would benefit from PT/OT eval prior to DC order when stable                       Social Work                            Consults called: Orthopedics    Admission status:      Inpatient    Level of care     medical floor            I have spent a total of  56 min on this admission    Contrell Ballentine 05/30/2017, 9:14 PM    [facility]  Pager [phone]   after 2 AM please page floor coverage PA If 7AM-7PM, please contact the day team taking care of the patient  [email or web address]  Password [email or web address]

## 2017-05-30 NOTE — ED Triage Notes (Signed)
The pt asrrived by gems from home she was on the phone and attempted to sit I a chair that she missed  And she fell backward in the floor,  Painful rt hip with shortening  Iv lt forearm  Fentanyl  110mcg given by ems  No pain unless she is moved  Alert oriented skin warm and dry

## 2017-05-31 ENCOUNTER — Inpatient Hospital Stay (HOSPITAL_COMMUNITY): Payer: Medicare Other | Admitting: Certified Registered Nurse Anesthetist

## 2017-05-31 ENCOUNTER — Inpatient Hospital Stay (HOSPITAL_COMMUNITY): Payer: Medicare Other

## 2017-05-31 ENCOUNTER — Encounter (HOSPITAL_COMMUNITY): Payer: Self-pay | Admitting: Certified Registered Nurse Anesthetist

## 2017-05-31 ENCOUNTER — Encounter (HOSPITAL_COMMUNITY)
Admission: EM | Disposition: A | Discharge status: Skilled Nursing Facility | Payer: Self-pay | Source: Home / Self Care | Attending: Internal Medicine

## 2017-05-31 DIAGNOSIS — Z09 Encounter for follow-up examination after completed treatment for conditions other than malignant neoplasm: Secondary | ICD-10-CM

## 2017-05-31 DIAGNOSIS — S72009A Fracture of unspecified part of neck of unspecified femur, initial encounter for closed fracture: Secondary | ICD-10-CM

## 2017-05-31 HISTORY — PX: INTRAMEDULLARY (IM) NAIL INTERTROCHANTERIC: SHX5875

## 2017-05-31 HISTORY — PX: FEMUR IM NAIL: SHX1597

## 2017-05-31 LAB — CBC
HCT: 31.6 % — ABNORMAL LOW (ref 36.0–46.0)
HEMOGLOBIN: 10.2 g/dL — AB (ref 12.0–15.0)
MCH: 31 pg (ref 26.0–34.0)
MCHC: 32.3 g/dL (ref 30.0–36.0)
MCV: 96 fL (ref 78.0–100.0)
Platelets: 175 10*3/uL (ref 150–400)
RBC: 3.29 MIL/uL — AB (ref 3.87–5.11)
RDW: 12.2 % (ref 11.5–15.5)
WBC: 8.8 10*3/uL (ref 4.0–10.5)

## 2017-05-31 LAB — SURGICAL PCR SCREEN
MRSA, PCR: NEGATIVE
Staphylococcus aureus: NEGATIVE

## 2017-05-31 LAB — BASIC METABOLIC PANEL
ANION GAP: 9 (ref 5–15)
BUN: 13 mg/dL (ref 6–20)
CHLORIDE: 102 mmol/L (ref 101–111)
CO2: 24 mmol/L (ref 22–32)
CREATININE: 0.71 mg/dL (ref 0.44–1.00)
Calcium: 8.6 mg/dL — ABNORMAL LOW (ref 8.9–10.3)
GFR calc non Af Amer: >60 mL/min (ref >60)
Glucose, Bld: 114 mg/dL — ABNORMAL HIGH (ref 65–99)
POTASSIUM: 4 mmol/L (ref 3.5–5.1)
SODIUM: 135 mmol/L (ref 135–145)

## 2017-05-31 LAB — TYPE AND SCREEN
ABO/RH(D): O POS
ANTIBODY SCREEN: NEGATIVE

## 2017-05-31 LAB — ALBUMIN: ALBUMIN: 3.2 g/dL — AB (ref 3.5–5.0)

## 2017-05-31 LAB — CREATININE, SERUM
Creatinine, Ser: 0.76 mg/dL (ref 0.44–1.00)
GFR calc Af Amer: >60 mL/min (ref >60)
GFR calc non Af Amer: >60 mL/min (ref >60)

## 2017-05-31 LAB — ABO/RH: ABO/RH(D): O POS

## 2017-05-31 SURGERY — FIXATION, FRACTURE, INTERTROCHANTERIC, WITH INTRAMEDULLARY ROD
Anesthesia: General | Site: Hip | Laterality: Right

## 2017-05-31 MED ORDER — FENTANYL CITRATE (PF) 100 MCG/2ML IJ SOLN
INTRAMUSCULAR | Status: AC
  Administered 2017-05-31: 50 ug via INTRAVENOUS
  Filled 2017-05-31: qty 2

## 2017-05-31 MED ORDER — FENTANYL CITRATE (PF) 250 MCG/5ML IJ SOLN
INTRAMUSCULAR | Status: AC
  Filled 2017-05-31: qty 5

## 2017-05-31 MED ORDER — PHENYLEPHRINE HCL 10 MG/ML IJ SOLN
INTRAMUSCULAR | Status: DC | PRN
  Administered 2017-05-31 (×4): 80 ug via INTRAVENOUS

## 2017-05-31 MED ORDER — SUGAMMADEX SODIUM 200 MG/2ML IV SOLN
INTRAVENOUS | Status: DC | PRN
  Administered 2017-05-31: 99 mg via INTRAVENOUS

## 2017-05-31 MED ORDER — OXYCODONE HCL 5 MG PO TABS
5.0000 mg | ORAL_TABLET | ORAL | Status: DC | PRN
  Administered 2017-05-31 – 2017-06-02 (×4): 10 mg via ORAL
  Administered 2017-06-03: 5 mg via ORAL
  Filled 2017-05-31: qty 1
  Filled 2017-05-31 (×4): qty 2

## 2017-05-31 MED ORDER — PHENOL 1.4 % MT LIQD: 1.0000 | OROMUCOSAL | Status: DC | PRN

## 2017-05-31 MED ORDER — 0.9 % SODIUM CHLORIDE (POUR BTL) OPTIME
TOPICAL | Status: DC | PRN
  Administered 2017-05-31: 1000 mL

## 2017-05-31 MED ORDER — ONDANSETRON HCL 4 MG/2ML IJ SOLN
INTRAMUSCULAR | Status: DC | PRN
  Administered 2017-05-31: 4 mg via INTRAVENOUS

## 2017-05-31 MED ORDER — POLYETHYLENE GLYCOL 3350 17 G PO PACK
17.0000 g | PACK | Freq: Every day | ORAL | Status: DC
  Administered 2017-06-01 – 2017-06-03 (×3): 17 g via ORAL
  Filled 2017-05-31 (×3): qty 1

## 2017-05-31 MED ORDER — PROPOFOL 10 MG/ML IV BOLUS
INTRAVENOUS | Status: DC | PRN
  Administered 2017-05-31: 50 mg via INTRAVENOUS

## 2017-05-31 MED ORDER — METHOCARBAMOL 500 MG PO TABS
ORAL_TABLET | ORAL | Status: AC
  Administered 2017-05-31: 500 mg via ORAL
  Filled 2017-05-31: qty 1

## 2017-05-31 MED ORDER — MENTHOL 3 MG MT LOZG: 1.0000 | LOZENGE | OROMUCOSAL | Status: DC | PRN

## 2017-05-31 MED ORDER — CELECOXIB 100 MG PO CAPS
100.0000 mg | ORAL_CAPSULE | Freq: Every day | ORAL | Status: DC
  Administered 2017-06-01 – 2017-06-03 (×3): 100 mg via ORAL
  Filled 2017-05-31 (×4): qty 1

## 2017-05-31 MED ORDER — CEFAZOLIN SODIUM-DEXTROSE 2-4 GM/100ML-% IV SOLN
2.0000 g | Freq: Four times a day (QID) | INTRAVENOUS | Status: AC
  Administered 2017-05-31: 2 g via INTRAVENOUS
  Filled 2017-05-31 (×2): qty 100

## 2017-05-31 MED ORDER — PROPOFOL 10 MG/ML IV BOLUS
INTRAVENOUS | Status: AC
  Filled 2017-05-31: qty 20

## 2017-05-31 MED ORDER — ROCURONIUM BROMIDE 10 MG/ML (PF) SYRINGE
PREFILLED_SYRINGE | INTRAVENOUS | Status: DC | PRN
  Administered 2017-05-31: 40 mg via INTRAVENOUS

## 2017-05-31 MED ORDER — FENTANYL CITRATE (PF) 250 MCG/5ML IJ SOLN
INTRAMUSCULAR | Status: DC | PRN
  Administered 2017-05-31 (×3): 50 ug via INTRAVENOUS

## 2017-05-31 MED ORDER — DEXAMETHASONE SODIUM PHOSPHATE 10 MG/ML IJ SOLN
INTRAMUSCULAR | Status: DC | PRN
  Administered 2017-05-31: 10 mg via INTRAVENOUS

## 2017-05-31 MED ORDER — ACETAMINOPHEN 500 MG PO TABS
1000.0000 mg | ORAL_TABLET | Freq: Three times a day (TID) | ORAL | Status: AC
  Administered 2017-05-31: 1000 mg via ORAL
  Filled 2017-05-31: qty 2

## 2017-05-31 MED ORDER — FENTANYL CITRATE (PF) 100 MCG/2ML IJ SOLN
25.0000 ug | INTRAMUSCULAR | Status: DC | PRN
  Administered 2017-05-31 (×2): 50 ug via INTRAVENOUS

## 2017-05-31 MED ORDER — ENOXAPARIN SODIUM 40 MG/0.4ML ~~LOC~~ SOLN
40.0000 mg | SUBCUTANEOUS | Status: DC
  Administered 2017-06-01 – 2017-06-03 (×3): 40 mg via SUBCUTANEOUS
  Filled 2017-05-31 (×3): qty 0.4

## 2017-05-31 MED ORDER — CEFAZOLIN SODIUM-DEXTROSE 2-4 GM/100ML-% IV SOLN
2.0000 g | INTRAVENOUS | Status: AC
  Administered 2017-05-31 – 2017-06-01 (×2): 2 g via INTRAVENOUS
  Filled 2017-05-31: qty 100

## 2017-05-31 MED ORDER — LIDOCAINE 2% (20 MG/ML) 5 ML SYRINGE
INTRAMUSCULAR | Status: DC | PRN
  Administered 2017-05-31: 60 mg via INTRAVENOUS

## 2017-05-31 MED ORDER — LACTATED RINGERS IV SOLN
INTRAVENOUS | Status: DC
  Administered 2017-05-31 – 2017-06-01 (×3): via INTRAVENOUS

## 2017-05-31 MED ORDER — DEXTROSE 5 % IV SOLN
INTRAVENOUS | Status: DC | PRN
  Administered 2017-05-31: 50 ug/min via INTRAVENOUS

## 2017-05-31 SURGICAL SUPPLY — 44 items
BIT DRILL AO GAMMA 4.2X180 (BIT) ×3 IMPLANT
BNDG COHESIVE 4X5 TAN STRL (GAUZE/BANDAGES/DRESSINGS) ×3 IMPLANT
BNDG GAUZE ELAST 4 BULKY (GAUZE/BANDAGES/DRESSINGS) ×3 IMPLANT
CLOSURE WOUND 1/2 X4 (GAUZE/BANDAGES/DRESSINGS) ×1
COVER MAYO STAND STRL (DRAPES) ×3 IMPLANT
COVER PERINEAL POST (MISCELLANEOUS) ×3 IMPLANT
COVER SURGICAL LIGHT HANDLE (MISCELLANEOUS) ×3 IMPLANT
DRAPE STERI IOBAN 125X83 (DRAPES) ×3 IMPLANT
DRSG MEPILEX BORDER 4X4 (GAUZE/BANDAGES/DRESSINGS) ×6 IMPLANT
DURAPREP 26ML APPLICATOR (WOUND CARE) ×3 IMPLANT
ELECT REM PT RETURN 9FT ADLT (ELECTROSURGICAL) ×3
ELECTRODE REM PT RTRN 9FT ADLT (ELECTROSURGICAL) ×1 IMPLANT
GLOVE BIOGEL PI IND STRL 8 (GLOVE) ×1 IMPLANT
GLOVE BIOGEL PI INDICATOR 8 (GLOVE) ×2
GLOVE ECLIPSE 8.0 STRL XLNG CF (GLOVE) ×6 IMPLANT
GOWN STRL REUS W/ TWL LRG LVL3 (GOWN DISPOSABLE) ×1 IMPLANT
GOWN STRL REUS W/TWL LRG LVL3 (GOWN DISPOSABLE) ×2
GUIDEROD T2 3X1000 (ROD) ×3 IMPLANT
K-WIRE  3.2X450M STR (WIRE) ×4
K-WIRE 3.2X450M STR (WIRE) ×2
KIT ROOM TURNOVER OR (KITS) ×3 IMPLANT
KWIRE 3.2X450M STR (WIRE) ×2 IMPLANT
MANIFOLD NEPTUNE II (INSTRUMENTS) IMPLANT
NAIL GAMMA LG R 5TI 10X360X125 (Nail) ×3 IMPLANT
NS IRRIG 1000ML POUR BTL (IV SOLUTION) ×3 IMPLANT
PACK GENERAL/GYN (CUSTOM PROCEDURE TRAY) ×3 IMPLANT
PAD ARMBOARD 7.5X6 YLW CONV (MISCELLANEOUS) ×9 IMPLANT
PAD CAST 4YDX4 CTTN HI CHSV (CAST SUPPLIES) ×1 IMPLANT
PADDING CAST COTTON 4X4 STRL (CAST SUPPLIES) ×2
REAMER SHAFT BIXCUT (INSTRUMENTS) ×3 IMPLANT
SCREW LAG GAMMA 3 TI 10.5X90MM (Screw) ×3 IMPLANT
SCREW LOCKING T2 F/T  5X37.5MM (Screw) ×2 IMPLANT
SCREW LOCKING T2 F/T 5X37.5MM (Screw) ×1 IMPLANT
STRIP CLOSURE SKIN 1/2X4 (GAUZE/BANDAGES/DRESSINGS) ×2 IMPLANT
SUT MNCRL AB 4-0 PS2 18 (SUTURE) ×3 IMPLANT
SUT MON AB 2-0 CT1 27 (SUTURE) IMPLANT
SUT VIC AB 0 CT1 27 (SUTURE) ×2
SUT VIC AB 0 CT1 27XBRD ANBCTR (SUTURE) ×1 IMPLANT
SUT VIC AB 2-0 CT1 27 (SUTURE) ×2
SUT VIC AB 2-0 CT1 TAPERPNT 27 (SUTURE) ×1 IMPLANT
SUT VIC AB 3-0 CT1 27 (SUTURE)
SUT VIC AB 3-0 CT1 TAPERPNT 27 (SUTURE) IMPLANT
TOWEL OR 17X24 6PK STRL BLUE (TOWEL DISPOSABLE) ×3 IMPLANT
TOWEL OR 17X26 10 PK STRL BLUE (TOWEL DISPOSABLE) ×3 IMPLANT

## 2017-05-31 NOTE — Op Note (Signed)
Orthopaedic Surgery Operative Note (CSN: [id number])  Kimberly Ramirez  1927/10/27 Date of Surgery: 05/30/2017 - 05/31/2017   Diagnoses:  Right intertrochanteric femur fracture  Procedure: Right hip cephallomedullary nail - 28413    Operative Finding Successful completion of planned procedure.  Great fixation and reduction noted.    Post-operative plan: The patient will be WBAT.  The patient will be admitted to floor for likely dispo to snf.  DVT prophylaxis lovenox 40mg  qd.  Pain control with PRN pain medication preferring oral medicines.  Follow up plan will be scheduled in approximately 10-14 days for wound check and XR.  Post-Op Diagnosis: Same Surgeons:Primary: Hiram Gash, MD Assistants:Brandon Leslee Home [facility] Location: [facility] OR ROOM 06 Anesthesia: General Antibiotics: Ancef 2g preop Tourniquet time: * No tourniquets in log * Estimated Blood Loss: 244 Complications: None Specimens: None Implants: Implant Name Type Inv. Item Serial No. Manufacturer Lot No. LRB No. Used Action  NAIL GAMMA LG R 5TI [id number] - [id number] Nail NAIL GAMMA LG R 5TI [id number]  [employer] [id number] Right 1 Implanted  SCREW LAG GAMMA 3 TI 10.5X90MM - [id number] Screw SCREW LAG GAMMA 3 TI 10.5X90MM  [employer] [id number] Right 1 Implanted  SCREW LOCKING T2 F/T  5X37.5MM - [id number] Screw SCREW LOCKING T2 F/T  5X37.5MM  [employer] [id number] Right 1 Implanted    Indications for Surgery:   Kimberly Ramirez is a 84 y.o. female with fall resulting in above injury.  Benefits and risks of operative and nonoperative management were discussed prior to surgery with patient/guardian(s) and informed consent form was completed.  Specific risks including infection, need for additional surgery, hardware pain, periprosthetic fracture, leg rotational deficiency and limp.   Procedure:   The patient was identified in the preoperative holding area where the surgical site was marked. The patient was taken to the OR where a  procedural timeout was called and the above noted anesthesia was induced.  The patient was positioned supine on fracture table.  Preoperative antibiotics were dosed.  The patient's right hip was prepped and draped in the usual sterile fashion.  A second preoperative timeout was called.      The patient was placed supine on a fracture table and appropriate reduction was obtained and visualized on fluoroscopy prior to the beginning of the procedure.  We made an incision proximal to the greater trochanter and dissected down through the fascia.  We then carefully placed our starting awl with cutting edges to 42mm localizing under fluoroscopy prior to advancing the awl into the bone and sliding a ball-tipped guidewire through the awl into the femoral canal.  The wire was passed to an appropriate level at the fascial scar of the distal femur and measurement was obtained proximally using fluoroscopy.  We selected a length of nail noted above.  Entry reamer was not needed as the awl had a built in opening reamer built in.  At this point we placed our nail localizing under fluoroscopy that it was at the appropriate level prior to using the outrigger device to pass a wire and then the cephalo-medullary screw.  We had to use a blunt anterior hip hook to hold the neck fragment in place to the shaft while placing the wire and then screw.  A second wire was used to hold our rotation as well. The screw was locked proximally to avoid over collapse.  We took final shots at the proximal femur and then used perfect circle technique to place one distal interlock screw.  Final  pictures were obtained.  The wounds were thoroughly irrigated closed in a multilayer fashion with absorable sutures.  A sterile dressing was placed.  The patient was awoken from general anesthesia and taken to the PACU in stable condition without complication.     Joya Gaskins, OPA-C, present and scrubbed throughout the case, critical for completion in a  timely fashion, and for retraction, instrumentation, closure.

## 2017-05-31 NOTE — Interval H&P Note (Signed)
Discussed case, risks and benefits with patient again.  All questions answered, no change to history.  Allexa Acoff MD  

## 2017-05-31 NOTE — Progress Notes (Signed)
PROGRESS NOTE    Kimberly Ramirez  KZS:[id number] DOB: [birth year] DOA: 05/30/2017 PCP: Lajean Manes, MD  Brief Narrative: 84 y.o. female with medical history significant of recent TIA     Presented with mechanical fall earlier today resulting in shortened right leg unable to ambulate Patient reportedly was trying to sit down on the chair tripped on the rug but missed it and fell backwards on the floor    Reports some recent dysuria, and constipation No syncope, reports slight  head injury Of note patient was admitted for TIA 1 year ago at that time echogram showed preserved EF she was started on full dose aspirin 325 but she have self discontinued  Denies chest pain. No shortness of breath. She is able to walk up a flight of stairs or a block.  Reports she stopped taking all of her medications.   While in [facility] been seen by orthopedics in ER plant to operate tomorrow afternoon  Significant initial  Findings: WBC 12.9 Hg 11.1 plt 190 INR 1.01  R hip comminuted intertrochanteric right femoral neck fracture with varus angulation  CT head non-acute CT neck non-acute CXR non -acute  ER provider discussed case with:  Orthopedics Ophelia Charter  With Raliegh Ip Who recommends: medical admission We'll see patient in consult in the ER     Assessment & Plan:   Active Problems:   Closed right hip fracture, initial encounter ([place])   Leukocytosis   Hip fracture (HCC)   Dysuria   1] closed right hip fracture patient to go to OR today.  Or tho following.  Continue pain control.  Add bowel regime.  DVT prophylaxis: SCD Code Status full code Family Communication: No family available Disposition Plan:  TBD Consultants: Bobette Mo  Procedures: None Antimicrobials none  Subjective: Complains of pain when moving.   Objective: Vitals:   05/31/17 1124 05/31/17 1139 05/31/17 1145 05/31/17 1149  BP: (!) 170/61 (!) 165/60    Pulse: 92 86    Resp: (!) 9 19 (P) 12   Temp:       TempSrc:      SpO2: 98% 100%  (P) 100%  Weight:      Height:        Intake/Output Summary (Last 24 hours) at 05/31/2017 1150 Last data filed at 05/31/2017 1140 Gross per 24 hour  Intake 1077.5 ml  Output 200 ml  Net 877.5 ml   Filed Weights   05/30/17 1639 05/30/17 2241  Weight: 53.5 kg (118 lb) 49.5 kg (109 lb 2 oz)    Examination:  General exam: Appears calm and comfortable  Respiratory system: Clear to auscultation. Respiratory effort normal. Cardiovascular system: S1 & S2 heard, RRR. No JVD, murmurs, rubs, gallops or clicks. No pedal edema. Gastrointestinal system: Abdomen is nondistended, soft and nontender. No organomegaly or masses felt. Normal bowel sounds heard. Central nervous system: Alert and oriented. No focal neurological deficits. Extremities: Symmetric 5 x 5 power. Skin: No rashes, lesions or ulcers Psychiatry: Judgement and insight appear normal. Mood & affect appropriate.     Data Reviewed: I have personally reviewed following labs and imaging studies  CBC: Recent Labs  Lab 05/30/17 1837 05/31/17 0613  WBC 12.9* 8.8  NEUTROABS 11.3*  --   HGB 11.1* 10.2*  HCT 34.7* 31.6*  MCV 96.4 96.0  PLT 190 542   Basic Metabolic Panel: Recent Labs  Lab 05/30/17 1837 05/31/17 0613  NA 136 135  K 4.2 4.0  CL 102 102  CO2 21*  24  GLUCOSE 136* 114*  BUN 15 13  CREATININE 0.81 0.71  CALCIUM 8.9 8.6*   GFR: Estimated Creatinine Clearance: 38.7 mL/min (by C-G formula based on SCr of 0.71 mg/dL). Liver Function Tests: Recent Labs  Lab 05/30/17 1837 05/31/17 0613  AST 46*  --   ALT 12*  --   ALKPHOS 70  --   BILITOT 0.6  --   PROT 6.7  --   ALBUMIN 3.6 3.2*   No results for input(s): LIPASE, AMYLASE in the last 168 hours. No results for input(s): AMMONIA in the last 168 hours. Coagulation Profile: Recent Labs  Lab 05/30/17 1837  INR 1.01   Cardiac Enzymes: No results for input(s): CKTOTAL, CKMB, CKMBINDEX, TROPONINI in the last 168  hours. BNP (last 3 results) No results for input(s): PROBNP in the last 8760 hours. HbA1C: No results for input(s): HGBA1C in the last 72 hours. CBG: No results for input(s): GLUCAP in the last 168 hours. Lipid Profile: No results for input(s): CHOL, HDL, LDLCALC, TRIG, CHOLHDL, LDLDIRECT in the last 72 hours. Thyroid Function Tests: No results for input(s): TSH, T4TOTAL, FREET4, T3FREE, THYROIDAB in the last 72 hours. Anemia Panel: No results for input(s): VITAMINB12, FOLATE, FERRITIN, TIBC, IRON, RETICCTPCT in the last 72 hours. Sepsis Labs: No results for input(s): PROCALCITON, LATICACIDVEN in the last 168 hours.  Recent Results (from the past 240 hour(s))  Surgical pcr screen     Status: None   Collection Time: 05/30/17 11:38 PM  Result Value Ref Range Status   MRSA, PCR NEGATIVE NEGATIVE Final   Staphylococcus aureus NEGATIVE NEGATIVE Final    Comment: (NOTE) The Xpert SA Assay (FDA approved for NASAL specimens in patients 28 years of age and older), is one component of a comprehensive surveillance program. It is not intended to diagnose infection nor to guide or monitor treatment. Performed at [facility], [place] [address] [place], [place] [address]          Radiology Studies: Dg Chest 1 View  Result Date: 05/30/2017 CLINICAL DATA:  Fall, hip pain EXAM: CHEST 1 VIEW COMPARISON:  None. FINDINGS: Heart size and mediastinal contours are within normal limits. Coarse lung markings bilaterally suggests underlying chronic interstitial lung disease. No confluent opacity to suggest pneumonia. No pleural effusion or pneumothorax seen. No acute or suspicious osseous finding. IMPRESSION: No active disease. No evidence of pneumonia or pulmonary edema. Suspected chronic interstitial lung disease. Electronically Signed   By: Franki Cabot M.D.   On: 05/30/2017 18:02   Ct Head Wo Contrast  Result Date: 05/30/2017 CLINICAL DATA:  85 y/o  F; fall with head injury. EXAM: CT HEAD  WITHOUT CONTRAST CT CERVICAL SPINE WITHOUT CONTRAST TECHNIQUE: Multidetector CT imaging of the head and cervical spine was performed following the standard protocol without intravenous contrast. Multiplanar CT image reconstructions of the cervical spine were also generated. COMPARISON:  05/16/2016 CT head 05/17/2016 MRI head. FINDINGS: CT HEAD FINDINGS Brain: No evidence of acute infarction, hemorrhage, hydrocephalus, extra-axial collection or mass lesion/mass effect. Stable moderate chronic microvascular ischemic changes and parenchymal volume loss of the brain given differences in technique. Small chronic lacunar infarction within the left thalamus. Vascular: Calcific atherosclerosis of carotid siphons. No hyperdense vessel identified. Skull: Right parietal region small scalp contusion. No calvarial fracture. Sinuses/Orbits: Left maxillary sinus extensive mucosal thickening in chronic inflammatory changes of the sinus walls. Bilateral intra-ocular lens replacement. Otherwise negative. Other: None. CT CERVICAL SPINE FINDINGS Alignment: Normal cervical lordosis without listhesis. Leftward curvature  at cervicothoracic junction. Skull base and vertebrae: No acute fracture. No primary bone lesion or focal pathologic process. Soft tissues and spinal canal: No prevertebral fluid or swelling. No visible canal hematoma. Disc levels: Multilevel disc and facet degenerative changes greatest at the C4-5 and C5-6 levels. Uncovertebral and facet hypertrophy results in moderate bilateral bony foraminal stenosis at the C5-6 level and mild left-sided stenosis at the C4-5 level. 5 mm central C4-5 disc protrusion mild-to-moderate canal stenosis and anterior cord impingement. Upper chest: Biapical pleuroparenchymal scarring. Other: Negative. IMPRESSION: CT head: 1. No acute intracranial abnormality or calvarial fracture identified. 2. Right parietal region small scalp contusion. 3. Stable moderate chronic microvascular ischemic  changes and parenchymal volume loss of the brain given differences in technique. 4. Extensive left chronic maxillary sinus disease. CT cervical spine: 1. No acute fracture or dislocation identified. 2. Cervical spondylosis greatest at C4-5 and C5-6 levels. 3. C4-5 central disc protrusion with mild-to-moderate canal stenosis and anterior cord impingement. Electronically Signed   By: Kristine Garbe M.D.   On: 05/30/2017 18:50   Ct Cervical Spine Wo Contrast  Result Date: 05/30/2017 CLINICAL DATA:  84 y/o  F; fall with head injury. EXAM: CT HEAD WITHOUT CONTRAST CT CERVICAL SPINE WITHOUT CONTRAST TECHNIQUE: Multidetector CT imaging of the head and cervical spine was performed following the standard protocol without intravenous contrast. Multiplanar CT image reconstructions of the cervical spine were also generated. COMPARISON:  05/16/2016 CT head 05/17/2016 MRI head. FINDINGS: CT HEAD FINDINGS Brain: No evidence of acute infarction, hemorrhage, hydrocephalus, extra-axial collection or mass lesion/mass effect. Stable moderate chronic microvascular ischemic changes and parenchymal volume loss of the brain given differences in technique. Small chronic lacunar infarction within the left thalamus. Vascular: Calcific atherosclerosis of carotid siphons. No hyperdense vessel identified. Skull: Right parietal region small scalp contusion. No calvarial fracture. Sinuses/Orbits: Left maxillary sinus extensive mucosal thickening in chronic inflammatory changes of the sinus walls. Bilateral intra-ocular lens replacement. Otherwise negative. Other: None. CT CERVICAL SPINE FINDINGS Alignment: Normal cervical lordosis without listhesis. Leftward curvature at cervicothoracic junction. Skull base and vertebrae: No acute fracture. No primary bone lesion or focal pathologic process. Soft tissues and spinal canal: No prevertebral fluid or swelling. No visible canal hematoma. Disc levels: Multilevel disc and facet degenerative  changes greatest at the C4-5 and C5-6 levels. Uncovertebral and facet hypertrophy results in moderate bilateral bony foraminal stenosis at the C5-6 level and mild left-sided stenosis at the C4-5 level. 5 mm central C4-5 disc protrusion mild-to-moderate canal stenosis and anterior cord impingement. Upper chest: Biapical pleuroparenchymal scarring. Other: Negative. IMPRESSION: CT head: 1. No acute intracranial abnormality or calvarial fracture identified. 2. Right parietal region small scalp contusion. 3. Stable moderate chronic microvascular ischemic changes and parenchymal volume loss of the brain given differences in technique. 4. Extensive left chronic maxillary sinus disease. CT cervical spine: 1. No acute fracture or dislocation identified. 2. Cervical spondylosis greatest at C4-5 and C5-6 levels. 3. C4-5 central disc protrusion with mild-to-moderate canal stenosis and anterior cord impingement. Electronically Signed   By: Kristine Garbe M.D.   On: 05/30/2017 18:50   Dg Hip Unilat W Or Wo Pelvis 1 View Right  Result Date: 05/30/2017 CLINICAL DATA:  Fall.  Right hip pain. EXAM: DG HIP (WITH OR WITHOUT PELVIS) 1V RIGHT COMPARISON:  None. FINDINGS: Frontal pelvis shows diffuse demineralization. AP and frog-leg lateral views of the right hip reveal comminuted intertrochanteric right femoral neck fracture with varus angulation. SI joints and symphysis pubis unremarkable. IMPRESSION: Comminuted intertrochanteric  right femoral neck fracture with varus angulation. Electronically Signed   By: Misty Stanley M.D.   On: 05/30/2017 17:57   Dg Femur 1 View Right  Result Date: 05/30/2017 CLINICAL DATA:  Fall, right hip pain EXAM: RIGHT FEMUR 1 VIEW COMPARISON:  None. FINDINGS: Displaced/comminuted fracture of the right femoral neck, intertrochanteric. Right femoral head remains well positioned relative to the acetabulum. Distal femur appears intact and normally aligned. IMPRESSION: Displaced/comminuted  fracture of the right femoral neck, intertrochanteric, with nearly 90 degrees angulation at the fracture site. Electronically Signed   By: Franki Cabot M.D.   On: 05/30/2017 18:01   Dg Femur Port, Min 2 Views Right  Result Date: 05/31/2017 CLINICAL DATA:  Postop right femoral intramedullary nail. EXAM: RIGHT FEMUR PORTABLE 2 VIEW COMPARISON:  Earlier same day as well as 05/30/2017 FINDINGS: Examination demonstrates evidence of a right femoral intramedullary nail with associated screw bridging patient's trochanteric fracture into the femoral head intact and normally located. There is anatomic alignment about the fracture site. Minimal degenerate change of the right hip. IMPRESSION: Hardware intact and normally located post placement of right femoral intramedullary nail with associated screw bridging the intertrochanteric fracture into the femoral head. Anatomic alignment about the fracture site. Electronically Signed   By: Marin Olp M.D.   On: 05/31/2017 11:18        Scheduled Meds: . celecoxib  100 mg Oral Daily   Continuous Infusions: . lactated ringers 10 mL/hr at 05/31/17 0909  . [MAR Hold] methocarbamol (ROBAXIN)  IV       LOS: 1 day      Georgette Shell, MD [facility] If 7PM-7AM, please contact night-coverage [email or web address] Password [facility] 05/31/2017, 11:50 AM

## 2017-05-31 NOTE — Transfer of Care (Signed)
Immediate Anesthesia Transfer of Care Note  Patient: Kimberly Ramirez  Procedure(s) Performed: INTRAMEDULLARY (IM) NAIL INTERTROCHANTRIC (Right Hip)  Patient Location: PACU  Anesthesia Type:General  Level of Consciousness: awake, alert  and oriented  Airway & Oxygen Therapy: Patient Spontanous Breathing and Patient connected to nasal cannula oxygen  Post-op Assessment: Report given to RN and Post -op Vital signs reviewed and stable  Post vital signs: Reviewed and stable  Last Vitals:  Vitals:   05/31/17 0500 05/31/17 1054  BP: (!) 158/60 139/62  Pulse: 80 76  Resp: 19 (!) 6  Temp: 36.7 C   SpO2: 97% 100%    Last Pain:  Vitals:   05/31/17 0905  TempSrc:   PainSc: 0-No pain      Patients Stated Pain Goal: 3 (01/15/48 6116)  Complications: No apparent anesthesia complications

## 2017-05-31 NOTE — Progress Notes (Signed)
Report received from [place], RN from 5N.

## 2017-05-31 NOTE — Progress Notes (Signed)
Patient treated by Dr. Doyle Askew on 05/17/17 for UTI with Levaquin.  Patient assessed to have possible vaginal yeast infection.  No perivaginal irritation noted, no redness, however patient states she feels "different down there", asking "are there streaks of white in my vagina?"

## 2017-05-31 NOTE — Anesthesia Preprocedure Evaluation (Addendum)
Anesthesia Evaluation  Patient identified by MRN, date of birth, ID band Patient awake    Reviewed: Allergy & Precautions, H&P , NPO status , Patient's Chart, lab work & pertinent test results  Airway Mallampati: III  TM Distance: >3 FB Neck ROM: Full    Dental no notable dental hx. (+) Teeth Intact, Dental Advisory Given   Pulmonary neg pulmonary ROS,    Pulmonary exam normal breath sounds clear to auscultation       Cardiovascular negative cardio ROS   Rhythm:Regular Rate:Normal     Neuro/Psych negative neurological ROS  negative psych ROS   GI/Hepatic negative GI ROS, Neg liver ROS,   Endo/Other  negative endocrine ROS  Renal/GU negative Renal ROS  negative genitourinary   Musculoskeletal   Abdominal   Peds  Hematology negative hematology ROS (+)   Anesthesia Other Findings   Reproductive/Obstetrics negative OB ROS                            Anesthesia Physical Anesthesia Plan  ASA: II  Anesthesia Plan: General   Post-op Pain Management:    Induction: Intravenous  PONV Risk Score and Plan: 4 or greater and Ondansetron, Dexamethasone and Treatment may vary due to age or medical condition  Airway Management Planned: Oral ETT  Additional Equipment:   Intra-op Plan:   Post-operative Plan: Extubation in OR  Informed Consent: I have reviewed the patients History and Physical, chart, labs and discussed the procedure including the risks, benefits and alternatives for the proposed anesthesia with the patient or authorized representative who has indicated his/her understanding and acceptance.   Dental advisory given  Plan Discussed with: CRNA  Anesthesia Plan Comments:         Anesthesia Quick Evaluation

## 2017-05-31 NOTE — Anesthesia Postprocedure Evaluation (Signed)
Anesthesia Post Note  Patient: Abbagale Goguen  Procedure(s) Performed: INTRAMEDULLARY (IM) NAIL INTERTROCHANTRIC (Right Hip)     Patient location during evaluation: PACU Anesthesia Type: General Level of consciousness: awake and alert Pain management: pain level controlled Vital Signs Assessment: post-procedure vital signs reviewed and stable Respiratory status: spontaneous breathing, nonlabored ventilation and respiratory function stable Cardiovascular status: blood pressure returned to baseline and stable Postop Assessment: no apparent nausea or vomiting Anesthetic complications: no    Last Vitals:  Vitals:   05/31/17 1145 05/31/17 1149  BP:  (!) 154/70  Pulse:  85  Resp: 12 12  Temp:  (!) 36.3 C  SpO2:  100%    Last Pain:  Vitals:   05/31/17 1109  TempSrc:   PainSc: 6                  Jya Hughston,W. EDMOND

## 2017-05-31 NOTE — Anesthesia Procedure Notes (Signed)
Procedure Name: Intubation Date/Time: 05/31/2017 9:23 AM Performed by: Clearnce Sorrel, CRNA Pre-anesthesia Checklist: Patient identified, Emergency Drugs available, Suction available, Patient being monitored and Timeout performed Patient Re-evaluated:Patient Re-evaluated prior to induction Oxygen Delivery Method: Circle system utilized Preoxygenation: Pre-oxygenation with 100% oxygen Induction Type: IV induction Ventilation: Mask ventilation without difficulty Laryngoscope Size: Mac and 3 Grade View: Grade I Tube type: Oral Tube size: 7.0 mm Number of attempts: 1 Airway Equipment and Method: Stylet Placement Confirmation: ETT inserted through vocal cords under direct vision,  positive ETCO2 and breath sounds checked- equal and bilateral Secured at: 22 cm Tube secured with: Tape Dental Injury: Teeth and Oropharynx as per pre-operative assessment

## 2017-05-31 NOTE — Progress Notes (Signed)
Xrays complete

## 2017-06-01 LAB — CBC WITH DIFFERENTIAL/PLATELET
BASOS ABS: 0 10*3/uL (ref 0.0–0.1)
BASOS PCT: 0 %
EOS PCT: 0 %
Eosinophils Absolute: 0 10*3/uL (ref 0.0–0.7)
HCT: 29.4 % — ABNORMAL LOW (ref 36.0–46.0)
Hemoglobin: 9.5 g/dL — ABNORMAL LOW (ref 12.0–15.0)
Lymphocytes Relative: 4 %
Lymphs Abs: 0.6 10*3/uL — ABNORMAL LOW (ref 0.7–4.0)
MCH: 30.5 pg (ref 26.0–34.0)
MCHC: 32.3 g/dL (ref 30.0–36.0)
MCV: 94.5 fL (ref 78.0–100.0)
MONO ABS: 1.2 10*3/uL — AB (ref 0.1–1.0)
Monocytes Relative: 8 %
Neutro Abs: 13.2 10*3/uL — ABNORMAL HIGH (ref 1.7–7.7)
Neutrophils Relative %: 88 %
PLATELETS: 184 10*3/uL (ref 150–400)
RBC: 3.11 MIL/uL — ABNORMAL LOW (ref 3.87–5.11)
RDW: 12 % (ref 11.5–15.5)
WBC: 15 10*3/uL — ABNORMAL HIGH (ref 4.0–10.5)

## 2017-06-01 LAB — URINALYSIS, ROUTINE W REFLEX MICROSCOPIC
BILIRUBIN URINE: NEGATIVE
Glucose, UA: NEGATIVE mg/dL
HGB URINE DIPSTICK: NEGATIVE
Ketones, ur: NEGATIVE mg/dL
Leukocytes, UA: NEGATIVE
Nitrite: NEGATIVE
PROTEIN: NEGATIVE mg/dL
Specific Gravity, Urine: 1.013 (ref 1.005–1.030)
pH: 5 (ref 5.0–8.0)

## 2017-06-01 LAB — BASIC METABOLIC PANEL
Anion gap: 11 (ref 5–15)
BUN: 14 mg/dL (ref 6–20)
CALCIUM: 9.2 mg/dL (ref 8.9–10.3)
CO2: 26 mmol/L (ref 22–32)
Chloride: 101 mmol/L (ref 101–111)
Creatinine, Ser: 0.87 mg/dL (ref 0.44–1.00)
GFR calc Af Amer: >60 mL/min (ref >60)
GFR, EST NON AFRICAN AMERICAN: 58 mL/min — AB (ref >60)
GLUCOSE: 136 mg/dL — AB (ref 65–99)
Potassium: 4.1 mmol/L (ref 3.5–5.1)
Sodium: 138 mmol/L (ref 135–145)

## 2017-06-01 LAB — VITAMIN D 25 HYDROXY (VIT D DEFICIENCY, FRACTURES): Vit D, 25-Hydroxy: 16.8 ng/mL — ABNORMAL LOW (ref 30.0–100.0)

## 2017-06-01 MED ORDER — ACETAMINOPHEN 500 MG PO TABS: 1000.0000 mg | ORAL_TABLET | Freq: Three times a day (TID) | ORAL | 0 refills | Status: AC

## 2017-06-01 MED ORDER — HYDRALAZINE HCL 20 MG/ML IJ SOLN: 5.0000 mg | Freq: Four times a day (QID) | INTRAMUSCULAR | Status: DC | PRN

## 2017-06-01 MED ORDER — ENOXAPARIN SODIUM 40 MG/0.4ML ~~LOC~~ SOLN: 40.0000 mg | SUBCUTANEOUS | 1 refills | Status: DC

## 2017-06-01 MED ORDER — OXYCODONE HCL 5 MG PO TABS: ORAL_TABLET | ORAL | 0 refills | Status: DC

## 2017-06-01 NOTE — Evaluation (Signed)
Occupational Therapy Evaluation Patient Details Name: Kimberly Ramirez MRN: [id number] DOB: 1928/04/11 Today's Date: 06/01/2017    History of Present Illness 84 yo female with onset of mechanical fall resulting in a R hip fracture with IM nailing 2/17, after comminuted intertrochanteric fem neck fracture.  PMHx:  cervical spondylosis, C4-5 central disc protrusion, cervical stenosis, I gait with no AD   Clinical Impression   Patient is s/p R im nailing hip surgery resulting in functional limitations due to the deficits listed below (see OT problem list). Pt currently requires (A) for LB adls and bathroom transfers with high fall risk. OF note patient could greatly benefit from the d/c of purewick due to confusion on when to use it and when it is in place. Pt will be better served to continue normal routine of toilet transfer. Also L foot is noted to have bruising of the 1st 2nd and 3rd toes. Pt with difficulty weight shifting and now question if bruising on L foot could be an additional factor.  Patient will benefit from skilled OT acutely to increase independence and safety with ADLS to allow discharge SNF.     Follow Up Recommendations  SNF    Equipment Recommendations  Other (comment)(defer to SNF)    Recommendations for Other Services       Precautions / Restrictions Precautions Precautions: Fall(ck O2 sats) Precaution Comments: L foot noted to have very severe bruising 1st 2nd and 3 toe Restrictions Weight Bearing Restrictions: Yes RLE Weight Bearing: Weight bearing as tolerated      Mobility Bed Mobility Overal bed mobility: Needs Assistance Bed Mobility: Sit to Supine     Supine to sit: Mod assist Sit to supine: Mod assist   General bed mobility comments: (A) to elevate both legs intot he bed and the bed tilted so pt could use bil UE to pull up into the hob  Transfers Overall transfer level: Needs assistance Equipment used: Rolling walker (2 wheeled);1 person hand held  assist Transfers: Sit to/from Stand Sit to Stand: Max assist         General transfer comment: pt requires cues for hand placement and direction on sequence to push up into standing. pt with posterior lean on standing    Balance Overall balance assessment: History of Falls                                         ADL either performed or assessed with clinical judgement   ADL Overall ADL's : Needs assistance/impaired Eating/Feeding: Set up   Grooming: Set up   Upper Body Bathing: Set up   Lower Body Bathing: Moderate assistance Lower Body Bathing Details (indicate cue type and reason): pt able to reach knees and above. pt able to static stand for peri care this session Upper Body Dressing : Minimal assistance   Lower Body Dressing: Moderate assistance   Toilet Transfer: Moderate assistance Toilet Transfer Details (indicate cue type and reason): cues for hand placement         Functional mobility during ADLs: Moderate assistance;Rolling walker General ADL Comments: pt incontinent on standing. pt voiding twice on the commode. pt incontinent and states "i thought they had that thing that catches it there" Pt coudl benefit greatly from the D/c of the purewick     Vision Baseline Vision/History: Wears glasses       Perception     Praxis  Pertinent Vitals/Pain Pain Assessment: Faces Faces Pain Scale: Hurts even more Pain Location: R hip with movement Pain Descriptors / Indicators: Operative site guarding;Tender Pain Intervention(s): Repositioned;Premedicated before session;Monitored during session     Hand Dominance Right   Extremity/Trunk Assessment Upper Extremity Assessment Upper Extremity Assessment: Overall WFL for tasks assessed   Lower Extremity Assessment Lower Extremity Assessment: Defer to PT evaluation RLE Deficits / Details: IM nailing with pain and edema RLE Coordination: decreased fine motor;decreased gross motor   Cervical  / Trunk Assessment Cervical / Trunk Assessment: Kyphotic   Communication Communication Communication: Other (comment)(has cognitive changes post op that are new)   Cognition Arousal/Alertness: Awake/alert Behavior During Therapy: Flat affect Overall Cognitive Status: Impaired/Different from baseline Area of Impairment: Safety/judgement;Awareness                   Current Attention Level: Selective Memory: Decreased short-term memory;Decreased recall of precautions Following Commands: Follows one step commands with increased time Safety/Judgement: Decreased awareness of safety;Decreased awareness of deficits Awareness: Intellectual Problem Solving: Slow processing General Comments: pt aware of location reason for admission and pt able to recall information provided early in the session at the end. pt does want to speak of her parents alot this session who have both passed and becomes liable at one point.   General Comments  dressing dry and clean    Exercises Exercises: (strength R hip was 2- due to pain )   Shoulder Instructions      Home Living Family/patient expects to be discharged to:: Skilled nursing facility                                        Prior Functioning/Environment Level of Independence: Independent        Comments: has RW at home        OT Problem List: Decreased activity tolerance;Decreased strength;Decreased range of motion;Impaired balance (sitting and/or standing);Decreased cognition;Decreased safety awareness;Decreased knowledge of use of DME or AE;Decreased knowledge of precautions;Pain;Increased edema      OT Treatment/Interventions: Self-care/ADL training;Therapeutic exercise;DME and/or AE instruction;Therapeutic activities;Cognitive remediation/compensation;Patient/family education;Balance training    OT Goals(Current goals can be found in the care plan section) Acute Rehab OT Goals Patient Stated Goal: to get back home  to read ( being there at rehab will probably keep me from my reading) OT Goal Formulation: With patient Time For Goal Achievement: 06/15/17 Potential to Achieve Goals: Good  OT Frequency: Min 2X/week   Barriers to D/C: Decreased caregiver support          Co-evaluation              AM-PAC PT "6 Clicks" Daily Activity     Outcome Measure Help from another person eating meals?: None Help from another person taking care of personal grooming?: A Little Help from another person toileting, which includes using toliet, bedpan, or urinal?: A Little Help from another person bathing (including washing, rinsing, drying)?: A Little Help from another person to put on and taking off regular upper body clothing?: A Little Help from another person to put on and taking off regular lower body clothing?: A Little 6 Click Score: 19   End of Session Equipment Utilized During Treatment: Gait belt;Rolling walker Nurse Communication: Mobility status;Precautions  Activity Tolerance: Patient tolerated treatment well Patient left: in bed;with call bell/phone within reach;with bed alarm set  OT Visit Diagnosis: Unsteadiness on feet (R26.81);Pain  Pain - Right/Left: Right Pain - part of body: Leg                Time: 1334-1406 OT Time Calculation (min): 32 min Charges:  OT General Charges $OT Visit: 1 Visit OT Evaluation $OT Eval Moderate Complexity: 1 Mod OT Treatments $Self Care/Home Management : 8-22 mins G-Codes:      Jeri Modena   OTR/L Pager: [phone] Office: [phone] .   Parke Poisson B 06/01/2017, 2:38 PM

## 2017-06-01 NOTE — NC FL2 (Signed)
[place] LEVEL OF CARE SCREENING TOOL     IDENTIFICATION  Patient Name: Kimberly Ramirez Birthdate: 07/23/1927 Sex: female Admission Date (Current Location): 05/30/2017  [facility] and [place] Number:  Herbalist and Address:  [facility], [place] [address] [place], [place] [address]      Provider Number: [id number]  Attending Physician Name and Address:  Georgette Shell, MD  Relative Name and Phone Number:  Jonnie Finner, daughter, [phone]    Current Level of Care: Hospital Recommended Level of Care: [place] Prior Approval Number:    Date Approved/Denied:   PASRR Number: [id number]  Discharge Plan: SNF    Current Diagnoses: Patient Active Problem List   Diagnosis Date Noted  . Closed right hip fracture, initial encounter ([place]) 05/30/2017  . Leukocytosis 05/30/2017  . Hip fracture ([place]) 05/30/2017  . Dysuria 05/30/2017  . TIA (transient ischemic attack) 05/16/2016    Orientation RESPIRATION BLADDER Height & Weight     Self, Time, Situation, Place  Normal Continent Weight: 109 lb 2 oz (49.5 kg) Height:  5' 5.5" (166.4 cm)  BEHAVIORAL SYMPTOMS/MOOD NEUROLOGICAL BOWEL NUTRITION STATUS      Continent Diet(See DC Summary)  AMBULATORY STATUS COMMUNICATION OF NEEDS Skin   Limited Assist Verbally Surgical wounds                       Personal Care Assistance Level of Assistance  Dressing, Bathing, Feeding Bathing Assistance: Limited assistance Feeding assistance: Independent Dressing Assistance: Limited assistance     Functional Limitations Info  Sight, Hearing, Speech Sight Info: Adequate Hearing Info: Adequate Speech Info: Adequate    SPECIAL CARE FACTORS FREQUENCY  OT (By licensed OT), PT (By licensed PT)     PT Frequency: 5x week OT Frequency: 5x week            Contractures      Additional Factors Info  Code Status, Isolation Precautions, Allergies Code Status Info:  Full Allergies Info: No Known Allergies     Isolation Precautions Info: ESBL     Current Medications (06/01/2017):  This is the current hospital active medication list Current Facility-Administered Medications  Medication Dose Route Frequency Provider Last Rate Last Dose  . celecoxib (CELEBREX) capsule 100 mg  100 mg Oral Daily Ophelia Charter T, MD   100 mg at 06/01/17 1001  . enoxaparin (LOVENOX) injection 40 mg  40 mg Subcutaneous Q24H Ophelia Charter T, MD   40 mg at 06/01/17 1000  . hydrALAZINE (APRESOLINE) injection 5 mg  5 mg Intravenous Q6H PRN Georgette Shell, MD      . HYDROcodone-acetaminophen (NORCO/VICODIN) 5-325 MG per tablet 1-2 tablet  1-2 tablet Oral Q6H PRN Toy Baker, MD   2 tablet at 06/01/17 [phone]  . lactated ringers infusion   Intravenous Continuous Roderic Palau, MD 10 mL/hr at 06/01/17 [phone]    . menthol-cetylpyridinium (CEPACOL) lozenge 3 mg  1 lozenge Oral PRN Hiram Gash, MD       Or  . phenol (CHLORASEPTIC) mouth spray 1 spray  1 spray Mouth/Throat PRN Hiram Gash, MD      . methocarbamol (ROBAXIN) tablet 500 mg  500 mg Oral Q6H PRN Toy Baker, MD   500 mg at 05/31/17 1113   Or  . methocarbamol (ROBAXIN) 500 mg in dextrose 5 % 50 mL IVPB  500 mg Intravenous Q6H PRN Toy Baker, MD      . morphine 2  MG/ML injection 0.5 mg  0.5 mg Intravenous Q2H PRN Toy Baker, MD   0.5 mg at 05/31/17 2145  . oxyCODONE (Oxy IR/ROXICODONE) immediate release tablet 5-10 mg  5-10 mg Oral Q4H PRN Hiram Gash, MD   10 mg at 05/31/17 1806  . polyethylene glycol (MIRALAX / GLYCOLAX) packet 17 g  17 g Oral Daily Georgette Shell, MD   17 g at 06/01/17 1000     Discharge Medications: Please see discharge summary for a list of discharge medications.  Relevant Imaging Results:  Relevant Lab Results:   Additional Information SS#: [id number]  Normajean Baxter, LCSW

## 2017-06-01 NOTE — Plan of Care (Signed)
Patient remains disoriented throughout night shift, alert to self.  Orientation to hospital, situation and recognition/understanding of having had hip surgery waxes and wanes.  Attempts often to get out of bed, bed alarm remains on.  Pain relieved from 4-5/10 with PRN pain meds.  Shows little interest in food or drinks offered.  Dressing to R hip intact with no drainage.

## 2017-06-01 NOTE — Evaluation (Signed)
Physical Therapy Evaluation Patient Details Name: Kimberly Ramirez MRN: [id number] DOB: October 13, 1927 Today's Date: 06/01/2017   History of Present Illness  84 yo female with onset of mechanical fall resulting in a R hip fracture with IM nailing 2/17, after comminuted intertrochanteric fem neck fracture.  PMHx:  cervical spondylosis, C4-5 central disc protrusion, cervical stenosis, I gait with no AD  Clinical Impression  Pt is admitted for a fall with recent TIA preceding her fem neck fracture.  Has a dense cognitive presentation with difficulty taking on new skills with RW, required very repetitive cues and was even struggling to sit down to [facility] and then recliner.  Will recommend SNF and may require more LT care placement afterward, which the daughter is discussing with nursing when PT arrived.  Will see for PT to work on standing control and strength to transition to more independence with gait and balance.  SNF recommended to achieve therapy goals.    Follow Up Recommendations SNF    Equipment Recommendations  Rolling walker with 5" wheels    Recommendations for Other Services       Precautions / Restrictions Precautions Precautions: Fall(ck O2 sats) Restrictions Weight Bearing Restrictions: Yes RLE Weight Bearing: Weight bearing as tolerated      Mobility  Bed Mobility Overal bed mobility: Needs Assistance Bed Mobility: Supine to Sit     Supine to sit: Mod assist     General bed mobility comments: used bed pad and assisted trunk as pt does not initiate any mobility  Transfers Overall transfer level: Needs assistance Equipment used: Rolling walker (2 wheeled);1 person hand held assist Transfers: Sit to/from Stand Sit to Stand: Mod assist         General transfer comment: cues for hand placement 100% of the time  Ambulation/Gait Ambulation/Gait assistance: Min assist;Mod assist Ambulation Distance (Feet): 8 Feet(4+4) Assistive device: Rolling walker (2 wheeled);1 person  hand held assist Gait Pattern/deviations: Step-to pattern;Decreased stride length;Decreased weight shift to right;Wide base of support;Trunk flexed;Shuffle Gait velocity: reduced Gait velocity interpretation: Below normal speed for age/gender General Gait Details: very slow to step and dense cues are needed, incontinent of urine despite using BSC  Stairs            Wheelchair Mobility    Modified Rankin (Stroke Patients Only)       Balance Overall balance assessment: History of Falls                                           Pertinent Vitals/Pain Pain Assessment: Faces Faces Pain Scale: Hurts even more Pain Location: R hip with movement Pain Descriptors / Indicators: Operative site guarding;Tender Pain Intervention(s): Limited activity within patient's tolerance;Monitored during session;Premedicated before session;Repositioned;RN gave pain meds during session    Home Living Family/patient expects to be discharged to:: Skilled nursing facility                      Prior Function Level of Independence: Independent         Comments: has RW at home     Hand Dominance   Dominant Hand: Right    Extremity/Trunk Assessment   Upper Extremity Assessment Upper Extremity Assessment: Overall WFL for tasks assessed    Lower Extremity Assessment Lower Extremity Assessment: RLE deficits/detail RLE Deficits / Details: IM nailing with pain and edema RLE Coordination: decreased fine motor;decreased gross motor  Cervical / Trunk Assessment Cervical / Trunk Assessment: Kyphotic  Communication   Communication: Other (comment)(has cognitive changes post op that are new)  Cognition Arousal/Alertness: Awake/alert Behavior During Therapy: Flat affect Overall Cognitive Status: Impaired/Different from baseline Area of Impairment: Attention;Memory;Following commands;Safety/judgement;Awareness;Problem solving                   Current  Attention Level: Selective Memory: Decreased short-term memory;Decreased recall of precautions Following Commands: Follows one step commands with increased time Safety/Judgement: Decreased awareness of safety;Decreased awareness of deficits Awareness: Intellectual Problem Solving: Slow processing;Decreased initiation;Difficulty sequencing;Requires verbal cues;Requires tactile cues General Comments: pt is requiring significant cues to sequence walker for transfers to [facility] and to chair      General Comments General comments (skin integrity, edema, etc.): clean bandage on R hip    Exercises     Assessment/Plan    PT Assessment Patient needs continued PT services  PT Problem List Decreased strength;Decreased range of motion;Decreased activity tolerance;Decreased balance;Decreased mobility;Decreased coordination;Decreased cognition;Decreased knowledge of use of DME;Decreased safety awareness;Decreased skin integrity;Pain       PT Treatment Interventions DME instruction;Gait training;Functional mobility training;Therapeutic activities;Therapeutic exercise;Balance training;Neuromuscular re-education;Patient/family education    PT Goals (Current goals can be found in the Care Plan section)  Acute Rehab PT Goals Patient Stated Goal: none stated PT Goal Formulation: With patient/family Time For Goal Achievement: 06/15/17 Potential to Achieve Goals: Good    Frequency Min 4X/week   Barriers to discharge Decreased caregiver support(home with limited help normally) Lives at home with time alone    Co-evaluation               AM-PAC PT "6 Clicks" Daily Activity  Outcome Measure Difficulty turning over in bed (including adjusting bedclothes, sheets and blankets)?: Unable Difficulty moving from lying on back to sitting on the side of the bed? : Unable Difficulty sitting down on and standing up from a chair with arms (e.g., wheelchair, bedside commode, etc,.)?: Unable Help needed  moving to and from a bed to chair (including a wheelchair)?: A Lot Help needed walking in hospital room?: A Lot Help needed climbing 3-5 steps with a railing? : Total 6 Click Score: 8    End of Session Equipment Utilized During Treatment: Gait belt Activity Tolerance: Patient limited by pain;Patient limited by fatigue Patient left: in chair;with call bell/phone within reach;with chair alarm set;with family/visitor present;with nursing/sitter in room Nurse Communication: Mobility status PT Visit Diagnosis: Unsteadiness on feet (R26.81);Muscle weakness (generalized) (M62.81);History of falling (Z91.81);Difficulty in walking, not elsewhere classified (R26.2);Pain Pain - Right/Left: Right Pain - part of body: Hip    Time: 0932-1013 PT Time Calculation (min) (ACUTE ONLY): 41 min   Charges:   PT Evaluation $PT Eval Moderate Complexity: 1 Mod PT Treatments $Gait Training: 8-22 mins $Therapeutic Activity: 8-22 mins   PT G Codes:   PT G-Codes **NOT FOR INPATIENT CLASS** Functional Assessment Tool Used: AM-PAC 6 Clicks Basic Mobility    Ramond Dial 06/01/2017, 11:35 AM   Mee Hives, PT MS Acute Rehab Dept. Number: [place] and [place]

## 2017-06-01 NOTE — Clinical Social Work Note (Signed)
Clinical Social Work Assessment  Patient Details  Name: Kimberly Ramirez MRN: [id number] Date of Birth: 02-19-1928  Date of referral:  06/01/17               Reason for consult:  Facility Placement                Permission sought to share information with:  Chartered certified accountant granted to share information::  Yes, Verbal Permission Granted  Name::     Electrical engineer::  SNF  Relationship::  daughter  Contact Information:     Housing/Transportation Living arrangements for the past 2 months:  Single Family Home Source of Information:  Patient, Adult Children Patient Interpreter Needed:  None Criminal Activity/Legal Involvement Pertinent to Current Situation/Hospitalization:  No - Comment as needed Significant Relationships:  Adult Children, Other Family Members Lives with:  Self Do you feel safe going back to the place where you live?  No Need for family participation in patient care:  Yes (Comment)  Care giving concerns:  Pt from home alone. She was independent prior to fall. Pt, daughter and son-n-law at bedside and discussed SNF placement and options. CSW obtained permission to send to SNF's in [facility]. Pt has never experienced SNF in the past. Family expressed in interest in [place].  CSW explained the insurance process and transport to SNF.    Social Worker assessment / plan:  CSW will f/u with daughter on SNF offers and assist with disposition.  Employment status:  Retired Forensic scientist:  Medicare PT Recommendations:  [place] / Referral to community resources:  [address]  Patient/Family's Response to care:  Psychologist, prison and probation services of CSW assistance with SNF options and placement. CSW will f/u once SNF offers received.  Patient/Family's Understanding of and Emotional Response to Diagnosis, Current Treatment, and Prognosis:  Patient/family has good  understanding of diagnosis, current treatment and prognosis as they are agreeable to SNF placement at discharge due to impairment and limitations. Pt has not experienced SNf however daughter/son-n-law are active in her care and will provide support. Pt will complete short term rehab and then return home while family work on the long-term plan for patient. CSW ill follow for disposition.   Emotional Assessment Appearance:  Appears stated age Attitude/Demeanor/Rapport:  (Cooperative) Affect (typically observed):  Accepting, Appropriate Orientation:  Oriented to Situation, Oriented to  Time, Oriented to Place, Oriented to Self Alcohol / Substance use:  Not Applicable Psych involvement (Current and /or in the community):  No (Comment)  Discharge Needs  Concerns to be addressed:  Discharge Planning Concerns Readmission within the last 30 days:  No Current discharge risk:  Dependent with Mobility, Physical Impairment, Lives alone Barriers to Discharge:  No Barriers Identified   Normajean Baxter, LCSW 06/01/2017, 1:42 PM

## 2017-06-01 NOTE — Progress Notes (Signed)
Orthopedic Tech Progress Note Patient Details:  Kimberly Ramirez July 01, 1927 [id number]  Ortho Devices Ortho Device/Splint Location: Trapeze bar   Post Interventions Patient Tolerated: Unable to use device properly   [name] 06/01/2017, 8:15 AM

## 2017-06-01 NOTE — Progress Notes (Signed)
ORTHOPAEDIC PROGRESS NOTE  s/p Procedure(s): R INTRAMEDULLARY (IM) NAIL INTERTROCHANTRIC  SUBJECTIVE: Reports mild pain about operative site. No chest pain. No SOB. No nausea/vomiting. No other complaints.  OBJECTIVE: PE: RLE: incision CDI, leg lengths equal, warm well perfused foot, intact EHL/TA/GSC   Vitals:   06/01/17 0030 06/01/17 0624  BP: (!) 152/59 (!) 156/52  Pulse: 97 79  Resp: 18 18  Temp: 97.6 F (36.4 C) 98 F (36.7 C)  SpO2: 100% 100%     ASSESSMENT: Kimberly Ramirez is a 84 y.o. female doing well postoperatively.  PLAN: Weightbearing: WBAT RLE Insicional and dressing care: OK to remove dressings POD3 and leave open to air with dry gauze PRN Orthopedic device(s): None Showering: POD3 VTE prophylaxis: Lovenox 40mg  qd 4 weeks Pain control: prn meds Follow - up plan: 2 weeks Contact information:  Weekdays 8-5 Ophelia Charter MD [phone], After hours and holidays please check [email or web address] for group call information for [facility]

## 2017-06-01 NOTE — Progress Notes (Signed)
PROGRESS NOTE    Kimberly Ramirez  WCB:[id number] DOB: 1927-07-01 DOA: 05/30/2017 PCP: Lajean Manes, MD   Brief Narrative89 y.o.femalewith medical history significant of recent TIA   Presented withmechanical fall earlier today resulting in shortened right leg unable toambulate Patient reportedly was trying to sit downon the chairtripped on the rugbut missed it and fell backwards on the floor   Reports some recent dysuria, and constipation No syncope, reports slight head injury Of note patient was admitted for TIA 1 year ago at that time echogram showed preserved EF she was started on full dose aspirin 325but she have self discontinued  Denies chest pain. No shortness of breath. She is able to walk upa flight ofstairsor a block.  Reports she stopped taking all of her medications.  While in [place]:[facility] been seen by orthopedics in ER plant to operate tomorrow afternoon  Significant initial Findings: WBC 12.9 Hg 11.1 plt 190 INR 1.01 R hipcomminuted intertrochanteric right femoral neck fracture with varus angulation  CT headnon-acute CT neck non-acute CXR non -acute  ER provider discussed case with:Orthopedics Dax Griffin Basil With Harlin Heys recommends:medical admission We'll see patient in consult in theER      Assessment & Plan:   Active Problems:   Closed right hip fracture, initial encounter ([place])   Leukocytosis   Hip fracture (HCC)   Dysuria  ] closed right hip fracture patient to go to OR today.  Or tho following.  Continue pain control.  Add bowel regime.  Continue Lovenox for 4 weeks. 2] hypertension patient does not take any BP medications at home.  I will place her on IV hydralazine as needed.  And will DC on p.o. medications if needed. 3] leukocytosis follow-up CBC tomorrow obtain UA.  DVT prophylaxis: SCD Code Status full code Family Communication: No family available Disposition Plan:  TBD Consultants: Bobette Mo  Procedures: None Antimicrobials none  Subjective:  Objective: Vitals:   05/31/17 1621 05/31/17 2008 06/01/17 0030 06/01/17 0624  BP: (!) 147/90 (!) 162/66 (!) 152/59 (!) 156/52  Pulse: 85 99 97 79  Resp: 16 19 18 18   Temp: 97.7 F (36.5 C) 97.9 F (36.6 C) 97.6 F (36.4 C) 98 F (36.7 C)  TempSrc: Axillary Axillary Oral Oral  SpO2: 100% 100% 100% 100%  Weight:      Height:        Intake/Output Summary (Last 24 hours) at 06/01/2017 1125 Last data filed at 06/01/2017 0700 Gross per 24 hour  Intake 681.83 ml  Output 750 ml  Net -68.17 ml   Filed Weights   05/30/17 1639 05/30/17 2241  Weight: 53.5 kg (118 lb) 49.5 kg (109 lb 2 oz)    Examination:  General exam: Appears calm and comfortable  Respiratory system: Clear to auscultation. Respiratory effort normal. Cardiovascular system: S1 & S2 heard, RRR. No JVD, murmurs, rubs, gallops or clicks. No pedal edema. Gastrointestinal system: Abdomen is nondistended, soft and nontender. No organomegaly or masses felt. Normal bowel sounds heard. Central nervous system: Alert and oriented. No focal neurological deficits. Extremities: Symmetric 5 x 5 power.RIGHT HIP DRESSING ON.Skin: No rashes, lesions or ulcers Psychiatry: Judgement and insight appear normal. Mood & affect appropriate.     Data Reviewed: I have personally reviewed following labs and imaging studies  CBC: Recent Labs  Lab 05/30/17 1837 05/31/17 0613 06/01/17 0350  WBC 12.9* 8.8 15.0*  NEUTROABS 11.3*  --  13.2*  HGB 11.1* 10.2* 9.5*  HCT 34.7* 31.6* 29.4*  MCV 96.4 96.0 94.5  PLT 190 175 960   Basic Metabolic Panel: Recent Labs  Lab 05/30/17 1837 05/31/17 0613 05/31/17 1216 06/01/17 0350  NA 136 135  --  138  K 4.2 4.0  --  4.1  CL 102 102  --  101  CO2 21* 24  --  26  GLUCOSE 136* 114*  --  136*  BUN 15 13  --  14  CREATININE 0.81 0.71 0.76 0.87  CALCIUM 8.9 8.6*  --  9.2   GFR: Estimated Creatinine Clearance: 35.6 mL/min (by C-G  formula based on SCr of 0.87 mg/dL). Liver Function Tests: Recent Labs  Lab 05/30/17 1837 05/31/17 0613  AST 46*  --   ALT 12*  --   ALKPHOS 70  --   BILITOT 0.6  --   PROT 6.7  --   ALBUMIN 3.6 3.2*   No results for input(s): LIPASE, AMYLASE in the last 168 hours. No results for input(s): AMMONIA in the last 168 hours. Coagulation Profile: Recent Labs  Lab 05/30/17 1837  INR 1.01   Cardiac Enzymes: No results for input(s): CKTOTAL, CKMB, CKMBINDEX, TROPONINI in the last 168 hours. BNP (last 3 results) No results for input(s): PROBNP in the last 8760 hours. HbA1C: No results for input(s): HGBA1C in the last 72 hours. CBG: No results for input(s): GLUCAP in the last 168 hours. Lipid Profile: No results for input(s): CHOL, HDL, LDLCALC, TRIG, CHOLHDL, LDLDIRECT in the last 72 hours. Thyroid Function Tests: No results for input(s): TSH, T4TOTAL, FREET4, T3FREE, THYROIDAB in the last 72 hours. Anemia Panel: No results for input(s): VITAMINB12, FOLATE, FERRITIN, TIBC, IRON, RETICCTPCT in the last 72 hours. Sepsis Labs: No results for input(s): PROCALCITON, LATICACIDVEN in the last 168 hours.  Recent Results (from the past 240 hour(s))  Surgical pcr screen     Status: None   Collection Time: 05/30/17 11:38 PM  Result Value Ref Range Status   MRSA, PCR NEGATIVE NEGATIVE Final   Staphylococcus aureus NEGATIVE NEGATIVE Final    Comment: (NOTE) The Xpert SA Assay (FDA approved for NASAL specimens in patients 20 years of age and older), is one component of a comprehensive surveillance program. It is not intended to diagnose infection nor to guide or monitor treatment. Performed at [facility], [place] [address], [place], [place] [address]          Radiology Studies: Dg Chest 1 View  Result Date: 05/30/2017 CLINICAL DATA:  Fall, hip pain EXAM: CHEST 1 VIEW COMPARISON:  None. FINDINGS: Heart size and mediastinal contours are within normal limits. Coarse lung  markings bilaterally suggests underlying chronic interstitial lung disease. No confluent opacity to suggest pneumonia. No pleural effusion or pneumothorax seen. No acute or suspicious osseous finding. IMPRESSION: No active disease. No evidence of pneumonia or pulmonary edema. Suspected chronic interstitial lung disease. Electronically Signed   By: Franki Cabot M.D.   On: 05/30/2017 18:02   Ct Head Wo Contrast  Result Date: 05/30/2017 CLINICAL DATA:  84 y/o  F; fall with head injury. EXAM: CT HEAD WITHOUT CONTRAST CT CERVICAL SPINE WITHOUT CONTRAST TECHNIQUE: Multidetector CT imaging of the head and cervical spine was performed following the standard protocol without intravenous contrast. Multiplanar CT image reconstructions of the cervical spine were also generated. COMPARISON:  05/16/2016 CT head 05/17/2016 MRI head. FINDINGS: CT HEAD FINDINGS Brain: No evidence of acute infarction, hemorrhage, hydrocephalus, extra-axial collection or mass lesion/mass effect. Stable moderate chronic microvascular ischemic changes and parenchymal volume loss of the brain given  differences in technique. Small chronic lacunar infarction within the left thalamus. Vascular: Calcific atherosclerosis of carotid siphons. No hyperdense vessel identified. Skull: Right parietal region small scalp contusion. No calvarial fracture. Sinuses/Orbits: Left maxillary sinus extensive mucosal thickening in chronic inflammatory changes of the sinus walls. Bilateral intra-ocular lens replacement. Otherwise negative. Other: None. CT CERVICAL SPINE FINDINGS Alignment: Normal cervical lordosis without listhesis. Leftward curvature at cervicothoracic junction. Skull base and vertebrae: No acute fracture. No primary bone lesion or focal pathologic process. Soft tissues and spinal canal: No prevertebral fluid or swelling. No visible canal hematoma. Disc levels: Multilevel disc and facet degenerative changes greatest at the C4-5 and C5-6 levels.  Uncovertebral and facet hypertrophy results in moderate bilateral bony foraminal stenosis at the C5-6 level and mild left-sided stenosis at the C4-5 level. 5 mm central C4-5 disc protrusion mild-to-moderate canal stenosis and anterior cord impingement. Upper chest: Biapical pleuroparenchymal scarring. Other: Negative. IMPRESSION: CT head: 1. No acute intracranial abnormality or calvarial fracture identified. 2. Right parietal region small scalp contusion. 3. Stable moderate chronic microvascular ischemic changes and parenchymal volume loss of the brain given differences in technique. 4. Extensive left chronic maxillary sinus disease. CT cervical spine: 1. No acute fracture or dislocation identified. 2. Cervical spondylosis greatest at C4-5 and C5-6 levels. 3. C4-5 central disc protrusion with mild-to-moderate canal stenosis and anterior cord impingement. Electronically Signed   By: Kristine Garbe M.D.   On: 05/30/2017 18:50   Ct Cervical Spine Wo Contrast  Result Date: 05/30/2017 CLINICAL DATA:  84 y/o  F; fall with head injury. EXAM: CT HEAD WITHOUT CONTRAST CT CERVICAL SPINE WITHOUT CONTRAST TECHNIQUE: Multidetector CT imaging of the head and cervical spine was performed following the standard protocol without intravenous contrast. Multiplanar CT image reconstructions of the cervical spine were also generated. COMPARISON:  05/16/2016 CT head 05/17/2016 MRI head. FINDINGS: CT HEAD FINDINGS Brain: No evidence of acute infarction, hemorrhage, hydrocephalus, extra-axial collection or mass lesion/mass effect. Stable moderate chronic microvascular ischemic changes and parenchymal volume loss of the brain given differences in technique. Small chronic lacunar infarction within the left thalamus. Vascular: Calcific atherosclerosis of carotid siphons. No hyperdense vessel identified. Skull: Right parietal region small scalp contusion. No calvarial fracture. Sinuses/Orbits: Left maxillary sinus extensive  mucosal thickening in chronic inflammatory changes of the sinus walls. Bilateral intra-ocular lens replacement. Otherwise negative. Other: None. CT CERVICAL SPINE FINDINGS Alignment: Normal cervical lordosis without listhesis. Leftward curvature at cervicothoracic junction. Skull base and vertebrae: No acute fracture. No primary bone lesion or focal pathologic process. Soft tissues and spinal canal: No prevertebral fluid or swelling. No visible canal hematoma. Disc levels: Multilevel disc and facet degenerative changes greatest at the C4-5 and C5-6 levels. Uncovertebral and facet hypertrophy results in moderate bilateral bony foraminal stenosis at the C5-6 level and mild left-sided stenosis at the C4-5 level. 5 mm central C4-5 disc protrusion mild-to-moderate canal stenosis and anterior cord impingement. Upper chest: Biapical pleuroparenchymal scarring. Other: Negative. IMPRESSION: CT head: 1. No acute intracranial abnormality or calvarial fracture identified. 2. Right parietal region small scalp contusion. 3. Stable moderate chronic microvascular ischemic changes and parenchymal volume loss of the brain given differences in technique. 4. Extensive left chronic maxillary sinus disease. CT cervical spine: 1. No acute fracture or dislocation identified. 2. Cervical spondylosis greatest at C4-5 and C5-6 levels. 3. C4-5 central disc protrusion with mild-to-moderate canal stenosis and anterior cord impingement. Electronically Signed   By: Kristine Garbe M.D.   On: 05/30/2017 18:50   Dg C-arm  1-60 Min  Result Date: 05/31/2017 CLINICAL DATA:  Right femoral nail. EXAM: RIGHT FEMUR 2 VIEWS; DG C-ARM 61-120 MIN COMPARISON:  None. FINDINGS: 5 intraoperative spot fluoro films submitted. Features show placement of a dynamic hip screw with long antegrade IM femoral nail. Bony alignment is markedly improved compared to the pre reduction exam. No evidence for immediate hardware complications. IMPRESSION: Status post  ORIF for comminuted intertrochanteric right femoral neck fracture. No evidence for immediate hardware complications. Electronically Signed   By: Misty Stanley M.D.   On: 05/31/2017 12:26   Dg Hip Unilat W Or Wo Pelvis 1 View Right  Result Date: 05/30/2017 CLINICAL DATA:  Fall.  Right hip pain. EXAM: DG HIP (WITH OR WITHOUT PELVIS) 1V RIGHT COMPARISON:  None. FINDINGS: Frontal pelvis shows diffuse demineralization. AP and frog-leg lateral views of the right hip reveal comminuted intertrochanteric right femoral neck fracture with varus angulation. SI joints and symphysis pubis unremarkable. IMPRESSION: Comminuted intertrochanteric right femoral neck fracture with varus angulation. Electronically Signed   By: Misty Stanley M.D.   On: 05/30/2017 17:57   Dg Femur 1 View Right  Result Date: 05/30/2017 CLINICAL DATA:  Fall, right hip pain EXAM: RIGHT FEMUR 1 VIEW COMPARISON:  None. FINDINGS: Displaced/comminuted fracture of the right femoral neck, intertrochanteric. Right femoral head remains well positioned relative to the acetabulum. Distal femur appears intact and normally aligned. IMPRESSION: Displaced/comminuted fracture of the right femoral neck, intertrochanteric, with nearly 90 degrees angulation at the fracture site. Electronically Signed   By: Franki Cabot M.D.   On: 05/30/2017 18:01   Dg Femur, Min 2 Views Right  Result Date: 05/31/2017 CLINICAL DATA:  Right femoral nail. EXAM: RIGHT FEMUR 2 VIEWS; DG C-ARM 61-120 MIN COMPARISON:  None. FINDINGS: 5 intraoperative spot fluoro films submitted. Features show placement of a dynamic hip screw with long antegrade IM femoral nail. Bony alignment is markedly improved compared to the pre reduction exam. No evidence for immediate hardware complications. IMPRESSION: Status post ORIF for comminuted intertrochanteric right femoral neck fracture. No evidence for immediate hardware complications. Electronically Signed   By: Misty Stanley M.D.   On: 05/31/2017  12:26   Dg Femur [place] 2 Views Right  Result Date: 05/31/2017 CLINICAL DATA:  Postop right femoral intramedullary nail. EXAM: RIGHT FEMUR PORTABLE 2 VIEW COMPARISON:  Earlier same day as well as 05/30/2017 FINDINGS: Examination demonstrates evidence of a right femoral intramedullary nail with associated screw bridging patient's trochanteric fracture into the femoral head intact and normally located. There is anatomic alignment about the fracture site. Minimal degenerate change of the right hip. IMPRESSION: Hardware intact and normally located post placement of right femoral intramedullary nail with associated screw bridging the intertrochanteric fracture into the femoral head. Anatomic alignment about the fracture site. Electronically Signed   By: Marin Olp M.D.   On: 05/31/2017 11:18        Scheduled Meds: . celecoxib  100 mg Oral Daily  . enoxaparin (LOVENOX) injection  40 mg Subcutaneous Q24H  . polyethylene glycol  17 g Oral Daily   Continuous Infusions: . lactated ringers 10 mL/hr at 06/01/17 0639  . methocarbamol (ROBAXIN)  IV       LOS: 2 days    Georgette Shell, MD [facility] If 7PM-7AM, please contact night-coverage [email or web address] Password [facility] 06/01/2017, 11:25 AM

## 2017-06-02 ENCOUNTER — Encounter (HOSPITAL_COMMUNITY): Payer: Self-pay | Admitting: General Practice

## 2017-06-02 DIAGNOSIS — S72001A Fracture of unspecified part of neck of right femur, initial encounter for closed fracture: Secondary | ICD-10-CM

## 2017-06-02 DIAGNOSIS — W19XXXA Unspecified fall, initial encounter: Secondary | ICD-10-CM

## 2017-06-02 LAB — CBC WITH DIFFERENTIAL/PLATELET
Basophils Absolute: 0 K/uL (ref 0.0–0.1)
Basophils Relative: 0 %
Eosinophils Absolute: 0.1 K/uL (ref 0.0–0.7)
Eosinophils Relative: 1 %
HCT: 27.6 % — ABNORMAL LOW (ref 36.0–46.0)
Hemoglobin: 9 g/dL — ABNORMAL LOW (ref 12.0–15.0)
Lymphocytes Relative: 19 %
Lymphs Abs: 1.9 K/uL (ref 0.7–4.0)
MCH: 31.5 pg (ref 26.0–34.0)
MCHC: 32.6 g/dL (ref 30.0–36.0)
MCV: 96.5 fL (ref 78.0–100.0)
Monocytes Absolute: 0.7 K/uL (ref 0.1–1.0)
Monocytes Relative: 8 %
Neutro Abs: 7.3 K/uL (ref 1.7–7.7)
Neutrophils Relative %: 72 %
Platelets: 185 K/uL (ref 150–400)
RBC: 2.86 MIL/uL — ABNORMAL LOW (ref 3.87–5.11)
RDW: 12.5 % (ref 11.5–15.5)
WBC: 9.9 K/uL (ref 4.0–10.5)

## 2017-06-02 LAB — BASIC METABOLIC PANEL
ANION GAP: 9 (ref 5–15)
BUN: 17 mg/dL (ref 6–20)
CHLORIDE: 104 mmol/L (ref 101–111)
CO2: 25 mmol/L (ref 22–32)
Calcium: 8.6 mg/dL — ABNORMAL LOW (ref 8.9–10.3)
Creatinine, Ser: 0.84 mg/dL (ref 0.44–1.00)
GFR calc Af Amer: >60 mL/min (ref >60)
Glucose, Bld: 106 mg/dL — ABNORMAL HIGH (ref 65–99)
Potassium: 4 mmol/L (ref 3.5–5.1)
SODIUM: 138 mmol/L (ref 135–145)

## 2017-06-02 LAB — URINE CULTURE: Culture: NO GROWTH

## 2017-06-02 MED ORDER — METOPROLOL SUCCINATE ER 25 MG PO TB24: 12.5000 mg | ORAL_TABLET | Freq: Every day | ORAL | 0 refills | Status: DC

## 2017-06-02 MED ORDER — METOPROLOL SUCCINATE ER 25 MG PO TB24
12.5000 mg | ORAL_TABLET | Freq: Every day | ORAL | Status: DC
  Administered 2017-06-02 – 2017-06-03 (×2): 12.5 mg via ORAL
  Filled 2017-06-02 (×2): qty 1

## 2017-06-02 MED ORDER — HYDROCODONE-ACETAMINOPHEN 5-325 MG PO TABS: 1.0000 | ORAL_TABLET | Freq: Four times a day (QID) | ORAL | 0 refills | Status: DC | PRN

## 2017-06-02 MED ORDER — POLYETHYLENE GLYCOL 3350 17 G PO PACK: 17.0000 g | PACK | Freq: Every day | ORAL | 0 refills | Status: DC

## 2017-06-02 MED ORDER — CYCLOBENZAPRINE HCL 5 MG PO TABS: 5.0000 mg | ORAL_TABLET | Freq: Three times a day (TID) | ORAL | 0 refills | Status: DC | PRN

## 2017-06-02 MED ORDER — OXYCODONE HCL 5 MG PO TABS: 5.0000 mg | ORAL_TABLET | ORAL | 0 refills | Status: DC | PRN

## 2017-06-02 MED ORDER — ENOXAPARIN SODIUM 40 MG/0.4ML ~~LOC~~ SOLN: 40.0000 mg | SUBCUTANEOUS | 1 refills | Status: DC

## 2017-06-02 NOTE — Plan of Care (Signed)
  Progressing Education: Knowledge of General Education information will improve 06/02/2017 1131 - Progressing by Dillen Belmontes, Vertell Limber, RN Health Behavior/Discharge Planning: Ability to manage health-related needs will improve 06/02/2017 1131 - Progressing by Shavanna Furnari, Vertell Limber, RN Clinical Measurements: Ability to maintain clinical measurements within normal limits will improve 06/02/2017 1131 - Progressing by Linwood Gullikson, Vertell Limber, RN Will remain free from infection 06/02/2017 1131 - Progressing by Evalee Jefferson, RN Diagnostic test results will improve 06/02/2017 1131 - Progressing by Evalee Jefferson, RN Respiratory complications will improve 06/02/2017 1131 - Progressing by Evalee Jefferson, RN Cardiovascular complication will be avoided 06/02/2017 1131 - Progressing by Evalee Jefferson, RN Activity: Risk for activity intolerance will decrease 06/02/2017 1131 - Progressing by Evalee Jefferson, RN Nutrition: Adequate nutrition will be maintained 06/02/2017 1131 - Progressing by Evalee Jefferson, RN Coping: Level of anxiety will decrease 06/02/2017 1131 - Progressing by Evalee Jefferson, RN Elimination: Will not experience complications related to bowel motility 06/02/2017 1131 - Progressing by Evalee Jefferson, RN Will not experience complications related to urinary retention 06/02/2017 1131 - Progressing by Kameka Whan, Vertell Limber, RN Pain Managment: General experience of comfort will improve 06/02/2017 1131 - Progressing by Evalee Jefferson, RN Safety: Ability to remain free from injury will improve 06/02/2017 1131 - Progressing by Evalee Jefferson, RN Skin Integrity: Risk for impaired skin integrity will decrease 06/02/2017 1131 - Progressing by Latrese Carolan, Vertell Limber, RN

## 2017-06-02 NOTE — Social Work (Signed)
CSW contacted daughter this morning and she indicated that they have accepted SNF bed offer from [facility].  CSW confirmed bed offer with [facility] in admissions and sent clinicals for review.  Elissa Hefty, LCSW Clinical Social Worker [phone]

## 2017-06-02 NOTE — Progress Notes (Signed)
ORTHOPAEDIC PROGRESS NOTE  s/p Procedure(s): R INTRAMEDULLARY (IM) NAIL INTERTROCHANTRIC  SUBJECTIVE: Reports mild pain about operative site. No chest pain. No SOB. No nausea/vomiting. No other complaints.  OBJECTIVE: PE: RLE: incision CDI, leg lengths equal, warm well perfused foot, intact EHL/TA/GSC   Vitals:   06/02/17 0518 06/02/17 1330  BP: (!) 170/66 140/69  Pulse: (!) 103 (!) 101  Resp:  17  Temp: 97.9 F (36.6 C) 98.4 F (36.9 C)  SpO2: 100% 98%     ASSESSMENT: Kimberly Ramirez is a 84 y.o. female doing well postoperatively.  PLAN: Weightbearing: WBAT RLE Insicional and dressing care: OK to remove dressings POD3 and leave open to air with dry gauze PRN Orthopedic device(s): None Showering: POD3 VTE prophylaxis: Lovenox 40mg  qd 4 weeks Pain control: prn meds Follow - up plan: 2 weeks Contact information:  Weekdays 8-5 Ophelia Charter MD [phone], After hours and holidays please check [email or web address] for group call information for [facility]

## 2017-06-02 NOTE — Discharge Summary (Addendum)
Physician Discharge Summary  Kimberly Ramirez VHQ:[id number] DOB: 1927-11-19 DOA: 05/30/2017  PCP: Lajean Manes, MD  Admit date: 05/30/2017 Discharge date: 06/03/2017 Admitted From: home 1. Disposition:snf  2. Follow up with PCP in 1-2 weeks 3. Please obtain BMP/CBC in one week 4. Follow-up with Ortho Home Health: None Equipment/Devices none  Discharge Condition stable CODE STATUS full code Diet recommendation cardiac diet Brief/Interim Summary: Narrative84 y.o.femalewith medical history significant of recent TIA   Presented withmechanical fall earlier today resulting in shortened right leg unable toambulate Patient reportedly was trying to sit downon the chairtripped on the rugbut missed it and fell backwards on the floor   Reports some recent dysuria, and constipation No syncope, reports slight head injury Of note patient was admitted for TIA 1 year ago at that time echogram showed preserved EF she was started on full dose aspirin 325but she have self discontinued  Denies chest pain. No shortness of breath. She is able to walk upa flight ofstairsor a block.  Reports she stopped taking all of her medications  While in [facility] been seen by orthopedics in ER plant to operate tomorrow afternoon  Significant initial Findings: WBC 12.9 Hg 11.1 plt 190 INR 1.01 R hipcomminuted intertrochanteric right femoral neck fracture with varus angulation  CT headnon-acute CT neck non-acute CXR non -acute  ER provider discussed case with:Orthopedics Dax Griffin Basil With Harlin Heys recommends:medical admission We'll see patient in consult in theER     Discharge Diagnoses:  Active Problems:   Closed right hip fracture, initial encounter ([place])   Leukocytosis   Hip fracture (HCC)   Dysuria closed right hip fracture status post ORIF.  Patient to follow up with Ortho.  Continue Lovenox for a total of 28 days. 2] hypertension patient does not take any BP  medications at home.  I will place her on metoprolol12.5 mg daily. 3] leukocytosis-improved with IV hydration.  UA negative.    Discharge Instructions  Discharge Instructions    Discharge instructions   Complete by:  As directed    Ophelia Charter MD, MPH [place] [address] [phone] (tel)   [phone] (fax)   POST-OPERATIVE INSTRUCTIONS   WOUND CARE  You may shower on Post-Op Day #3. Dressing can be removed and ok to shower and dry dressing can be placed as you see fit if there is any drainage.  EXERCISES Follow the instructions of your therapist.  No specific exercises necessary outside of this.  POST-OP MEDICINES A multi-modal approach will be used to treat your pain. Oxycodone - This is a strong narcotic, to be used only on an "as needed" basis for pain. Acetaminophen 500mg - A non-narcotic pain medicine.  Use 1000mg  three times a day for the first 14 days after surgery   Zofran 4mg  - This is an anti-nausea medicine to be used only if you are having nausea or vomitting.  FOLLOW-UP If you develop a Fever (<101.5), Redness or Drainage from the surgical incision site, please call our office to arrange for an evaluation. Please call the office to schedule a follow-up appointment for your suture removal, 10-14 days post-operatively.  IF YOU HAVE ANY QUESTIONS, PLEASE FEEL FREE TO CALL OUR OFFICE.  HELPFUL INFORMATION You should wean off your narcotic medicines as soon as you are able.  Most patients will be off or using minimal narcotics before their first postop appointment.   We suggest you use the pain medication the first night prior to going to bed, in order to  ease any pain when the anesthesia wears off. You should avoid taking pain medications on an empty stomach as it will make you nauseous.  Do not drink alcoholic beverages or take illicit drugs when taking pain medications.  Pain medication may make you constipated.  Below  are a few solutions to try in this order: Decrease the amount of pain medication if you aren't having pain. Drink lots of decaffeinated fluids. Drink prune juice and/or each dried prunes  If the first 3 don't work start with additional solutions Take Colace - an over-the-counter stool softener Take Senokot - an over-the-counter laxative Take Miralax - a stronger over-the-counter laxative     Allergies as of 06/02/2017   No Known Allergies     Medication List    STOP taking these medications   aspirin 325 MG tablet     TAKE these medications   acetaminophen 500 MG tablet Commonly known as:  TYLENOL Take 2 tablets (1,000 mg total) by mouth every 8 (eight) hours for 14 days. What changed:    how much to take  when to take this  reasons to take this   atorvastatin 40 MG tablet Commonly known as:  LIPITOR Take 1 tablet (40 mg total) by mouth daily at 6 PM.   clobetasol cream 0.05 % Commonly known as:  TEMOVATE Apply 1 application topically See admin instructions. Apply to vulva twice daily as directed   cyclobenzaprine 5 MG tablet Commonly known as:  FLEXERIL Take 1 tablet (5 mg total) by mouth 3 (three) times daily as needed for muscle spasms.   enoxaparin 40 MG/0.4ML injection Commonly known as:  LOVENOX Inject 0.4 mLs (40 mg total) into the skin daily for 28 days.   HYDROcodone-acetaminophen 5-325 MG tablet Commonly known as:  NORCO/VICODIN Take 1-2 tablets by mouth every 6 (six) hours as needed for moderate pain.   oxyCODONE 5 MG immediate release tablet Commonly known as:  Oxy IR/ROXICODONE Take 1-2 tablets (5-10 mg total) by mouth every 4 (four) hours as needed for breakthrough pain ((for MODERATE breakthrough pain)).   polyethylene glycol packet Commonly known as:  MIRALAX / GLYCOLAX Take 17 g by mouth daily. Start taking on:  06/03/2017      Follow-up Information    Hiram Gash, MD Follow up in 2 week(s).   Specialty:  Orthopedic Surgery Contact  information: [address] [place] [address] [phone]        Lajean Manes, MD Follow up.   Specialty:  Internal Medicine Contact information: [address] [place] [address] [phone]          No Known Allergies  Consultations: ortho  Procedures/Studies: Dg Chest 1 View  Result Date: 05/30/2017 CLINICAL DATA:  Fall, hip pain EXAM: CHEST 1 VIEW COMPARISON:  None. FINDINGS: Heart size and mediastinal contours are within normal limits. Coarse lung markings bilaterally suggests underlying chronic interstitial lung disease. No confluent opacity to suggest pneumonia. No pleural effusion or pneumothorax seen. No acute or suspicious osseous finding. IMPRESSION: No active disease. No evidence of pneumonia or pulmonary edema. Suspected chronic interstitial lung disease. Electronically Signed   By: Franki Cabot M.D.   On: 05/30/2017 18:02   Ct Head Wo Contrast  Result Date: 05/30/2017 CLINICAL DATA:  84 y/o  F; fall with head injury. EXAM: CT HEAD WITHOUT CONTRAST CT CERVICAL SPINE WITHOUT CONTRAST TECHNIQUE: Multidetector CT imaging of the head and cervical spine was performed following the standard protocol without  intravenous contrast. Multiplanar CT image reconstructions of the cervical spine were also generated. COMPARISON:  05/16/2016 CT head 05/17/2016 MRI head. FINDINGS: CT HEAD FINDINGS Brain: No evidence of acute infarction, hemorrhage, hydrocephalus, extra-axial collection or mass lesion/mass effect. Stable moderate chronic microvascular ischemic changes and parenchymal volume loss of the brain given differences in technique. Small chronic lacunar infarction within the left thalamus. Vascular: Calcific atherosclerosis of carotid siphons. No hyperdense vessel identified. Skull: Right parietal region small scalp contusion. No calvarial fracture. Sinuses/Orbits: Left maxillary sinus extensive mucosal thickening in chronic inflammatory changes  of the sinus walls. Bilateral intra-ocular lens replacement. Otherwise negative. Other: None. CT CERVICAL SPINE FINDINGS Alignment: Normal cervical lordosis without listhesis. Leftward curvature at cervicothoracic junction. Skull base and vertebrae: No acute fracture. No primary bone lesion or focal pathologic process. Soft tissues and spinal canal: No prevertebral fluid or swelling. No visible canal hematoma. Disc levels: Multilevel disc and facet degenerative changes greatest at the C4-5 and C5-6 levels. Uncovertebral and facet hypertrophy results in moderate bilateral bony foraminal stenosis at the C5-6 level and mild left-sided stenosis at the C4-5 level. 5 mm central C4-5 disc protrusion mild-to-moderate canal stenosis and anterior cord impingement. Upper chest: Biapical pleuroparenchymal scarring. Other: Negative. IMPRESSION: CT head: 1. No acute intracranial abnormality or calvarial fracture identified. 2. Right parietal region small scalp contusion. 3. Stable moderate chronic microvascular ischemic changes and parenchymal volume loss of the brain given differences in technique. 4. Extensive left chronic maxillary sinus disease. CT cervical spine: 1. No acute fracture or dislocation identified. 2. Cervical spondylosis greatest at C4-5 and C5-6 levels. 3. C4-5 central disc protrusion with mild-to-moderate canal stenosis and anterior cord impingement. Electronically Signed   By: Kristine Garbe M.D.   On: 05/30/2017 18:50   Ct Cervical Spine Wo Contrast  Result Date: 05/30/2017 CLINICAL DATA:  84 y/o  F; fall with head injury. EXAM: CT HEAD WITHOUT CONTRAST CT CERVICAL SPINE WITHOUT CONTRAST TECHNIQUE: Multidetector CT imaging of the head and cervical spine was performed following the standard protocol without intravenous contrast. Multiplanar CT image reconstructions of the cervical spine were also generated. COMPARISON:  05/16/2016 CT head 05/17/2016 MRI head. FINDINGS: CT HEAD FINDINGS Brain: No  evidence of acute infarction, hemorrhage, hydrocephalus, extra-axial collection or mass lesion/mass effect. Stable moderate chronic microvascular ischemic changes and parenchymal volume loss of the brain given differences in technique. Small chronic lacunar infarction within the left thalamus. Vascular: Calcific atherosclerosis of carotid siphons. No hyperdense vessel identified. Skull: Right parietal region small scalp contusion. No calvarial fracture. Sinuses/Orbits: Left maxillary sinus extensive mucosal thickening in chronic inflammatory changes of the sinus walls. Bilateral intra-ocular lens replacement. Otherwise negative. Other: None. CT CERVICAL SPINE FINDINGS Alignment: Normal cervical lordosis without listhesis. Leftward curvature at cervicothoracic junction. Skull base and vertebrae: No acute fracture. No primary bone lesion or focal pathologic process. Soft tissues and spinal canal: No prevertebral fluid or swelling. No visible canal hematoma. Disc levels: Multilevel disc and facet degenerative changes greatest at the C4-5 and C5-6 levels. Uncovertebral and facet hypertrophy results in moderate bilateral bony foraminal stenosis at the C5-6 level and mild left-sided stenosis at the C4-5 level. 5 mm central C4-5 disc protrusion mild-to-moderate canal stenosis and anterior cord impingement. Upper chest: Biapical pleuroparenchymal scarring. Other: Negative. IMPRESSION: CT head: 1. No acute intracranial abnormality or calvarial fracture identified. 2. Right parietal region small scalp contusion. 3. Stable moderate chronic microvascular ischemic changes and parenchymal volume loss of the brain given differences in technique. 4. Extensive left chronic  maxillary sinus disease. CT cervical spine: 1. No acute fracture or dislocation identified. 2. Cervical spondylosis greatest at C4-5 and C5-6 levels. 3. C4-5 central disc protrusion with mild-to-moderate canal stenosis and anterior cord impingement. Electronically  Signed   By: Kristine Garbe M.D.   On: 05/30/2017 18:50   Dg C-arm 1-60 Min  Result Date: 05/31/2017 CLINICAL DATA:  Right femoral nail. EXAM: RIGHT FEMUR 2 VIEWS; DG C-ARM 61-120 MIN COMPARISON:  None. FINDINGS: 5 intraoperative spot fluoro films submitted. Features show placement of a dynamic hip screw with long antegrade IM femoral nail. Bony alignment is markedly improved compared to the pre reduction exam. No evidence for immediate hardware complications. IMPRESSION: Status post ORIF for comminuted intertrochanteric right femoral neck fracture. No evidence for immediate hardware complications. Electronically Signed   By: Misty Stanley M.D.   On: 05/31/2017 12:26   Dg Hip Unilat W Or Wo Pelvis 1 View Right  Result Date: 05/30/2017 CLINICAL DATA:  Fall.  Right hip pain. EXAM: DG HIP (WITH OR WITHOUT PELVIS) 1V RIGHT COMPARISON:  None. FINDINGS: Frontal pelvis shows diffuse demineralization. AP and frog-leg lateral views of the right hip reveal comminuted intertrochanteric right femoral neck fracture with varus angulation. SI joints and symphysis pubis unremarkable. IMPRESSION: Comminuted intertrochanteric right femoral neck fracture with varus angulation. Electronically Signed   By: Misty Stanley M.D.   On: 05/30/2017 17:57   Dg Femur 1 View Right  Result Date: 05/30/2017 CLINICAL DATA:  Fall, right hip pain EXAM: RIGHT FEMUR 1 VIEW COMPARISON:  None. FINDINGS: Displaced/comminuted fracture of the right femoral neck, intertrochanteric. Right femoral head remains well positioned relative to the acetabulum. Distal femur appears intact and normally aligned. IMPRESSION: Displaced/comminuted fracture of the right femoral neck, intertrochanteric, with nearly 90 degrees angulation at the fracture site. Electronically Signed   By: Franki Cabot M.D.   On: 05/30/2017 18:01   Dg Femur, Min 2 Views Right  Result Date: 05/31/2017 CLINICAL DATA:  Right femoral nail. EXAM: RIGHT FEMUR 2 VIEWS; DG  C-ARM 61-120 MIN COMPARISON:  None. FINDINGS: 5 intraoperative spot fluoro films submitted. Features show placement of a dynamic hip screw with long antegrade IM femoral nail. Bony alignment is markedly improved compared to the pre reduction exam. No evidence for immediate hardware complications. IMPRESSION: Status post ORIF for comminuted intertrochanteric right femoral neck fracture. No evidence for immediate hardware complications. Electronically Signed   By: Misty Stanley M.D.   On: 05/31/2017 12:26   Dg Femur [place] 2 Views Right  Result Date: 05/31/2017 CLINICAL DATA:  Postop right femoral intramedullary nail. EXAM: RIGHT FEMUR PORTABLE 2 VIEW COMPARISON:  Earlier same day as well as 05/30/2017 FINDINGS: Examination demonstrates evidence of a right femoral intramedullary nail with associated screw bridging patient's trochanteric fracture into the femoral head intact and normally located. There is anatomic alignment about the fracture site. Minimal degenerate change of the right hip. IMPRESSION: Hardware intact and normally located post placement of right femoral intramedullary nail with associated screw bridging the intertrochanteric fracture into the femoral head. Anatomic alignment about the fracture site. Electronically Signed   By: Marin Olp M.D.   On: 05/31/2017 11:18    (Echo, Carotid, EGD, Colonoscopy, ERCP)    Subjective:   Discharge Exam: Vitals:   06/02/17 0131 06/02/17 0518  BP: (!) 133/38 (!) 170/66  Pulse: 88 (!) 103  Resp:    Temp: 98.8 F (37.1 C) 97.9 F (36.6 C)  SpO2: 98% 100%   Vitals:   06/01/17  1300 06/01/17 2150 06/02/17 0131 06/02/17 0518  BP: (!) 123/42 (!) 131/40 (!) 133/38 (!) 170/66  Pulse: 91 80 88 (!) 103  Resp: 18     Temp: 98 F (36.7 C) 98.7 F (37.1 C) 98.8 F (37.1 C) 97.9 F (36.6 C)  TempSrc: Oral Oral Oral Axillary  SpO2: 99% 98% 98% 100%  Weight:      Height:        General: Pt is alert, awake, not in acute  distress Cardiovascular: RRR, S1/S2 +, no rubs, no gallops Respiratory: CTA bilaterally, no wheezing, no rhonchi Abdominal: Soft, NT, ND, bowel sounds + Extremities: no edema, no cyanosis    The results of significant diagnostics from this hospitalization (including imaging, microbiology, ancillary and laboratory) are listed below for reference.     Microbiology: Recent Results (from the past 240 hour(s))  Surgical pcr screen     Status: None   Collection Time: 05/30/17 11:38 PM  Result Value Ref Range Status   MRSA, PCR NEGATIVE NEGATIVE Final   Staphylococcus aureus NEGATIVE NEGATIVE Final    Comment: (NOTE) The Xpert SA Assay (FDA approved for NASAL specimens in patients 67 years of age and older), is one component of a comprehensive surveillance program. It is not intended to diagnose infection nor to guide or monitor treatment. Performed at [facility], [place] [address], [place], [place] [address]      Labs: BNP (last 3 results) No results for input(s): BNP in the last 8760 hours. Basic Metabolic Panel: Recent Labs  Lab 05/30/17 1837 05/31/17 0613 05/31/17 1216 06/01/17 0350 06/02/17 0549  NA 136 135  --  138 138  K 4.2 4.0  --  4.1 4.0  CL 102 102  --  101 104  CO2 21* 24  --  26 25  GLUCOSE 136* 114*  --  136* 106*  BUN 15 13  --  14 17  CREATININE 0.81 0.71 0.76 0.87 0.84  CALCIUM 8.9 8.6*  --  9.2 8.6*   Liver Function Tests: Recent Labs  Lab 05/30/17 1837 05/31/17 0613  AST 46*  --   ALT 12*  --   ALKPHOS 70  --   BILITOT 0.6  --   PROT 6.7  --   ALBUMIN 3.6 3.2*   No results for input(s): LIPASE, AMYLASE in the last 168 hours. No results for input(s): AMMONIA in the last 168 hours. CBC: Recent Labs  Lab 05/30/17 1837 05/31/17 0613 06/01/17 0350 06/02/17 0549 06/02/17 0640  WBC 12.9* 8.8 15.0* 9.9 PENDING  NEUTROABS 11.3*  --  60.1* 7.3 DUPLICATE SEE [id number]  HGB 11.1* 10.2* 9.5* 9.0* PENDING  HCT 34.7* 31.6* 29.4* 27.6* PENDING   MCV 96.4 96.0 94.5 96.5 PENDING  PLT 190 175 184 185 PENDING   Cardiac Enzymes: No results for input(s): CKTOTAL, CKMB, CKMBINDEX, TROPONINI in the last 168 hours. BNP: Invalid input(s): POCBNP CBG: No results for input(s): GLUCAP in the last 168 hours. D-Dimer No results for input(s): DDIMER in the last 72 hours. Hgb A1c No results for input(s): HGBA1C in the last 72 hours. Lipid Profile No results for input(s): CHOL, HDL, LDLCALC, TRIG, CHOLHDL, LDLDIRECT in the last 72 hours. Thyroid function studies No results for input(s): TSH, T4TOTAL, T3FREE, THYROIDAB in the last 72 hours.  Invalid input(s): FREET3 Anemia work up No results for input(s): VITAMINB12, FOLATE, FERRITIN, TIBC, IRON, RETICCTPCT in the last 72 hours. Urinalysis    Component Value Date/Time   COLORURINE YELLOW 06/01/2017  [place] 06/01/2017 1516   LABSPEC 1.013 06/01/2017 1516   PHURINE 5.0 06/01/2017 1516   GLUCOSEU NEGATIVE 06/01/2017 1516   HGBUR NEGATIVE 06/01/2017 1516   BILIRUBINUR NEGATIVE 06/01/2017 1516   KETONESUR NEGATIVE 06/01/2017 1516   PROTEINUR NEGATIVE 06/01/2017 1516   NITRITE NEGATIVE 06/01/2017 1516   LEUKOCYTESUR NEGATIVE 06/01/2017 1516   Sepsis Labs Invalid input(s): PROCALCITONIN,  WBC,  LACTICIDVEN Microbiology Recent Results (from the past 240 hour(s))  Surgical pcr screen     Status: None   Collection Time: 05/30/17 11:38 PM  Result Value Ref Range Status   MRSA, PCR NEGATIVE NEGATIVE Final   Staphylococcus aureus NEGATIVE NEGATIVE Final    Comment: (NOTE) The Xpert SA Assay (FDA approved for NASAL specimens in patients 48 years of age and older), is one component of a comprehensive surveillance program. It is not intended to diagnose infection nor to guide or monitor treatment. Performed at [facility], [place] [address], [place], [place] [address]      Time coordinating discharge: Over 30 minutes  SIGNED:   Georgette Shell,  MD  [facility] 06/02/2017, 11:22 AM Pager   If 7PM-7AM, please contact night-coverage [email or web address] Password [email or web address]

## 2017-06-02 NOTE — Social Work (Signed)
CSW met with daughter at bedside. CSW and RN indicated that doctor has put in discharge as [facility] will not have a SNF bed until the morning.   CSW will f/u for disposition.   , LCSW Clinical Social Worker [phone]   

## 2017-06-02 NOTE — Progress Notes (Signed)
Physical Therapy Treatment Patient Details Name: Kimberly Ramirez MRN: [id number] DOB: 02/06/1928 Today's Date: 06/02/2017    History of Present Illness 84 yo female with onset of mechanical fall resulting in a R hip fracture with IM nailing 2/17, after comminuted intertrochanteric fem neck fracture.  PMHx:  cervical spondylosis, C4-5 central disc protrusion, cervical stenosis, I gait with no AD    PT Comments    Pt progressing slowly towards goals. Pt with increased pain and increased difficulty sequencing, so mobility limited to [facility] and then to chair. Pt requiring max a to mod A +2 throughout session. Current recommendations appropriate. Will continue to follow acutely to maximize functional mobility independence and safety.    Follow Up Recommendations  SNF     Equipment Recommendations  Rolling walker with 5" wheels    Recommendations for Other Services       Precautions / Restrictions Precautions Precautions: Fall Restrictions Weight Bearing Restrictions: Yes RLE Weight Bearing: Weight bearing as tolerated    Mobility  Bed Mobility Overal bed mobility: Needs Assistance Bed Mobility: Sit to Supine     Supine to sit: Max assist     General bed mobility comments: Pt in increased pain this session and very guarded. Required max A for scooting hips to EOB and for trunk elevation. Able to assist some with use of bed rails, however, pt slightly resistive to movement secondary to pain. Required verbal cues for sequencing.   Transfers Overall transfer level: Needs assistance Equipment used: Rolling walker (2 wheeled);1 person hand held assist Transfers: Sit to/from [facility] Sit to Stand: Max assist Stand pivot transfers: Max assist;Mod assist;+2 physical assistance       General transfer comment: Max A to stand and increased time. Required cues for hand placement and required increased time for processing cues for safe hand placement on RW as pt wanting to  hold onto PT. Pt with posterior lean upon standing and required manual assist to correct. Max A with +1 assist to perform transfer to [facility]. Max cues for sequencing and required manual assist to perform. Mod A +2 to perform from [facility] to recliner.   Ambulation/Gait             General Gait Details: Unable to attempt secondary to increased assist requried to perform transfers and difficulty sequencing.    Stairs            Wheelchair Mobility    Modified Rankin (Stroke Patients Only)       Balance Overall balance assessment: History of Falls;Needs assistance Sitting-balance support: Bilateral upper extremity supported;Feet supported Sitting balance-Leahy Scale: Fair     Standing balance support: Bilateral upper extremity supported;During functional activity Standing balance-Leahy Scale: Poor Standing balance comment: Reliant on UE and external support for balance.                             Cognition Arousal/Alertness: Awake/alert Behavior During Therapy: Flat affect Overall Cognitive Status: Impaired/Different from baseline Area of Impairment: Safety/judgement;Awareness;Memory;Following commands;Problem solving                     Memory: Decreased short-term memory;Decreased recall of precautions Following Commands: Follows one step commands with increased time Safety/Judgement: Decreased awareness of safety;Decreased awareness of deficits Awareness: Intellectual Problem Solving: Slow processing General Comments: Pt unable to recall information given at beginning of session. Required max cues for sequencing using RW. Upon standing, pt with difficulty processing motor  commands for safe hand placement on RW.       Exercises General Exercises - Lower Extremity Ankle Circles/Pumps: AROM;10 reps;Both    General Comments        Pertinent Vitals/Pain Pain Assessment: Faces Faces Pain Scale: Hurts whole lot Pain Location: R hip with  movement Pain Descriptors / Indicators: Operative site guarding;Tender Pain Intervention(s): Limited activity within patient's tolerance;Monitored during session;Repositioned    Home Living                      Prior Function            PT Goals (current goals can now be found in the care plan section) Acute Rehab PT Goals Patient Stated Goal: to get back home to read ( being there at rehab will probably keep me from my reading) PT Goal Formulation: With patient/family Time For Goal Achievement: 06/15/17 Potential to Achieve Goals: Good Progress towards PT goals: Progressing toward goals    Frequency    Min 4X/week      PT Plan Current plan remains appropriate    Co-evaluation              AM-PAC PT "6 Clicks" Daily Activity  Outcome Measure  Difficulty turning over in bed (including adjusting bedclothes, sheets and blankets)?: Unable Difficulty moving from lying on back to sitting on the side of the bed? : Unable Difficulty sitting down on and standing up from a chair with arms (e.g., wheelchair, bedside commode, etc,.)?: Unable Help needed moving to and from a bed to chair (including a wheelchair)?: A Lot Help needed walking in hospital room?: A Lot Help needed climbing 3-5 steps with a railing? : Total 6 Click Score: 8    End of Session Equipment Utilized During Treatment: Gait belt Activity Tolerance: Patient limited by pain Patient left: in chair;with call bell/phone within reach;with chair alarm set;with nursing/sitter in room Nurse Communication: Mobility status PT Visit Diagnosis: Unsteadiness on feet (R26.81);Muscle weakness (generalized) (M62.81);History of falling (Z91.81);Difficulty in walking, not elsewhere classified (R26.2);Pain Pain - Right/Left: Right Pain - part of body: Hip     Time: 0920-0949 PT Time Calculation (min) (ACUTE ONLY): 29 min  Charges:  $Therapeutic Activity: 23-37 mins                    G Codes:        Leighton Ruff, PT, DPT  Acute Rehabilitation Services  Pager: [phone]    Rudean Hitt 06/02/2017, 11:04 AM

## 2017-06-02 NOTE — Plan of Care (Signed)
  Adequate for Discharge Education: Knowledge of General Education information will improve 06/02/2017 1140 - Adequate for Discharge by Evalee Jefferson, RN 06/02/2017 1131 - Progressing by Evalee Jefferson, RN Health Behavior/Discharge Planning: Ability to manage health-related needs will improve 06/02/2017 1140 - Adequate for Discharge by Evalee Jefferson, RN 06/02/2017 1131 - Progressing by Evalee Jefferson, RN Clinical Measurements: Ability to maintain clinical measurements within normal limits will improve 06/02/2017 1140 - Adequate for Discharge by Evalee Jefferson, RN 06/02/2017 1131 - Progressing by Evalee Jefferson, RN Will remain free from infection 06/02/2017 1140 - Adequate for Discharge by Evalee Jefferson, RN 06/02/2017 1131 - Progressing by Evalee Jefferson, RN Diagnostic test results will improve 06/02/2017 1140 - Adequate for Discharge by Evalee Jefferson, RN 06/02/2017 1131 - Progressing by Evalee Jefferson, RN Respiratory complications will improve 06/02/2017 1140 - Adequate for Discharge by Evalee Jefferson, RN 06/02/2017 1131 - Progressing by Evalee Jefferson, RN Cardiovascular complication will be avoided 06/02/2017 1140 - Adequate for Discharge by Evalee Jefferson, RN 06/02/2017 1131 - Progressing by Evalee Jefferson, RN Activity: Risk for activity intolerance will decrease 06/02/2017 1140 - Adequate for Discharge by Evalee Jefferson, RN 06/02/2017 1131 - Progressing by Evalee Jefferson, RN Nutrition: Adequate nutrition will be maintained 06/02/2017 1140 - Adequate for Discharge by Evalee Jefferson, RN 06/02/2017 1131 - Progressing by Evalee Jefferson, RN Coping: Level of anxiety will decrease 06/02/2017 1140 - Adequate for Discharge by Evalee Jefferson, RN 06/02/2017 1131 - Progressing by Evalee Jefferson, RN Elimination: Will not experience complications  related to bowel motility 06/02/2017 1140 - Adequate for Discharge by Evalee Jefferson, RN 06/02/2017 1131 - Progressing by Evalee Jefferson, RN Will not experience complications related to urinary retention 06/02/2017 1140 - Adequate for Discharge by Evalee Jefferson, RN 06/02/2017 1131 - Progressing by Evalee Jefferson, RN Pain Managment: General experience of comfort will improve 06/02/2017 1140 - Adequate for Discharge by Evalee Jefferson, RN 06/02/2017 1131 - Progressing by Evalee Jefferson, RN Safety: Ability to remain free from injury will improve 06/02/2017 1140 - Adequate for Discharge by Evalee Jefferson, RN 06/02/2017 1131 - Progressing by Evalee Jefferson, RN Skin Integrity: Risk for impaired skin integrity will decrease 06/02/2017 1140 - Adequate for Discharge by Evalee Jefferson, RN 06/02/2017 1131 - Progressing by Evalee Jefferson, RN

## 2017-06-03 ENCOUNTER — Encounter (HOSPITAL_COMMUNITY): Payer: Self-pay | Admitting: Orthopaedic Surgery

## 2017-06-03 DIAGNOSIS — S72009A Fracture of unspecified part of neck of unspecified femur, initial encounter for closed fracture: Secondary | ICD-10-CM | POA: Diagnosis not present

## 2017-06-03 DIAGNOSIS — E785 Hyperlipidemia, unspecified: Secondary | ICD-10-CM | POA: Diagnosis not present

## 2017-06-03 DIAGNOSIS — Z4789 Encounter for other orthopedic aftercare: Secondary | ICD-10-CM | POA: Diagnosis not present

## 2017-06-03 DIAGNOSIS — R279 Unspecified lack of coordination: Secondary | ICD-10-CM | POA: Diagnosis not present

## 2017-06-03 DIAGNOSIS — Z111 Encounter for screening for respiratory tuberculosis: Secondary | ICD-10-CM | POA: Diagnosis not present

## 2017-06-03 DIAGNOSIS — S72144D Nondisplaced intertrochanteric fracture of right femur, subsequent encounter for closed fracture with routine healing: Secondary | ICD-10-CM | POA: Diagnosis not present

## 2017-06-03 DIAGNOSIS — I10 Essential (primary) hypertension: Secondary | ICD-10-CM | POA: Diagnosis not present

## 2017-06-03 DIAGNOSIS — M6281 Muscle weakness (generalized): Secondary | ICD-10-CM | POA: Diagnosis not present

## 2017-06-03 DIAGNOSIS — S72141D Displaced intertrochanteric fracture of right femur, subsequent encounter for closed fracture with routine healing: Secondary | ICD-10-CM | POA: Diagnosis not present

## 2017-06-03 DIAGNOSIS — R41841 Cognitive communication deficit: Secondary | ICD-10-CM | POA: Diagnosis not present

## 2017-06-03 DIAGNOSIS — M62838 Other muscle spasm: Secondary | ICD-10-CM | POA: Diagnosis not present

## 2017-06-03 DIAGNOSIS — D72829 Elevated white blood cell count, unspecified: Secondary | ICD-10-CM | POA: Diagnosis not present

## 2017-06-03 DIAGNOSIS — L292 Pruritus vulvae: Secondary | ICD-10-CM | POA: Diagnosis not present

## 2017-06-03 DIAGNOSIS — Z9181 History of falling: Secondary | ICD-10-CM | POA: Diagnosis not present

## 2017-06-03 DIAGNOSIS — F419 Anxiety disorder, unspecified: Secondary | ICD-10-CM | POA: Diagnosis not present

## 2017-06-03 DIAGNOSIS — S72009D Fracture of unspecified part of neck of unspecified femur, subsequent encounter for closed fracture with routine healing: Secondary | ICD-10-CM | POA: Diagnosis not present

## 2017-06-03 DIAGNOSIS — R262 Difficulty in walking, not elsewhere classified: Secondary | ICD-10-CM | POA: Diagnosis not present

## 2017-06-03 DIAGNOSIS — K5900 Constipation, unspecified: Secondary | ICD-10-CM | POA: Diagnosis not present

## 2017-06-03 DIAGNOSIS — G459 Transient cerebral ischemic attack, unspecified: Secondary | ICD-10-CM | POA: Diagnosis not present

## 2017-06-03 DIAGNOSIS — S92153A Displaced avulsion fracture (chip fracture) of unspecified talus, initial encounter for closed fracture: Secondary | ICD-10-CM | POA: Diagnosis not present

## 2017-06-03 DIAGNOSIS — G8911 Acute pain due to trauma: Secondary | ICD-10-CM | POA: Diagnosis not present

## 2017-06-03 DIAGNOSIS — R52 Pain, unspecified: Secondary | ICD-10-CM | POA: Diagnosis not present

## 2017-06-03 LAB — CBC
HCT: 26.8 % — ABNORMAL LOW (ref 36.0–46.0)
Hemoglobin: 8.5 g/dL — ABNORMAL LOW (ref 12.0–15.0)
MCH: 31 pg (ref 26.0–34.0)
MCHC: 31.7 g/dL (ref 30.0–36.0)
MCV: 97.8 fL (ref 78.0–100.0)
PLATELETS: 178 10*3/uL (ref 150–400)
RBC: 2.74 MIL/uL — ABNORMAL LOW (ref 3.87–5.11)
RDW: 12.6 % (ref 11.5–15.5)
WBC: 7.2 10*3/uL (ref 4.0–10.5)

## 2017-06-03 NOTE — Progress Notes (Addendum)
PROGRESS NOTE    Kimberly Ramirez  HYW:[id number] DOB: 14-Nov-1927 DOA: 05/30/2017 PCP: Lajean Manes, MD   Brief Narrative84 y.o.femalewith medical history significant of recent TIA   Presented withmechanical fall earlier today resulting in shortened right leg unable toambulate Patient reportedly was trying to sit downon the chairtripped on the rugbut missed it and fell backwards on the floor   Reports some recent dysuria, and constipation No syncope, reports slight head injury Of note patient was admitted for TIA 1 year ago at that time echogram showed preserved EF she was started on full dose aspirin 325but she have self discontinued  Denies chest pain. No shortness of breath. She is able to walk upa flight ofstairsor a block.  Reports she stopped taking all of her medications.  While in [facility] been seen by orthopedics in ER plant to operate tomorrow afternoon  Significant initial Findings: WBC 12.9 Hg 11.1 plt 190 INR 1.01 R hipcomminuted intertrochanteric right femoral neck fracture with varus angulation  CT headnon-acute CT neck non-acute CXR non -acute  ER provider discussed case with:Orthopedics Dax Griffin Basil With Harlin Heys recommends:medical admission We'll see patient in consult in theER     Assessment & Plan:   Active Problems:   Closed right hip fracture, initial encounter ([place])   Leukocytosis   Hip fracture (HCC)   Dysuria closed right hip fracture status post ORIF.  Patient to follow up with Ortho.  Continue Lovenox for a total of 28 days. 2]hypertension patient does not take any BP medications at home.  I will place her on metoprolol12.5 mg daily. 3]leukocytosis-improved with IV hydration.  UA negative.       DVT prophylaxis:lovenox  Family Communicationnone Disposition Plan Dc to snf  Consultants:ortho  Procedures:    Antimicrobials:none Subjective:no new complaints   Objective: Vitals:   06/02/17 0518 06/02/17 1330 06/02/17 2021 06/03/17 0431  BP: (!) 170/66 140/69 (!) 154/60 (!) 152/50  Pulse: (!) 103 (!) 101 (!) 106 89  Resp:  17 18 18   Temp: 97.9 F (36.6 C) 98.4 F (36.9 C) 98.4 F (36.9 C) 98.5 F (36.9 C)  TempSrc: Axillary Oral Oral Oral  SpO2: 100% 98% 100% 99%  Weight:      Height:        Intake/Output Summary (Last 24 hours) at 06/03/2017 0903 Last data filed at 06/03/2017 0700 Gross per 24 hour  Intake 836 ml  Output -  Net 836 ml   Filed Weights   05/30/17 1639 05/30/17 2241  Weight: 53.5 kg (118 lb) 49.5 kg (109 lb 2 oz)    Examination:  General exam: Appears calm and comfortable  Respiratory system: Clear to auscultation. Respiratory effort normal. Cardiovascular system: S1 & S2 heard, RRR. No JVD, murmurs, rubs, gallops or clicks. No pedal edema. Gastrointestinal system: Abdomen is nondistended, soft and nontender. No organomegaly or masses felt. Normal bowel sounds heard. Central nervous system: Alert and oriented. No focal neurological deficits. Extremities: Symmetric 5 x 5 power. Skin: No rashes, lesions or ulcers Psychiatry: Judgement and insight appear normal. Mood & affect appropriate.     Data Reviewed: I have personally reviewed following labs and imaging studies  CBC: Recent Labs  Lab 05/30/17 1837 05/31/17 0613 06/01/17 0350 06/02/17 0549 06/02/17 0640 06/03/17 0523  WBC 12.9* 8.8 15.0* 9.9 PENDING 7.2  NEUTROABS 11.3*  --  70.3* 7.3 DUPLICATE SEE [id number]  --   HGB 11.1* 10.2* 9.5* 9.0* PENDING 8.5*  HCT 34.7* 31.6* 29.4* 27.6* PENDING 26.8*  MCV 96.4 96.0  94.5 96.5 PENDING 97.8  PLT 190 175 184 185 PENDING 096   Basic Metabolic Panel: Recent Labs  Lab 05/30/17 1837 05/31/17 0613 05/31/17 1216 06/01/17 0350 06/02/17 0549  NA 136 135  --  138 138  K 4.2 4.0  --  4.1 4.0  CL 102 102  --  101 104  CO2 21* 24  --  26 25  GLUCOSE 136* 114*  --  136* 106*  BUN 15 13  --  14 17  CREATININE 0.81 0.71 0.76 0.87 0.84   CALCIUM 8.9 8.6*  --  9.2 8.6*   GFR: Estimated Creatinine Clearance: 36.9 mL/min (by C-G formula based on SCr of 0.84 mg/dL). Liver Function Tests: Recent Labs  Lab 05/30/17 1837 05/31/17 0613  AST 46*  --   ALT 12*  --   ALKPHOS 70  --   BILITOT 0.6  --   PROT 6.7  --   ALBUMIN 3.6 3.2*   No results for input(s): LIPASE, AMYLASE in the last 168 hours. No results for input(s): AMMONIA in the last 168 hours. Coagulation Profile: Recent Labs  Lab 05/30/17 1837  INR 1.01   Cardiac Enzymes: No results for input(s): CKTOTAL, CKMB, CKMBINDEX, TROPONINI in the last 168 hours. BNP (last 3 results) No results for input(s): PROBNP in the last 8760 hours. HbA1C: No results for input(s): HGBA1C in the last 72 hours. CBG: No results for input(s): GLUCAP in the last 168 hours. Lipid Profile: No results for input(s): CHOL, HDL, LDLCALC, TRIG, CHOLHDL, LDLDIRECT in the last 72 hours. Thyroid Function Tests: No results for input(s): TSH, T4TOTAL, FREET4, T3FREE, THYROIDAB in the last 72 hours. Anemia Panel: No results for input(s): VITAMINB12, FOLATE, FERRITIN, TIBC, IRON, RETICCTPCT in the last 72 hours. Sepsis Labs: No results for input(s): PROCALCITON, LATICACIDVEN in the last 168 hours.  Recent Results (from the past 240 hour(s))  Surgical pcr screen     Status: None   Collection Time: 05/30/17 11:38 PM  Result Value Ref Range Status   MRSA, PCR NEGATIVE NEGATIVE Final   Staphylococcus aureus NEGATIVE NEGATIVE Final    Comment: (NOTE) The Xpert SA Assay (FDA approved for NASAL specimens in patients 49 years of age and older), is one component of a comprehensive surveillance program. It is not intended to diagnose infection nor to guide or monitor treatment. Performed at [facility], [place] [address] [place], [place] [address]   Culture, Urine     Status: None   Collection Time: 06/01/17  3:16 PM  Result Value Ref Range Status   Specimen Description URINE, CLEAN  CATCH  Final   Special Requests NONE  Final   Culture   Final    NO GROWTH Performed at [facility], [address], [place], [place] [address]    Report Status 06/02/2017 FINAL  Final         Radiology Studies: No results found.      Scheduled Meds: . celecoxib  100 mg Oral Daily  . enoxaparin (LOVENOX) injection  40 mg Subcutaneous Q24H  . metoprolol succinate  12.5 mg Oral Daily  . polyethylene glycol  17 g Oral Daily   Continuous Infusions: . lactated ringers 10 mL/hr at 06/01/17 0639  . methocarbamol (ROBAXIN)  IV       LOS: 4 days       Georgette Shell, MD [facility]  If 7PM-7AM, please contact night-coverage [email or web address] Password [facility] 06/03/2017, 9:03 AM

## 2017-06-03 NOTE — Clinical Social Work Placement (Signed)
   CLINICAL SOCIAL WORK PLACEMENT  NOTE  Date:  06/03/2017  Patient Details  Name: Kimberly Ramirez MRN: [id number] Date of Birth: 13-Oct-1927  Clinical Social Work is seeking post-discharge placement for this patient at the [place] level of care (*CSW will initial, date and re-position this form in  chart as items are completed):  Yes   Patient/family provided with [place] Work Department's list of facilities offering this level of care within the geographic area requested by the patient (or if unable, by the patient's family).  Yes   Patient/family informed of their freedom to choose among providers that offer the needed level of care, that participate in Medicare, Medicaid or managed care program needed by the patient, have an available bed and are willing to accept the patient.  Yes   Patient/family informed of [facility]'s ownership interest in [facility] and [facility], as well as of the fact that they are under no obligation to receive care at these facilities.  PASRR submitted to EDS on       PASRR number received on 06/01/17     Existing PASRR number confirmed on       FL2 transmitted to all facilities in geographic area requested by pt/family on 06/01/17     FL2 transmitted to all facilities within larger geographic area on       Patient informed that his/her managed care company has contracts with or will negotiate with certain facilities, including the following:        Yes   Patient/family informed of bed offers received.  Patient chooses bed at [facility]     Physician recommends and patient chooses bed at      Patient to be transferred to [facility] on  .  Patient to be transferred to facility by [facility]     Patient family notified on 06/03/17 of transfer.  Name of family member notified:  daughter at bedside     PHYSICIAN       Additional Comment:    _______________________________________________ Normajean Baxter, LCSW 06/03/2017, 9:52 AM

## 2017-06-03 NOTE — Social Work (Addendum)
Clinical Social Worker facilitated patient discharge including contacting patient family and facility to confirm patient discharge plans.  Clinical information faxed to facility and family agreeable with plan.    CSW arranged ambulance transport via [facility] to [facility] at 1:00pm.    RN to call [phone] to give report prior to discharge.  Clinical Social Worker will sign off for now as social work intervention is no longer needed. Please consult [place] again if new need arises.  Elissa Hefty, LCSW Clinical Social Worker [phone]

## 2017-06-03 NOTE — Progress Notes (Signed)
Physical Therapy Treatment Patient Details Name: Kimberly Ramirez MRN: [id number] DOB: 01/18/1928 Today's Date: 06/03/2017    History of Present Illness 84 yo female with onset of mechanical fall resulting in a R hip fracture with IM nailing 2/17, after comminuted intertrochanteric fem neck fracture.  PMHx:  cervical spondylosis, C4-5 central disc protrusion, cervical stenosis, I gait with no AD    PT Comments    Pt continues to require two person strong assist to stand and transfer to the recliner chair.  She is only able to pivot on her left foot and not take steps (we tried and she could not).  Exercises were progressed and although guarded, she tolerated those well.  She remains appropriate for SNF level rehab at discharge.   Follow Up Recommendations  SNF     Equipment Recommendations  Rolling walker with 5" wheels    Recommendations for Other Services   NA     Precautions / Restrictions Precautions Precautions: Fall Restrictions Weight Bearing Restrictions: Yes RLE Weight Bearing: Weight bearing as tolerated    Mobility  Bed Mobility Overal bed mobility: Needs Assistance Bed Mobility: Supine to Sit     Supine to sit: Mod assist;HOB elevated     General bed mobility comments: Mod assist to support trunk and help scoot hips to EOB. Separately, we moved bil legs towards EOB.  Pt is guarded and pushign posteriorly throughout transition.   Transfers Overall transfer level: Needs assistance Equipment used: Rolling walker (2 wheeled);1 person hand held assist Transfers: Sit to/from [facility] Sit to Stand: +2 physical assistance;Mod assist;From elevated surface Stand pivot transfers: +2 physical assistance;Mod assist;From elevated surface       General transfer comment: Two person mod assist to stand from elevated bed to RW and take some slow pivotal steps to her right to the recliner chair.  She really did not step as much as pivot on her left foot.     Ambulation/Gait             General Gait Details: unable at this time.           Balance Overall balance assessment: Needs assistance Sitting-balance support: Bilateral upper extremity supported Sitting balance-Leahy Scale: Poor Sitting balance - Comments: min assist EOB due to posterior preference Postural control: Posterior lean Standing balance support: Bilateral upper extremity supported Standing balance-Leahy Scale: Poor Standing balance comment: two person mod assist in standing with RW                            Cognition Arousal/Alertness: Awake/alert Behavior During Therapy: WFL for tasks assessed/performed Overall Cognitive Status: Impaired/Different from baseline Area of Impairment: Safety/judgement;Awareness;Memory;Following commands;Problem solving                   Current Attention Level: Selective Memory: Decreased short-term memory;Decreased recall of precautions Following Commands: Follows one step commands consistently Safety/Judgement: Decreased awareness of safety;Decreased awareness of deficits Awareness: Emergent Problem Solving: Difficulty sequencing;Requires verbal cues;Requires tactile cues General Comments: Pt with STM deficits, needs cues for sequencing of movement.  Not sure if she has some cognitive deficits at baseline?      Exercises Total Joint Exercises Heel Slides: AAROM;Right;10 reps Hip ABduction/ADduction: AAROM;Right;10 reps Long Arc Quad: AROM;Right;Left;10 reps        Pertinent Vitals/Pain Pain Assessment: Faces Faces Pain Scale: Hurts whole lot Pain Location: R hip with movement Pain Descriptors / Indicators: Operative site guarding;Tender Pain Intervention(s): Limited activity  within patient's tolerance;Monitored during session;Repositioned        PT Goals (current goals can now be found in the care plan section) Acute Rehab PT Goals Patient Stated Goal: to get back home to read ( being there  at rehab will probably keep me from my reading) Progress towards PT goals: Progressing toward goals    Frequency    Min 3X/week      PT Plan Frequency needs to be updated       AM-PAC PT "6 Clicks" Daily Activity  Outcome Measure  Difficulty turning over in bed (including adjusting bedclothes, sheets and blankets)?: Unable Difficulty moving from lying on back to sitting on the side of the bed? : Unable Difficulty sitting down on and standing up from a chair with arms (e.g., wheelchair, bedside commode, etc,.)?: Unable Help needed moving to and from a bed to chair (including a wheelchair)?: A Lot Help needed walking in hospital room?: Total Help needed climbing 3-5 steps with a railing? : Total 6 Click Score: 7    End of Session Equipment Utilized During Treatment: Gait belt Activity Tolerance: Patient limited by pain Patient left: in chair;with call bell/phone within reach;with chair alarm set;with family/visitor present   PT Visit Diagnosis: Unsteadiness on feet (R26.81);Muscle weakness (generalized) (M62.81);History of falling (Z91.81);Difficulty in walking, not elsewhere classified (R26.2);Pain Pain - Right/Left: Right Pain - part of body: Hip     Time: 0165-5374 PT Time Calculation (min) (ACUTE ONLY): 29 min  Charges:  $Therapeutic Exercise: 8-22 mins $Therapeutic Activity: 8-22 mins          Tierra Divelbiss B. Rya Rausch, PT, DPT [phone]           06/03/2017, 11:25 AM

## 2017-06-09 DIAGNOSIS — S72144D Nondisplaced intertrochanteric fracture of right femur, subsequent encounter for closed fracture with routine healing: Secondary | ICD-10-CM | POA: Diagnosis not present

## 2017-06-15 DIAGNOSIS — D72829 Elevated white blood cell count, unspecified: Secondary | ICD-10-CM | POA: Diagnosis not present

## 2017-06-15 DIAGNOSIS — M62838 Other muscle spasm: Secondary | ICD-10-CM | POA: Diagnosis not present

## 2017-06-15 DIAGNOSIS — S72009D Fracture of unspecified part of neck of unspecified femur, subsequent encounter for closed fracture with routine healing: Secondary | ICD-10-CM | POA: Diagnosis not present

## 2017-06-15 DIAGNOSIS — M6281 Muscle weakness (generalized): Secondary | ICD-10-CM | POA: Diagnosis not present

## 2017-06-15 DIAGNOSIS — R52 Pain, unspecified: Secondary | ICD-10-CM | POA: Diagnosis not present

## 2017-06-15 DIAGNOSIS — I10 Essential (primary) hypertension: Secondary | ICD-10-CM | POA: Diagnosis not present

## 2017-06-15 DIAGNOSIS — E785 Hyperlipidemia, unspecified: Secondary | ICD-10-CM | POA: Diagnosis not present

## 2017-06-15 DIAGNOSIS — L292 Pruritus vulvae: Secondary | ICD-10-CM | POA: Diagnosis not present

## 2017-06-15 DIAGNOSIS — K5900 Constipation, unspecified: Secondary | ICD-10-CM | POA: Diagnosis not present

## 2017-06-17 DIAGNOSIS — M6281 Muscle weakness (generalized): Secondary | ICD-10-CM | POA: Diagnosis not present

## 2017-06-17 DIAGNOSIS — Z9181 History of falling: Secondary | ICD-10-CM | POA: Diagnosis not present

## 2017-06-17 DIAGNOSIS — R2681 Unsteadiness on feet: Secondary | ICD-10-CM | POA: Diagnosis not present

## 2017-06-17 DIAGNOSIS — M25551 Pain in right hip: Secondary | ICD-10-CM | POA: Diagnosis not present

## 2017-06-17 DIAGNOSIS — R278 Other lack of coordination: Secondary | ICD-10-CM | POA: Diagnosis not present

## 2017-06-18 DIAGNOSIS — M6281 Muscle weakness (generalized): Secondary | ICD-10-CM | POA: Diagnosis not present

## 2017-06-18 DIAGNOSIS — M25551 Pain in right hip: Secondary | ICD-10-CM | POA: Diagnosis not present

## 2017-06-18 DIAGNOSIS — Z9181 History of falling: Secondary | ICD-10-CM | POA: Diagnosis not present

## 2017-06-18 DIAGNOSIS — R278 Other lack of coordination: Secondary | ICD-10-CM | POA: Diagnosis not present

## 2017-06-18 DIAGNOSIS — R2681 Unsteadiness on feet: Secondary | ICD-10-CM | POA: Diagnosis not present

## 2017-06-19 DIAGNOSIS — M6281 Muscle weakness (generalized): Secondary | ICD-10-CM | POA: Diagnosis not present

## 2017-06-19 DIAGNOSIS — R2681 Unsteadiness on feet: Secondary | ICD-10-CM | POA: Diagnosis not present

## 2017-06-19 DIAGNOSIS — R278 Other lack of coordination: Secondary | ICD-10-CM | POA: Diagnosis not present

## 2017-06-19 DIAGNOSIS — M25551 Pain in right hip: Secondary | ICD-10-CM | POA: Diagnosis not present

## 2017-06-19 DIAGNOSIS — Z9181 History of falling: Secondary | ICD-10-CM | POA: Diagnosis not present

## 2017-06-22 DIAGNOSIS — Z9181 History of falling: Secondary | ICD-10-CM | POA: Diagnosis not present

## 2017-06-22 DIAGNOSIS — R278 Other lack of coordination: Secondary | ICD-10-CM | POA: Diagnosis not present

## 2017-06-22 DIAGNOSIS — M25551 Pain in right hip: Secondary | ICD-10-CM | POA: Diagnosis not present

## 2017-06-22 DIAGNOSIS — M6281 Muscle weakness (generalized): Secondary | ICD-10-CM | POA: Diagnosis not present

## 2017-06-22 DIAGNOSIS — R2681 Unsteadiness on feet: Secondary | ICD-10-CM | POA: Diagnosis not present

## 2017-06-23 DIAGNOSIS — M25551 Pain in right hip: Secondary | ICD-10-CM | POA: Diagnosis not present

## 2017-06-23 DIAGNOSIS — R278 Other lack of coordination: Secondary | ICD-10-CM | POA: Diagnosis not present

## 2017-06-23 DIAGNOSIS — Z9181 History of falling: Secondary | ICD-10-CM | POA: Diagnosis not present

## 2017-06-23 DIAGNOSIS — R2681 Unsteadiness on feet: Secondary | ICD-10-CM | POA: Diagnosis not present

## 2017-06-23 DIAGNOSIS — M6281 Muscle weakness (generalized): Secondary | ICD-10-CM | POA: Diagnosis not present

## 2017-06-24 DIAGNOSIS — M25551 Pain in right hip: Secondary | ICD-10-CM | POA: Diagnosis not present

## 2017-06-24 DIAGNOSIS — M6281 Muscle weakness (generalized): Secondary | ICD-10-CM | POA: Diagnosis not present

## 2017-06-24 DIAGNOSIS — R278 Other lack of coordination: Secondary | ICD-10-CM | POA: Diagnosis not present

## 2017-06-24 DIAGNOSIS — R2681 Unsteadiness on feet: Secondary | ICD-10-CM | POA: Diagnosis not present

## 2017-06-24 DIAGNOSIS — Z9181 History of falling: Secondary | ICD-10-CM | POA: Diagnosis not present

## 2017-06-25 DIAGNOSIS — M25551 Pain in right hip: Secondary | ICD-10-CM | POA: Diagnosis not present

## 2017-06-25 DIAGNOSIS — M6281 Muscle weakness (generalized): Secondary | ICD-10-CM | POA: Diagnosis not present

## 2017-06-25 DIAGNOSIS — Z9181 History of falling: Secondary | ICD-10-CM | POA: Diagnosis not present

## 2017-06-25 DIAGNOSIS — R278 Other lack of coordination: Secondary | ICD-10-CM | POA: Diagnosis not present

## 2017-06-25 DIAGNOSIS — R2681 Unsteadiness on feet: Secondary | ICD-10-CM | POA: Diagnosis not present

## 2017-06-26 DIAGNOSIS — E7800 Pure hypercholesterolemia, unspecified: Secondary | ICD-10-CM | POA: Diagnosis not present

## 2017-06-26 DIAGNOSIS — M25551 Pain in right hip: Secondary | ICD-10-CM | POA: Diagnosis not present

## 2017-06-26 DIAGNOSIS — D649 Anemia, unspecified: Secondary | ICD-10-CM | POA: Diagnosis not present

## 2017-06-26 DIAGNOSIS — M6281 Muscle weakness (generalized): Secondary | ICD-10-CM | POA: Diagnosis not present

## 2017-06-26 DIAGNOSIS — I10 Essential (primary) hypertension: Secondary | ICD-10-CM | POA: Diagnosis not present

## 2017-06-26 DIAGNOSIS — R278 Other lack of coordination: Secondary | ICD-10-CM | POA: Diagnosis not present

## 2017-06-26 DIAGNOSIS — Z9181 History of falling: Secondary | ICD-10-CM | POA: Diagnosis not present

## 2017-06-26 DIAGNOSIS — R2681 Unsteadiness on feet: Secondary | ICD-10-CM | POA: Diagnosis not present

## 2017-06-29 DIAGNOSIS — M25551 Pain in right hip: Secondary | ICD-10-CM | POA: Diagnosis not present

## 2017-06-29 DIAGNOSIS — R2681 Unsteadiness on feet: Secondary | ICD-10-CM | POA: Diagnosis not present

## 2017-06-29 DIAGNOSIS — M6281 Muscle weakness (generalized): Secondary | ICD-10-CM | POA: Diagnosis not present

## 2017-06-29 DIAGNOSIS — R278 Other lack of coordination: Secondary | ICD-10-CM | POA: Diagnosis not present

## 2017-06-29 DIAGNOSIS — Z9181 History of falling: Secondary | ICD-10-CM | POA: Diagnosis not present

## 2017-06-30 DIAGNOSIS — R2681 Unsteadiness on feet: Secondary | ICD-10-CM | POA: Diagnosis not present

## 2017-06-30 DIAGNOSIS — M6281 Muscle weakness (generalized): Secondary | ICD-10-CM | POA: Diagnosis not present

## 2017-06-30 DIAGNOSIS — Z9181 History of falling: Secondary | ICD-10-CM | POA: Diagnosis not present

## 2017-06-30 DIAGNOSIS — M25551 Pain in right hip: Secondary | ICD-10-CM | POA: Diagnosis not present

## 2017-06-30 DIAGNOSIS — R278 Other lack of coordination: Secondary | ICD-10-CM | POA: Diagnosis not present

## 2017-07-01 ENCOUNTER — Encounter: Payer: Self-pay | Admitting: Podiatry

## 2017-07-01 ENCOUNTER — Ambulatory Visit (INDEPENDENT_AMBULATORY_CARE_PROVIDER_SITE_OTHER): Payer: Medicare Other | Admitting: Podiatry

## 2017-07-01 DIAGNOSIS — M25551 Pain in right hip: Secondary | ICD-10-CM | POA: Diagnosis not present

## 2017-07-01 DIAGNOSIS — B351 Tinea unguium: Secondary | ICD-10-CM

## 2017-07-01 DIAGNOSIS — R2681 Unsteadiness on feet: Secondary | ICD-10-CM | POA: Diagnosis not present

## 2017-07-01 DIAGNOSIS — M79676 Pain in unspecified toe(s): Secondary | ICD-10-CM | POA: Diagnosis not present

## 2017-07-01 DIAGNOSIS — M6281 Muscle weakness (generalized): Secondary | ICD-10-CM | POA: Diagnosis not present

## 2017-07-01 DIAGNOSIS — Z9181 History of falling: Secondary | ICD-10-CM | POA: Diagnosis not present

## 2017-07-01 DIAGNOSIS — R278 Other lack of coordination: Secondary | ICD-10-CM | POA: Diagnosis not present

## 2017-07-01 NOTE — Progress Notes (Addendum)
[name], female   DOB: 1928-04-04, 84 y.o.   MRN: [id number] Complaint:  Visit Type: Patient returns to my office for continued preventative foot care services. Complaint: Patient states" my nails have grown long and thick and become painful to walk and wear shoes" . She presents for preventative foot care services. No changes to ROS.  Patient has had injury to left big toe.  All healed.  Podiatric Exam: Vascular: dorsalis pedis and posterior tibial pulses are palpable bilateral. Capillary return is immediate. Temperature gradient is WNL. Skin turgor WNL  Sensorium: Normal Semmes Weinstein monofilament test. Normal tactile sensation bilaterally. Nail Exam: Pt has thick disfigured discolored nails with subungual debris noted bilateral entire nail hallux through fifth toenails Ulcer Exam: There is no evidence of ulcer or pre-ulcerative changes or infection. Orthopedic Exam: Muscle tone and strength are WNL. No limitations in general ROM. No crepitus or effusions noted. HAV 1st MPJ  Right foot.. Skin: No Porokeratosis. No infection or ulcers.  Heloma durum 5th toe B/L  Diagnosis:  Tinea unguium, Pain in right toe, pain in left toe,    Treatment & Plan Procedures and Treatment: Consent by patient was obtained for treatment procedures. The patient understood the discussion of treatment and procedures well. All questions were answered thoroughly reviewed. Debridement of mycotic and hypertrophic toenails, 1 through 5 bilateral and clearing of subungual debris. No ulceration, no infection noted.  ABN signed for 2019. Return Visit-Office Procedure: Patient instructed to return to the office for a follow up visit 3 months for continued evaluation and treatment.   Gardiner Barefoot DPM

## 2017-07-02 DIAGNOSIS — R2681 Unsteadiness on feet: Secondary | ICD-10-CM | POA: Diagnosis not present

## 2017-07-02 DIAGNOSIS — R278 Other lack of coordination: Secondary | ICD-10-CM | POA: Diagnosis not present

## 2017-07-02 DIAGNOSIS — M25551 Pain in right hip: Secondary | ICD-10-CM | POA: Diagnosis not present

## 2017-07-02 DIAGNOSIS — Z9181 History of falling: Secondary | ICD-10-CM | POA: Diagnosis not present

## 2017-07-02 DIAGNOSIS — M6281 Muscle weakness (generalized): Secondary | ICD-10-CM | POA: Diagnosis not present

## 2017-07-03 DIAGNOSIS — Z9181 History of falling: Secondary | ICD-10-CM | POA: Diagnosis not present

## 2017-07-03 DIAGNOSIS — M6281 Muscle weakness (generalized): Secondary | ICD-10-CM | POA: Diagnosis not present

## 2017-07-03 DIAGNOSIS — R278 Other lack of coordination: Secondary | ICD-10-CM | POA: Diagnosis not present

## 2017-07-03 DIAGNOSIS — M25551 Pain in right hip: Secondary | ICD-10-CM | POA: Diagnosis not present

## 2017-07-03 DIAGNOSIS — R2681 Unsteadiness on feet: Secondary | ICD-10-CM | POA: Diagnosis not present

## 2017-07-06 DIAGNOSIS — R278 Other lack of coordination: Secondary | ICD-10-CM | POA: Diagnosis not present

## 2017-07-06 DIAGNOSIS — M6281 Muscle weakness (generalized): Secondary | ICD-10-CM | POA: Diagnosis not present

## 2017-07-06 DIAGNOSIS — M25551 Pain in right hip: Secondary | ICD-10-CM | POA: Diagnosis not present

## 2017-07-06 DIAGNOSIS — Z9181 History of falling: Secondary | ICD-10-CM | POA: Diagnosis not present

## 2017-07-06 DIAGNOSIS — R2681 Unsteadiness on feet: Secondary | ICD-10-CM | POA: Diagnosis not present

## 2017-07-07 DIAGNOSIS — R278 Other lack of coordination: Secondary | ICD-10-CM | POA: Diagnosis not present

## 2017-07-07 DIAGNOSIS — S72144D Nondisplaced intertrochanteric fracture of right femur, subsequent encounter for closed fracture with routine healing: Secondary | ICD-10-CM | POA: Diagnosis not present

## 2017-07-07 DIAGNOSIS — R2681 Unsteadiness on feet: Secondary | ICD-10-CM | POA: Diagnosis not present

## 2017-07-07 DIAGNOSIS — M6281 Muscle weakness (generalized): Secondary | ICD-10-CM | POA: Diagnosis not present

## 2017-07-07 DIAGNOSIS — M25551 Pain in right hip: Secondary | ICD-10-CM | POA: Diagnosis not present

## 2017-07-07 DIAGNOSIS — Z9181 History of falling: Secondary | ICD-10-CM | POA: Diagnosis not present

## 2017-07-08 DIAGNOSIS — R278 Other lack of coordination: Secondary | ICD-10-CM | POA: Diagnosis not present

## 2017-07-08 DIAGNOSIS — M25551 Pain in right hip: Secondary | ICD-10-CM | POA: Diagnosis not present

## 2017-07-08 DIAGNOSIS — R2681 Unsteadiness on feet: Secondary | ICD-10-CM | POA: Diagnosis not present

## 2017-07-08 DIAGNOSIS — M6281 Muscle weakness (generalized): Secondary | ICD-10-CM | POA: Diagnosis not present

## 2017-07-08 DIAGNOSIS — Z9181 History of falling: Secondary | ICD-10-CM | POA: Diagnosis not present

## 2017-07-09 DIAGNOSIS — R278 Other lack of coordination: Secondary | ICD-10-CM | POA: Diagnosis not present

## 2017-07-09 DIAGNOSIS — M25551 Pain in right hip: Secondary | ICD-10-CM | POA: Diagnosis not present

## 2017-07-09 DIAGNOSIS — M6281 Muscle weakness (generalized): Secondary | ICD-10-CM | POA: Diagnosis not present

## 2017-07-09 DIAGNOSIS — R2681 Unsteadiness on feet: Secondary | ICD-10-CM | POA: Diagnosis not present

## 2017-07-09 DIAGNOSIS — Z9181 History of falling: Secondary | ICD-10-CM | POA: Diagnosis not present

## 2017-07-13 DIAGNOSIS — M25551 Pain in right hip: Secondary | ICD-10-CM | POA: Diagnosis not present

## 2017-07-13 DIAGNOSIS — R2681 Unsteadiness on feet: Secondary | ICD-10-CM | POA: Diagnosis not present

## 2017-07-13 DIAGNOSIS — R278 Other lack of coordination: Secondary | ICD-10-CM | POA: Diagnosis not present

## 2017-07-13 DIAGNOSIS — R41841 Cognitive communication deficit: Secondary | ICD-10-CM | POA: Diagnosis not present

## 2017-07-13 DIAGNOSIS — Z9181 History of falling: Secondary | ICD-10-CM | POA: Diagnosis not present

## 2017-07-13 DIAGNOSIS — M6281 Muscle weakness (generalized): Secondary | ICD-10-CM | POA: Diagnosis not present

## 2017-07-14 DIAGNOSIS — R41841 Cognitive communication deficit: Secondary | ICD-10-CM | POA: Diagnosis not present

## 2017-07-14 DIAGNOSIS — M25551 Pain in right hip: Secondary | ICD-10-CM | POA: Diagnosis not present

## 2017-07-14 DIAGNOSIS — M6281 Muscle weakness (generalized): Secondary | ICD-10-CM | POA: Diagnosis not present

## 2017-07-14 DIAGNOSIS — R278 Other lack of coordination: Secondary | ICD-10-CM | POA: Diagnosis not present

## 2017-07-14 DIAGNOSIS — Z9181 History of falling: Secondary | ICD-10-CM | POA: Diagnosis not present

## 2017-07-14 DIAGNOSIS — R2681 Unsteadiness on feet: Secondary | ICD-10-CM | POA: Diagnosis not present

## 2017-07-16 DIAGNOSIS — R2681 Unsteadiness on feet: Secondary | ICD-10-CM | POA: Diagnosis not present

## 2017-07-16 DIAGNOSIS — Z9181 History of falling: Secondary | ICD-10-CM | POA: Diagnosis not present

## 2017-07-16 DIAGNOSIS — R41841 Cognitive communication deficit: Secondary | ICD-10-CM | POA: Diagnosis not present

## 2017-07-16 DIAGNOSIS — M6281 Muscle weakness (generalized): Secondary | ICD-10-CM | POA: Diagnosis not present

## 2017-07-16 DIAGNOSIS — R278 Other lack of coordination: Secondary | ICD-10-CM | POA: Diagnosis not present

## 2017-07-16 DIAGNOSIS — M25551 Pain in right hip: Secondary | ICD-10-CM | POA: Diagnosis not present

## 2017-07-17 DIAGNOSIS — M6281 Muscle weakness (generalized): Secondary | ICD-10-CM | POA: Diagnosis not present

## 2017-07-17 DIAGNOSIS — R278 Other lack of coordination: Secondary | ICD-10-CM | POA: Diagnosis not present

## 2017-07-17 DIAGNOSIS — R41841 Cognitive communication deficit: Secondary | ICD-10-CM | POA: Diagnosis not present

## 2017-07-17 DIAGNOSIS — R2681 Unsteadiness on feet: Secondary | ICD-10-CM | POA: Diagnosis not present

## 2017-07-17 DIAGNOSIS — Z9181 History of falling: Secondary | ICD-10-CM | POA: Diagnosis not present

## 2017-07-17 DIAGNOSIS — M25551 Pain in right hip: Secondary | ICD-10-CM | POA: Diagnosis not present

## 2017-07-20 DIAGNOSIS — M25551 Pain in right hip: Secondary | ICD-10-CM | POA: Diagnosis not present

## 2017-07-20 DIAGNOSIS — R2681 Unsteadiness on feet: Secondary | ICD-10-CM | POA: Diagnosis not present

## 2017-07-20 DIAGNOSIS — R278 Other lack of coordination: Secondary | ICD-10-CM | POA: Diagnosis not present

## 2017-07-20 DIAGNOSIS — R41841 Cognitive communication deficit: Secondary | ICD-10-CM | POA: Diagnosis not present

## 2017-07-20 DIAGNOSIS — Z9181 History of falling: Secondary | ICD-10-CM | POA: Diagnosis not present

## 2017-07-20 DIAGNOSIS — M6281 Muscle weakness (generalized): Secondary | ICD-10-CM | POA: Diagnosis not present

## 2017-07-21 DIAGNOSIS — M6281 Muscle weakness (generalized): Secondary | ICD-10-CM | POA: Diagnosis not present

## 2017-07-21 DIAGNOSIS — M25551 Pain in right hip: Secondary | ICD-10-CM | POA: Diagnosis not present

## 2017-07-21 DIAGNOSIS — R2681 Unsteadiness on feet: Secondary | ICD-10-CM | POA: Diagnosis not present

## 2017-07-21 DIAGNOSIS — R278 Other lack of coordination: Secondary | ICD-10-CM | POA: Diagnosis not present

## 2017-07-21 DIAGNOSIS — Z9181 History of falling: Secondary | ICD-10-CM | POA: Diagnosis not present

## 2017-07-21 DIAGNOSIS — R41841 Cognitive communication deficit: Secondary | ICD-10-CM | POA: Diagnosis not present

## 2017-07-22 DIAGNOSIS — R278 Other lack of coordination: Secondary | ICD-10-CM | POA: Diagnosis not present

## 2017-07-22 DIAGNOSIS — M6281 Muscle weakness (generalized): Secondary | ICD-10-CM | POA: Diagnosis not present

## 2017-07-22 DIAGNOSIS — R2681 Unsteadiness on feet: Secondary | ICD-10-CM | POA: Diagnosis not present

## 2017-07-22 DIAGNOSIS — R41841 Cognitive communication deficit: Secondary | ICD-10-CM | POA: Diagnosis not present

## 2017-07-22 DIAGNOSIS — Z9181 History of falling: Secondary | ICD-10-CM | POA: Diagnosis not present

## 2017-07-22 DIAGNOSIS — M25551 Pain in right hip: Secondary | ICD-10-CM | POA: Diagnosis not present

## 2017-07-23 DIAGNOSIS — R278 Other lack of coordination: Secondary | ICD-10-CM | POA: Diagnosis not present

## 2017-07-23 DIAGNOSIS — G301 Alzheimer's disease with late onset: Secondary | ICD-10-CM | POA: Diagnosis not present

## 2017-07-23 DIAGNOSIS — R41841 Cognitive communication deficit: Secondary | ICD-10-CM | POA: Diagnosis not present

## 2017-07-23 DIAGNOSIS — M25551 Pain in right hip: Secondary | ICD-10-CM | POA: Diagnosis not present

## 2017-07-23 DIAGNOSIS — Z9181 History of falling: Secondary | ICD-10-CM | POA: Diagnosis not present

## 2017-07-23 DIAGNOSIS — Z79899 Other long term (current) drug therapy: Secondary | ICD-10-CM | POA: Diagnosis not present

## 2017-07-23 DIAGNOSIS — M6281 Muscle weakness (generalized): Secondary | ICD-10-CM | POA: Diagnosis not present

## 2017-07-23 DIAGNOSIS — R2681 Unsteadiness on feet: Secondary | ICD-10-CM | POA: Diagnosis not present

## 2017-07-23 DIAGNOSIS — E7800 Pure hypercholesterolemia, unspecified: Secondary | ICD-10-CM | POA: Diagnosis not present

## 2017-07-23 DIAGNOSIS — F0280 Dementia in other diseases classified elsewhere without behavioral disturbance: Secondary | ICD-10-CM | POA: Diagnosis not present

## 2017-07-23 DIAGNOSIS — D649 Anemia, unspecified: Secondary | ICD-10-CM | POA: Diagnosis not present

## 2017-07-24 DIAGNOSIS — R41841 Cognitive communication deficit: Secondary | ICD-10-CM | POA: Diagnosis not present

## 2017-07-24 DIAGNOSIS — M6281 Muscle weakness (generalized): Secondary | ICD-10-CM | POA: Diagnosis not present

## 2017-07-24 DIAGNOSIS — M25551 Pain in right hip: Secondary | ICD-10-CM | POA: Diagnosis not present

## 2017-07-24 DIAGNOSIS — Z9181 History of falling: Secondary | ICD-10-CM | POA: Diagnosis not present

## 2017-07-24 DIAGNOSIS — R278 Other lack of coordination: Secondary | ICD-10-CM | POA: Diagnosis not present

## 2017-07-24 DIAGNOSIS — R2681 Unsteadiness on feet: Secondary | ICD-10-CM | POA: Diagnosis not present

## 2017-07-27 DIAGNOSIS — Z79899 Other long term (current) drug therapy: Secondary | ICD-10-CM | POA: Diagnosis not present

## 2017-07-27 DIAGNOSIS — G301 Alzheimer's disease with late onset: Secondary | ICD-10-CM | POA: Diagnosis not present

## 2017-07-27 DIAGNOSIS — R2681 Unsteadiness on feet: Secondary | ICD-10-CM | POA: Diagnosis not present

## 2017-07-27 DIAGNOSIS — M25551 Pain in right hip: Secondary | ICD-10-CM | POA: Diagnosis not present

## 2017-07-27 DIAGNOSIS — R278 Other lack of coordination: Secondary | ICD-10-CM | POA: Diagnosis not present

## 2017-07-27 DIAGNOSIS — M6281 Muscle weakness (generalized): Secondary | ICD-10-CM | POA: Diagnosis not present

## 2017-07-27 DIAGNOSIS — Z9181 History of falling: Secondary | ICD-10-CM | POA: Diagnosis not present

## 2017-07-27 DIAGNOSIS — R41841 Cognitive communication deficit: Secondary | ICD-10-CM | POA: Diagnosis not present

## 2017-07-27 DIAGNOSIS — D649 Anemia, unspecified: Secondary | ICD-10-CM | POA: Diagnosis not present

## 2017-07-28 DIAGNOSIS — R278 Other lack of coordination: Secondary | ICD-10-CM | POA: Diagnosis not present

## 2017-07-28 DIAGNOSIS — M25551 Pain in right hip: Secondary | ICD-10-CM | POA: Diagnosis not present

## 2017-07-28 DIAGNOSIS — R2681 Unsteadiness on feet: Secondary | ICD-10-CM | POA: Diagnosis not present

## 2017-07-28 DIAGNOSIS — Z9181 History of falling: Secondary | ICD-10-CM | POA: Diagnosis not present

## 2017-07-28 DIAGNOSIS — R41841 Cognitive communication deficit: Secondary | ICD-10-CM | POA: Diagnosis not present

## 2017-07-28 DIAGNOSIS — M6281 Muscle weakness (generalized): Secondary | ICD-10-CM | POA: Diagnosis not present

## 2017-07-29 DIAGNOSIS — R2681 Unsteadiness on feet: Secondary | ICD-10-CM | POA: Diagnosis not present

## 2017-07-29 DIAGNOSIS — R41841 Cognitive communication deficit: Secondary | ICD-10-CM | POA: Diagnosis not present

## 2017-07-29 DIAGNOSIS — Z9181 History of falling: Secondary | ICD-10-CM | POA: Diagnosis not present

## 2017-07-29 DIAGNOSIS — M6281 Muscle weakness (generalized): Secondary | ICD-10-CM | POA: Diagnosis not present

## 2017-07-29 DIAGNOSIS — M25551 Pain in right hip: Secondary | ICD-10-CM | POA: Diagnosis not present

## 2017-07-29 DIAGNOSIS — R278 Other lack of coordination: Secondary | ICD-10-CM | POA: Diagnosis not present

## 2017-07-30 DIAGNOSIS — Z9181 History of falling: Secondary | ICD-10-CM | POA: Diagnosis not present

## 2017-07-30 DIAGNOSIS — M25551 Pain in right hip: Secondary | ICD-10-CM | POA: Diagnosis not present

## 2017-07-30 DIAGNOSIS — R278 Other lack of coordination: Secondary | ICD-10-CM | POA: Diagnosis not present

## 2017-07-30 DIAGNOSIS — R41841 Cognitive communication deficit: Secondary | ICD-10-CM | POA: Diagnosis not present

## 2017-07-30 DIAGNOSIS — M6281 Muscle weakness (generalized): Secondary | ICD-10-CM | POA: Diagnosis not present

## 2017-07-30 DIAGNOSIS — R2681 Unsteadiness on feet: Secondary | ICD-10-CM | POA: Diagnosis not present

## 2017-07-31 DIAGNOSIS — M25551 Pain in right hip: Secondary | ICD-10-CM | POA: Diagnosis not present

## 2017-07-31 DIAGNOSIS — M6281 Muscle weakness (generalized): Secondary | ICD-10-CM | POA: Diagnosis not present

## 2017-07-31 DIAGNOSIS — R41841 Cognitive communication deficit: Secondary | ICD-10-CM | POA: Diagnosis not present

## 2017-07-31 DIAGNOSIS — R278 Other lack of coordination: Secondary | ICD-10-CM | POA: Diagnosis not present

## 2017-07-31 DIAGNOSIS — R2681 Unsteadiness on feet: Secondary | ICD-10-CM | POA: Diagnosis not present

## 2017-07-31 DIAGNOSIS — Z9181 History of falling: Secondary | ICD-10-CM | POA: Diagnosis not present

## 2017-08-03 DIAGNOSIS — M6281 Muscle weakness (generalized): Secondary | ICD-10-CM | POA: Diagnosis not present

## 2017-08-03 DIAGNOSIS — R278 Other lack of coordination: Secondary | ICD-10-CM | POA: Diagnosis not present

## 2017-08-03 DIAGNOSIS — Z9181 History of falling: Secondary | ICD-10-CM | POA: Diagnosis not present

## 2017-08-03 DIAGNOSIS — R2681 Unsteadiness on feet: Secondary | ICD-10-CM | POA: Diagnosis not present

## 2017-08-03 DIAGNOSIS — M25551 Pain in right hip: Secondary | ICD-10-CM | POA: Diagnosis not present

## 2017-08-03 DIAGNOSIS — R41841 Cognitive communication deficit: Secondary | ICD-10-CM | POA: Diagnosis not present

## 2017-08-04 DIAGNOSIS — R41841 Cognitive communication deficit: Secondary | ICD-10-CM | POA: Diagnosis not present

## 2017-08-04 DIAGNOSIS — M25551 Pain in right hip: Secondary | ICD-10-CM | POA: Diagnosis not present

## 2017-08-04 DIAGNOSIS — Z9181 History of falling: Secondary | ICD-10-CM | POA: Diagnosis not present

## 2017-08-04 DIAGNOSIS — R278 Other lack of coordination: Secondary | ICD-10-CM | POA: Diagnosis not present

## 2017-08-04 DIAGNOSIS — M6281 Muscle weakness (generalized): Secondary | ICD-10-CM | POA: Diagnosis not present

## 2017-08-04 DIAGNOSIS — R2681 Unsteadiness on feet: Secondary | ICD-10-CM | POA: Diagnosis not present

## 2017-08-05 DIAGNOSIS — R2681 Unsteadiness on feet: Secondary | ICD-10-CM | POA: Diagnosis not present

## 2017-08-05 DIAGNOSIS — M25551 Pain in right hip: Secondary | ICD-10-CM | POA: Diagnosis not present

## 2017-08-05 DIAGNOSIS — M6281 Muscle weakness (generalized): Secondary | ICD-10-CM | POA: Diagnosis not present

## 2017-08-05 DIAGNOSIS — R278 Other lack of coordination: Secondary | ICD-10-CM | POA: Diagnosis not present

## 2017-08-05 DIAGNOSIS — R41841 Cognitive communication deficit: Secondary | ICD-10-CM | POA: Diagnosis not present

## 2017-08-05 DIAGNOSIS — Z9181 History of falling: Secondary | ICD-10-CM | POA: Diagnosis not present

## 2017-08-06 DIAGNOSIS — M25551 Pain in right hip: Secondary | ICD-10-CM | POA: Diagnosis not present

## 2017-08-06 DIAGNOSIS — R2681 Unsteadiness on feet: Secondary | ICD-10-CM | POA: Diagnosis not present

## 2017-08-06 DIAGNOSIS — R41841 Cognitive communication deficit: Secondary | ICD-10-CM | POA: Diagnosis not present

## 2017-08-06 DIAGNOSIS — R278 Other lack of coordination: Secondary | ICD-10-CM | POA: Diagnosis not present

## 2017-08-06 DIAGNOSIS — Z9181 History of falling: Secondary | ICD-10-CM | POA: Diagnosis not present

## 2017-08-06 DIAGNOSIS — M6281 Muscle weakness (generalized): Secondary | ICD-10-CM | POA: Diagnosis not present

## 2017-08-07 DIAGNOSIS — R2681 Unsteadiness on feet: Secondary | ICD-10-CM | POA: Diagnosis not present

## 2017-08-07 DIAGNOSIS — Z9181 History of falling: Secondary | ICD-10-CM | POA: Diagnosis not present

## 2017-08-07 DIAGNOSIS — M6281 Muscle weakness (generalized): Secondary | ICD-10-CM | POA: Diagnosis not present

## 2017-08-07 DIAGNOSIS — R41841 Cognitive communication deficit: Secondary | ICD-10-CM | POA: Diagnosis not present

## 2017-08-07 DIAGNOSIS — M25551 Pain in right hip: Secondary | ICD-10-CM | POA: Diagnosis not present

## 2017-08-07 DIAGNOSIS — R278 Other lack of coordination: Secondary | ICD-10-CM | POA: Diagnosis not present

## 2017-08-10 DIAGNOSIS — R278 Other lack of coordination: Secondary | ICD-10-CM | POA: Diagnosis not present

## 2017-08-10 DIAGNOSIS — M6281 Muscle weakness (generalized): Secondary | ICD-10-CM | POA: Diagnosis not present

## 2017-08-10 DIAGNOSIS — R2681 Unsteadiness on feet: Secondary | ICD-10-CM | POA: Diagnosis not present

## 2017-08-10 DIAGNOSIS — Z9181 History of falling: Secondary | ICD-10-CM | POA: Diagnosis not present

## 2017-08-10 DIAGNOSIS — R41841 Cognitive communication deficit: Secondary | ICD-10-CM | POA: Diagnosis not present

## 2017-08-10 DIAGNOSIS — M25551 Pain in right hip: Secondary | ICD-10-CM | POA: Diagnosis not present

## 2017-08-11 DIAGNOSIS — Z9181 History of falling: Secondary | ICD-10-CM | POA: Diagnosis not present

## 2017-08-11 DIAGNOSIS — R41841 Cognitive communication deficit: Secondary | ICD-10-CM | POA: Diagnosis not present

## 2017-08-11 DIAGNOSIS — M6281 Muscle weakness (generalized): Secondary | ICD-10-CM | POA: Diagnosis not present

## 2017-08-11 DIAGNOSIS — M25551 Pain in right hip: Secondary | ICD-10-CM | POA: Diagnosis not present

## 2017-08-11 DIAGNOSIS — R2681 Unsteadiness on feet: Secondary | ICD-10-CM | POA: Diagnosis not present

## 2017-08-11 DIAGNOSIS — R278 Other lack of coordination: Secondary | ICD-10-CM | POA: Diagnosis not present

## 2017-08-12 DIAGNOSIS — M6281 Muscle weakness (generalized): Secondary | ICD-10-CM | POA: Diagnosis not present

## 2017-08-12 DIAGNOSIS — R41841 Cognitive communication deficit: Secondary | ICD-10-CM | POA: Diagnosis not present

## 2017-08-12 DIAGNOSIS — M25551 Pain in right hip: Secondary | ICD-10-CM | POA: Diagnosis not present

## 2017-08-12 DIAGNOSIS — R2681 Unsteadiness on feet: Secondary | ICD-10-CM | POA: Diagnosis not present

## 2017-08-12 DIAGNOSIS — R278 Other lack of coordination: Secondary | ICD-10-CM | POA: Diagnosis not present

## 2017-08-12 DIAGNOSIS — Z9181 History of falling: Secondary | ICD-10-CM | POA: Diagnosis not present

## 2017-08-13 DIAGNOSIS — R278 Other lack of coordination: Secondary | ICD-10-CM | POA: Diagnosis not present

## 2017-08-13 DIAGNOSIS — M25551 Pain in right hip: Secondary | ICD-10-CM | POA: Diagnosis not present

## 2017-08-13 DIAGNOSIS — Z9181 History of falling: Secondary | ICD-10-CM | POA: Diagnosis not present

## 2017-08-13 DIAGNOSIS — R41841 Cognitive communication deficit: Secondary | ICD-10-CM | POA: Diagnosis not present

## 2017-08-13 DIAGNOSIS — M6281 Muscle weakness (generalized): Secondary | ICD-10-CM | POA: Diagnosis not present

## 2017-08-13 DIAGNOSIS — R2681 Unsteadiness on feet: Secondary | ICD-10-CM | POA: Diagnosis not present

## 2017-08-14 DIAGNOSIS — M25551 Pain in right hip: Secondary | ICD-10-CM | POA: Diagnosis not present

## 2017-08-14 DIAGNOSIS — R41841 Cognitive communication deficit: Secondary | ICD-10-CM | POA: Diagnosis not present

## 2017-08-14 DIAGNOSIS — M6281 Muscle weakness (generalized): Secondary | ICD-10-CM | POA: Diagnosis not present

## 2017-08-14 DIAGNOSIS — Z9181 History of falling: Secondary | ICD-10-CM | POA: Diagnosis not present

## 2017-08-14 DIAGNOSIS — R278 Other lack of coordination: Secondary | ICD-10-CM | POA: Diagnosis not present

## 2017-08-14 DIAGNOSIS — R2681 Unsteadiness on feet: Secondary | ICD-10-CM | POA: Diagnosis not present

## 2017-08-17 DIAGNOSIS — R2681 Unsteadiness on feet: Secondary | ICD-10-CM | POA: Diagnosis not present

## 2017-08-17 DIAGNOSIS — R278 Other lack of coordination: Secondary | ICD-10-CM | POA: Diagnosis not present

## 2017-08-17 DIAGNOSIS — R41841 Cognitive communication deficit: Secondary | ICD-10-CM | POA: Diagnosis not present

## 2017-08-17 DIAGNOSIS — M25551 Pain in right hip: Secondary | ICD-10-CM | POA: Diagnosis not present

## 2017-08-17 DIAGNOSIS — Z9181 History of falling: Secondary | ICD-10-CM | POA: Diagnosis not present

## 2017-08-17 DIAGNOSIS — M6281 Muscle weakness (generalized): Secondary | ICD-10-CM | POA: Diagnosis not present

## 2017-08-18 DIAGNOSIS — R41841 Cognitive communication deficit: Secondary | ICD-10-CM | POA: Diagnosis not present

## 2017-08-18 DIAGNOSIS — Z9181 History of falling: Secondary | ICD-10-CM | POA: Diagnosis not present

## 2017-08-18 DIAGNOSIS — R278 Other lack of coordination: Secondary | ICD-10-CM | POA: Diagnosis not present

## 2017-08-18 DIAGNOSIS — R2681 Unsteadiness on feet: Secondary | ICD-10-CM | POA: Diagnosis not present

## 2017-08-18 DIAGNOSIS — M6281 Muscle weakness (generalized): Secondary | ICD-10-CM | POA: Diagnosis not present

## 2017-08-18 DIAGNOSIS — M25551 Pain in right hip: Secondary | ICD-10-CM | POA: Diagnosis not present

## 2017-08-19 DIAGNOSIS — R41841 Cognitive communication deficit: Secondary | ICD-10-CM | POA: Diagnosis not present

## 2017-08-19 DIAGNOSIS — M6281 Muscle weakness (generalized): Secondary | ICD-10-CM | POA: Diagnosis not present

## 2017-08-19 DIAGNOSIS — M25551 Pain in right hip: Secondary | ICD-10-CM | POA: Diagnosis not present

## 2017-08-19 DIAGNOSIS — R278 Other lack of coordination: Secondary | ICD-10-CM | POA: Diagnosis not present

## 2017-08-19 DIAGNOSIS — R2681 Unsteadiness on feet: Secondary | ICD-10-CM | POA: Diagnosis not present

## 2017-08-19 DIAGNOSIS — Z9181 History of falling: Secondary | ICD-10-CM | POA: Diagnosis not present

## 2017-08-20 DIAGNOSIS — R278 Other lack of coordination: Secondary | ICD-10-CM | POA: Diagnosis not present

## 2017-08-20 DIAGNOSIS — M25551 Pain in right hip: Secondary | ICD-10-CM | POA: Diagnosis not present

## 2017-08-20 DIAGNOSIS — R41841 Cognitive communication deficit: Secondary | ICD-10-CM | POA: Diagnosis not present

## 2017-08-20 DIAGNOSIS — R2681 Unsteadiness on feet: Secondary | ICD-10-CM | POA: Diagnosis not present

## 2017-08-20 DIAGNOSIS — Z9181 History of falling: Secondary | ICD-10-CM | POA: Diagnosis not present

## 2017-08-20 DIAGNOSIS — M6281 Muscle weakness (generalized): Secondary | ICD-10-CM | POA: Diagnosis not present

## 2017-08-21 DIAGNOSIS — R278 Other lack of coordination: Secondary | ICD-10-CM | POA: Diagnosis not present

## 2017-08-21 DIAGNOSIS — R41841 Cognitive communication deficit: Secondary | ICD-10-CM | POA: Diagnosis not present

## 2017-08-21 DIAGNOSIS — Z9181 History of falling: Secondary | ICD-10-CM | POA: Diagnosis not present

## 2017-08-21 DIAGNOSIS — M25551 Pain in right hip: Secondary | ICD-10-CM | POA: Diagnosis not present

## 2017-08-21 DIAGNOSIS — M6281 Muscle weakness (generalized): Secondary | ICD-10-CM | POA: Diagnosis not present

## 2017-08-21 DIAGNOSIS — R2681 Unsteadiness on feet: Secondary | ICD-10-CM | POA: Diagnosis not present

## 2017-08-24 DIAGNOSIS — R41841 Cognitive communication deficit: Secondary | ICD-10-CM | POA: Diagnosis not present

## 2017-08-24 DIAGNOSIS — R278 Other lack of coordination: Secondary | ICD-10-CM | POA: Diagnosis not present

## 2017-08-24 DIAGNOSIS — Z9181 History of falling: Secondary | ICD-10-CM | POA: Diagnosis not present

## 2017-08-24 DIAGNOSIS — M25551 Pain in right hip: Secondary | ICD-10-CM | POA: Diagnosis not present

## 2017-08-24 DIAGNOSIS — M6281 Muscle weakness (generalized): Secondary | ICD-10-CM | POA: Diagnosis not present

## 2017-08-24 DIAGNOSIS — R2681 Unsteadiness on feet: Secondary | ICD-10-CM | POA: Diagnosis not present

## 2017-08-25 DIAGNOSIS — R278 Other lack of coordination: Secondary | ICD-10-CM | POA: Diagnosis not present

## 2017-08-25 DIAGNOSIS — R2681 Unsteadiness on feet: Secondary | ICD-10-CM | POA: Diagnosis not present

## 2017-08-25 DIAGNOSIS — M25551 Pain in right hip: Secondary | ICD-10-CM | POA: Diagnosis not present

## 2017-08-25 DIAGNOSIS — R41841 Cognitive communication deficit: Secondary | ICD-10-CM | POA: Diagnosis not present

## 2017-08-25 DIAGNOSIS — M6281 Muscle weakness (generalized): Secondary | ICD-10-CM | POA: Diagnosis not present

## 2017-08-25 DIAGNOSIS — Z9181 History of falling: Secondary | ICD-10-CM | POA: Diagnosis not present

## 2017-08-26 DIAGNOSIS — H353131 Nonexudative age-related macular degeneration, bilateral, early dry stage: Secondary | ICD-10-CM | POA: Diagnosis not present

## 2017-08-26 DIAGNOSIS — H43813 Vitreous degeneration, bilateral: Secondary | ICD-10-CM | POA: Diagnosis not present

## 2017-08-27 DIAGNOSIS — M25551 Pain in right hip: Secondary | ICD-10-CM | POA: Diagnosis not present

## 2017-08-27 DIAGNOSIS — R278 Other lack of coordination: Secondary | ICD-10-CM | POA: Diagnosis not present

## 2017-08-27 DIAGNOSIS — R2681 Unsteadiness on feet: Secondary | ICD-10-CM | POA: Diagnosis not present

## 2017-08-27 DIAGNOSIS — M6281 Muscle weakness (generalized): Secondary | ICD-10-CM | POA: Diagnosis not present

## 2017-08-27 DIAGNOSIS — Z9181 History of falling: Secondary | ICD-10-CM | POA: Diagnosis not present

## 2017-08-27 DIAGNOSIS — R41841 Cognitive communication deficit: Secondary | ICD-10-CM | POA: Diagnosis not present

## 2017-09-01 DIAGNOSIS — R2681 Unsteadiness on feet: Secondary | ICD-10-CM | POA: Diagnosis not present

## 2017-09-01 DIAGNOSIS — R278 Other lack of coordination: Secondary | ICD-10-CM | POA: Diagnosis not present

## 2017-09-01 DIAGNOSIS — R41841 Cognitive communication deficit: Secondary | ICD-10-CM | POA: Diagnosis not present

## 2017-09-01 DIAGNOSIS — Z9181 History of falling: Secondary | ICD-10-CM | POA: Diagnosis not present

## 2017-09-01 DIAGNOSIS — M6281 Muscle weakness (generalized): Secondary | ICD-10-CM | POA: Diagnosis not present

## 2017-09-01 DIAGNOSIS — M25551 Pain in right hip: Secondary | ICD-10-CM | POA: Diagnosis not present

## 2017-09-03 DIAGNOSIS — Z9181 History of falling: Secondary | ICD-10-CM | POA: Diagnosis not present

## 2017-09-03 DIAGNOSIS — R278 Other lack of coordination: Secondary | ICD-10-CM | POA: Diagnosis not present

## 2017-09-03 DIAGNOSIS — M25551 Pain in right hip: Secondary | ICD-10-CM | POA: Diagnosis not present

## 2017-09-03 DIAGNOSIS — R2681 Unsteadiness on feet: Secondary | ICD-10-CM | POA: Diagnosis not present

## 2017-09-03 DIAGNOSIS — R41841 Cognitive communication deficit: Secondary | ICD-10-CM | POA: Diagnosis not present

## 2017-09-03 DIAGNOSIS — M6281 Muscle weakness (generalized): Secondary | ICD-10-CM | POA: Diagnosis not present

## 2017-09-08 DIAGNOSIS — M6281 Muscle weakness (generalized): Secondary | ICD-10-CM | POA: Diagnosis not present

## 2017-09-08 DIAGNOSIS — Z9181 History of falling: Secondary | ICD-10-CM | POA: Diagnosis not present

## 2017-09-08 DIAGNOSIS — R278 Other lack of coordination: Secondary | ICD-10-CM | POA: Diagnosis not present

## 2017-09-08 DIAGNOSIS — R2681 Unsteadiness on feet: Secondary | ICD-10-CM | POA: Diagnosis not present

## 2017-09-08 DIAGNOSIS — M25551 Pain in right hip: Secondary | ICD-10-CM | POA: Diagnosis not present

## 2017-09-08 DIAGNOSIS — R41841 Cognitive communication deficit: Secondary | ICD-10-CM | POA: Diagnosis not present

## 2017-09-10 DIAGNOSIS — M25551 Pain in right hip: Secondary | ICD-10-CM | POA: Diagnosis not present

## 2017-09-10 DIAGNOSIS — Z9181 History of falling: Secondary | ICD-10-CM | POA: Diagnosis not present

## 2017-09-10 DIAGNOSIS — M6281 Muscle weakness (generalized): Secondary | ICD-10-CM | POA: Diagnosis not present

## 2017-09-10 DIAGNOSIS — R2681 Unsteadiness on feet: Secondary | ICD-10-CM | POA: Diagnosis not present

## 2017-09-15 DIAGNOSIS — M25551 Pain in right hip: Secondary | ICD-10-CM | POA: Diagnosis not present

## 2017-09-15 DIAGNOSIS — Z9181 History of falling: Secondary | ICD-10-CM | POA: Diagnosis not present

## 2017-09-15 DIAGNOSIS — M6281 Muscle weakness (generalized): Secondary | ICD-10-CM | POA: Diagnosis not present

## 2017-09-15 DIAGNOSIS — R2681 Unsteadiness on feet: Secondary | ICD-10-CM | POA: Diagnosis not present

## 2017-09-17 DIAGNOSIS — Z9181 History of falling: Secondary | ICD-10-CM | POA: Diagnosis not present

## 2017-09-17 DIAGNOSIS — M6281 Muscle weakness (generalized): Secondary | ICD-10-CM | POA: Diagnosis not present

## 2017-09-17 DIAGNOSIS — M25551 Pain in right hip: Secondary | ICD-10-CM | POA: Diagnosis not present

## 2017-09-17 DIAGNOSIS — R2681 Unsteadiness on feet: Secondary | ICD-10-CM | POA: Diagnosis not present

## 2017-09-22 DIAGNOSIS — R2681 Unsteadiness on feet: Secondary | ICD-10-CM | POA: Diagnosis not present

## 2017-09-22 DIAGNOSIS — M25551 Pain in right hip: Secondary | ICD-10-CM | POA: Diagnosis not present

## 2017-09-22 DIAGNOSIS — Z9181 History of falling: Secondary | ICD-10-CM | POA: Diagnosis not present

## 2017-09-22 DIAGNOSIS — M6281 Muscle weakness (generalized): Secondary | ICD-10-CM | POA: Diagnosis not present

## 2017-09-24 DIAGNOSIS — R2681 Unsteadiness on feet: Secondary | ICD-10-CM | POA: Diagnosis not present

## 2017-09-24 DIAGNOSIS — M25551 Pain in right hip: Secondary | ICD-10-CM | POA: Diagnosis not present

## 2017-09-24 DIAGNOSIS — Z9181 History of falling: Secondary | ICD-10-CM | POA: Diagnosis not present

## 2017-09-24 DIAGNOSIS — M6281 Muscle weakness (generalized): Secondary | ICD-10-CM | POA: Diagnosis not present

## 2017-09-30 DIAGNOSIS — M25551 Pain in right hip: Secondary | ICD-10-CM | POA: Diagnosis not present

## 2017-09-30 DIAGNOSIS — R2681 Unsteadiness on feet: Secondary | ICD-10-CM | POA: Diagnosis not present

## 2017-09-30 DIAGNOSIS — Z9181 History of falling: Secondary | ICD-10-CM | POA: Diagnosis not present

## 2017-09-30 DIAGNOSIS — M6281 Muscle weakness (generalized): Secondary | ICD-10-CM | POA: Diagnosis not present

## 2017-10-02 DIAGNOSIS — M6281 Muscle weakness (generalized): Secondary | ICD-10-CM | POA: Diagnosis not present

## 2017-10-02 DIAGNOSIS — M25551 Pain in right hip: Secondary | ICD-10-CM | POA: Diagnosis not present

## 2017-10-02 DIAGNOSIS — R2681 Unsteadiness on feet: Secondary | ICD-10-CM | POA: Diagnosis not present

## 2017-10-02 DIAGNOSIS — Z9181 History of falling: Secondary | ICD-10-CM | POA: Diagnosis not present

## 2017-10-05 DIAGNOSIS — R2681 Unsteadiness on feet: Secondary | ICD-10-CM | POA: Diagnosis not present

## 2017-10-05 DIAGNOSIS — M6281 Muscle weakness (generalized): Secondary | ICD-10-CM | POA: Diagnosis not present

## 2017-10-05 DIAGNOSIS — M25551 Pain in right hip: Secondary | ICD-10-CM | POA: Diagnosis not present

## 2017-10-05 DIAGNOSIS — Z9181 History of falling: Secondary | ICD-10-CM | POA: Diagnosis not present

## 2017-10-07 ENCOUNTER — Ambulatory Visit (INDEPENDENT_AMBULATORY_CARE_PROVIDER_SITE_OTHER): Payer: Medicare Other | Admitting: Podiatry

## 2017-10-07 ENCOUNTER — Encounter: Payer: Self-pay | Admitting: Podiatry

## 2017-10-07 DIAGNOSIS — B351 Tinea unguium: Secondary | ICD-10-CM

## 2017-10-07 DIAGNOSIS — M79676 Pain in unspecified toe(s): Secondary | ICD-10-CM

## 2017-10-07 NOTE — Progress Notes (Signed)
[name], female   DOB: 05/17/1927, 84 y.o.   MRN: [id number] Complaint:  Visit Type: Patient returns to my office for continued preventative foot care services. Complaint: Patient states" my nails have grown long and thick and become painful to walk and wear shoes" . She presents for preventative foot care services. No changes to ROS.  Patient is receiving lovenox.  Podiatric Exam: Vascular: dorsalis pedis and posterior tibial pulses are palpable bilateral. Capillary return is immediate. Temperature gradient is WNL. Skin turgor WNL  Sensorium: Normal Semmes Weinstein monofilament test. Normal tactile sensation bilaterally. Nail Exam: Pt has thick disfigured discolored nails with subungual debris noted bilateral entire nail hallux through fifth toenails Ulcer Exam: There is no evidence of ulcer or pre-ulcerative changes or infection. Orthopedic Exam: Muscle tone and strength are WNL. No limitations in general ROM. No crepitus or effusions noted. HAV 1st MPJ  Right foot.. Skin: No Porokeratosis. No infection or ulcers.  Heloma durum 5th toe B/L  Diagnosis:  Tinea unguium, Pain in right toe, pain in left toe,    Treatment & Plan Procedures and Treatment: Consent by patient was obtained for treatment procedures. The patient understood the discussion of treatment and procedures well. All questions were answered thoroughly reviewed. Debridement of mycotic and hypertrophic toenails, 1 through 5 bilateral and clearing of subungual debris. No ulceration, no infection noted.  ABN signed for 2019. Return Visit-Office Procedure: Patient instructed to return to the office for a follow up visit 3 months for continued evaluation and treatment.   Gardiner Barefoot DPM

## 2017-10-08 DIAGNOSIS — M6281 Muscle weakness (generalized): Secondary | ICD-10-CM | POA: Diagnosis not present

## 2017-10-08 DIAGNOSIS — Z9181 History of falling: Secondary | ICD-10-CM | POA: Diagnosis not present

## 2017-10-08 DIAGNOSIS — R2681 Unsteadiness on feet: Secondary | ICD-10-CM | POA: Diagnosis not present

## 2017-10-08 DIAGNOSIS — M25551 Pain in right hip: Secondary | ICD-10-CM | POA: Diagnosis not present

## 2017-10-16 DIAGNOSIS — M25551 Pain in right hip: Secondary | ICD-10-CM | POA: Diagnosis not present

## 2017-10-16 DIAGNOSIS — Z9181 History of falling: Secondary | ICD-10-CM | POA: Diagnosis not present

## 2017-10-16 DIAGNOSIS — M6281 Muscle weakness (generalized): Secondary | ICD-10-CM | POA: Diagnosis not present

## 2017-10-16 DIAGNOSIS — R2681 Unsteadiness on feet: Secondary | ICD-10-CM | POA: Diagnosis not present

## 2017-10-21 DIAGNOSIS — R2681 Unsteadiness on feet: Secondary | ICD-10-CM | POA: Diagnosis not present

## 2017-10-21 DIAGNOSIS — M6281 Muscle weakness (generalized): Secondary | ICD-10-CM | POA: Diagnosis not present

## 2017-10-21 DIAGNOSIS — M25551 Pain in right hip: Secondary | ICD-10-CM | POA: Diagnosis not present

## 2017-10-21 DIAGNOSIS — Z9181 History of falling: Secondary | ICD-10-CM | POA: Diagnosis not present

## 2017-10-23 DIAGNOSIS — M25551 Pain in right hip: Secondary | ICD-10-CM | POA: Diagnosis not present

## 2017-10-23 DIAGNOSIS — Z9181 History of falling: Secondary | ICD-10-CM | POA: Diagnosis not present

## 2017-10-23 DIAGNOSIS — M6281 Muscle weakness (generalized): Secondary | ICD-10-CM | POA: Diagnosis not present

## 2017-10-23 DIAGNOSIS — R2681 Unsteadiness on feet: Secondary | ICD-10-CM | POA: Diagnosis not present

## 2017-10-26 DIAGNOSIS — R2681 Unsteadiness on feet: Secondary | ICD-10-CM | POA: Diagnosis not present

## 2017-10-26 DIAGNOSIS — Z9181 History of falling: Secondary | ICD-10-CM | POA: Diagnosis not present

## 2017-10-26 DIAGNOSIS — M25551 Pain in right hip: Secondary | ICD-10-CM | POA: Diagnosis not present

## 2017-10-26 DIAGNOSIS — M6281 Muscle weakness (generalized): Secondary | ICD-10-CM | POA: Diagnosis not present

## 2017-10-28 DIAGNOSIS — M6281 Muscle weakness (generalized): Secondary | ICD-10-CM | POA: Diagnosis not present

## 2017-10-28 DIAGNOSIS — Z9181 History of falling: Secondary | ICD-10-CM | POA: Diagnosis not present

## 2017-10-28 DIAGNOSIS — R2681 Unsteadiness on feet: Secondary | ICD-10-CM | POA: Diagnosis not present

## 2017-10-28 DIAGNOSIS — M25551 Pain in right hip: Secondary | ICD-10-CM | POA: Diagnosis not present

## 2018-01-06 ENCOUNTER — Ambulatory Visit (INDEPENDENT_AMBULATORY_CARE_PROVIDER_SITE_OTHER): Payer: Medicare Other | Admitting: Podiatry

## 2018-01-06 ENCOUNTER — Encounter: Payer: Self-pay | Admitting: Podiatry

## 2018-01-06 DIAGNOSIS — M79676 Pain in unspecified toe(s): Secondary | ICD-10-CM | POA: Diagnosis not present

## 2018-01-06 DIAGNOSIS — L84 Corns and callosities: Secondary | ICD-10-CM | POA: Diagnosis not present

## 2018-01-06 DIAGNOSIS — B351 Tinea unguium: Secondary | ICD-10-CM

## 2018-01-06 NOTE — Progress Notes (Signed)
[name], female   DOB: 09-07-1927, 84 y.o.   MRN: [id number] Complaint:  Visit Type: Patient returns to my office for continued preventative foot care services. Complaint: Patient states" my nails have grown long and thick and become painful to walk and wear shoes" . She presents for preventative foot care services. No changes to ROS.  Patient is receiving lovenox.  Podiatric Exam: Vascular: dorsalis pedis and posterior tibial pulses are palpable bilateral. Capillary return is immediate. Temperature gradient is WNL. Skin turgor WNL  Sensorium: Normal Semmes Weinstein monofilament test. Normal tactile sensation bilaterally. Nail Exam: Pt has thick disfigured discolored nails with subungual debris noted bilateral entire nail hallux through fifth toenails Ulcer Exam: There is no evidence of ulcer or pre-ulcerative changes or infection. Orthopedic Exam: Muscle tone and strength are WNL. No limitations in general ROM. No crepitus or effusions noted. HAV 1st MPJ  Right foot.. Skin: No Porokeratosis. No infection or ulcers.  Heloma durum 5th toe B/L  Diagnosis:  Tinea unguium, Pain in right toe, pain in left toe,    Treatment & Plan Procedures and Treatment: Consent by patient was obtained for treatment procedures. The patient understood the discussion of treatment and procedures well. All questions were answered thoroughly reviewed. Debridement of mycotic and hypertrophic toenails, 1 through 5 bilateral and clearing of subungual debris. No ulceration, no infection noted.  ABN signed for 2019. Return Visit-Office Procedure: Patient instructed to return to the office for a follow up visit 3 months for continued evaluation and treatment.   Gardiner Barefoot DPM

## 2018-01-25 DIAGNOSIS — R300 Dysuria: Secondary | ICD-10-CM | POA: Diagnosis not present

## 2018-01-25 DIAGNOSIS — Z681 Body mass index (BMI) 19 or less, adult: Secondary | ICD-10-CM | POA: Diagnosis not present

## 2018-01-25 DIAGNOSIS — Z124 Encounter for screening for malignant neoplasm of cervix: Secondary | ICD-10-CM | POA: Diagnosis not present

## 2018-01-25 DIAGNOSIS — N390 Urinary tract infection, site not specified: Secondary | ICD-10-CM | POA: Diagnosis not present

## 2018-01-25 DIAGNOSIS — L292 Pruritus vulvae: Secondary | ICD-10-CM | POA: Diagnosis not present

## 2018-02-23 DIAGNOSIS — H353131 Nonexudative age-related macular degeneration, bilateral, early dry stage: Secondary | ICD-10-CM | POA: Diagnosis not present

## 2018-02-23 DIAGNOSIS — H43813 Vitreous degeneration, bilateral: Secondary | ICD-10-CM | POA: Diagnosis not present

## 2018-03-31 ENCOUNTER — Ambulatory Visit: Payer: Medicare Other | Admitting: Podiatry

## 2018-05-07 ENCOUNTER — Ambulatory Visit: Payer: Medicare Other | Admitting: Podiatry

## 2018-06-02 ENCOUNTER — Encounter: Payer: Self-pay | Admitting: Podiatry

## 2018-06-02 ENCOUNTER — Ambulatory Visit (INDEPENDENT_AMBULATORY_CARE_PROVIDER_SITE_OTHER): Payer: Medicare Other | Admitting: Podiatry

## 2018-06-02 DIAGNOSIS — L84 Corns and callosities: Secondary | ICD-10-CM | POA: Diagnosis not present

## 2018-06-02 DIAGNOSIS — M79676 Pain in unspecified toe(s): Secondary | ICD-10-CM

## 2018-06-02 DIAGNOSIS — B351 Tinea unguium: Secondary | ICD-10-CM | POA: Diagnosis not present

## 2018-06-02 NOTE — Progress Notes (Signed)
[name], female   DOB: 26-Mar-1928, 85 y.o.   MRN: [id number] Complaint:  Visit Type: Patient returns to my office for continued preventative foot care services. Complaint: Patient states" my nails have grown long and thick and become painful to walk and wear shoes" . She presents for preventative foot care services. No changes to ROS.  Patient is receiving lovenox.  Podiatric Exam: Vascular: dorsalis pedis and posterior tibial pulses are palpable bilateral. Capillary return is immediate. Temperature gradient is WNL. Skin turgor WNL  Sensorium: Normal Semmes Weinstein monofilament test. Normal tactile sensation bilaterally. Nail Exam: Pt has thick disfigured discolored nails with subungual debris noted bilateral entire nail hallux through fifth toenails Ulcer Exam: There is no evidence of ulcer or pre-ulcerative changes or infection. Orthopedic Exam: Muscle tone and strength are WNL. No limitations in general ROM. No crepitus or effusions noted. HAV 1st MPJ  Right foot.. Skin: No Porokeratosis. No infection or ulcers.  Heloma durum 5th toe B/L  Diagnosis:  Tinea unguium, Pain in right toe, pain in left toe,    Treatment & Plan Procedures and Treatment: Consent by patient was obtained for treatment procedures. The patient understood the discussion of treatment and procedures well. All questions were answered thoroughly reviewed. Debridement of mycotic and hypertrophic toenails, 1 through 5 bilateral and clearing of subungual debris. No ulceration, no infection noted.  Return Visit-Office Procedure: Patient instructed to return to the office for a follow up visit 3 months for continued evaluation and treatment.   Gardiner Barefoot DPM

## 2018-08-11 ENCOUNTER — Ambulatory Visit: Payer: Medicare Other | Admitting: Podiatry

## 2018-09-23 ENCOUNTER — Telehealth: Payer: Self-pay | Admitting: Podiatry

## 2018-09-23 NOTE — Telephone Encounter (Signed)
I called pt's dtr, Patrice, states she is not able to see her in [place], they would be able to set pt up for evisit. I told Sharl Ma that since pt can not get out, let's do a evisit and if necessary the in-office or [place] doctor or NP. Sharl Ma states she will get this started.

## 2018-09-23 NOTE — Telephone Encounter (Signed)
Pts daughter called stating that the patient has swelling and pain in her left ankle and would like to know if there is something the doctor can recommend or see if a virtual visit can be done.

## 2018-10-01 ENCOUNTER — Telehealth: Payer: Medicare Other | Admitting: Podiatry

## 2018-10-01 ENCOUNTER — Other Ambulatory Visit: Payer: Self-pay

## 2018-10-03 NOTE — Progress Notes (Signed)
Waited over 15 minutes attempting to connect. Patient was having trouble with the software. Will reschedule.

## 2018-10-06 ENCOUNTER — Telehealth: Payer: Self-pay | Admitting: Podiatry

## 2018-10-07 ENCOUNTER — Telehealth: Payer: Self-pay | Admitting: Podiatry

## 2018-10-07 NOTE — Telephone Encounter (Signed)
Pt daughter has called to schedule her mother an appointment. She is scheduled with the nursing home and Dr. March Rummage on 10/12/2018 @ 12:50 for her swollen ankle. She is having some burning sensation when she wakes up in the morning and the patients daughter wanted to know what she can do in the mean time. Please call daughter Jonnie Finner at [phone]

## 2018-10-12 ENCOUNTER — Telehealth (INDEPENDENT_AMBULATORY_CARE_PROVIDER_SITE_OTHER): Payer: Medicare Other | Admitting: Podiatry

## 2018-10-12 VITALS — BP 138/74 | HR 73 | Temp 98.2°F

## 2018-10-12 DIAGNOSIS — R600 Localized edema: Secondary | ICD-10-CM

## 2018-10-12 DIAGNOSIS — I872 Venous insufficiency (chronic) (peripheral): Secondary | ICD-10-CM | POA: Diagnosis not present

## 2018-10-12 NOTE — Progress Notes (Signed)
  Virtual Visit via Video Note  I connected with Rhae Lerner on 10/12/18 at 12:50 PM EDT by a video enabled telemedicine application and verified that I am speaking with the correct person using two identifiers.  Location: Patient: Agricultural consultant. Provider: Office ([place])   I discussed the limitations of evaluation and management by telemedicine and the availability of in person appointments. The patient expressed understanding and agreed to proceed.  History of Present Illness: Swelling in left ankle. Going on for about 3 weeks. Denies injury to the area. Denies pain. Reports stinging that sometimes wakes her up at night. Pain goes away in the morning, but swelling stays throughout the day. Denies compression socks. Leg doesn't feel heavy.   Observations/Objective: Left side more swollen than right.  No pain with left calf squeeze.  No apparent musculoskeletal pain left ankle Varicosities bilateral ankle  Assessment and Plan: Venous insufficiency left leg -Order US venous reflux -Would consider unna boot.  Follow Up Instructions: F/u after ultrasound If [place] negative will order compression wrap to L leg   I discussed the assessment and treatment plan with the patient. The patient was provided an opportunity to ask questions and all were answered. The patient agreed with the plan and demonstrated an understanding of the instructions.   The patient was advised to call back or seek an in-person evaluation if the symptoms worsen or if the condition fails to improve as anticipated.  I provided 15 minutes of non-face-to-face time during this encounter.  Evelina Bucy, DPM

## 2018-10-18 ENCOUNTER — Telehealth: Payer: Self-pay | Admitting: Podiatry

## 2018-10-18 DIAGNOSIS — I872 Venous insufficiency (chronic) (peripheral): Secondary | ICD-10-CM

## 2018-10-18 DIAGNOSIS — R600 Localized edema: Secondary | ICD-10-CM

## 2018-10-18 NOTE — Telephone Encounter (Signed)
Pt mother is calling to follow up on the MRI, please call patient daughter with info

## 2018-10-19 NOTE — Addendum Note (Signed)
Addended by: Harriett Sine D on: 10/19/2018 08:55 AM   Modules accepted: Orders

## 2018-10-19 NOTE — Telephone Encounter (Signed)
Left message informing pt's dtr, Patrice I would order the doppler testing with [facility] and they would call her to schedule for pt.

## 2018-10-26 ENCOUNTER — Ambulatory Visit (HOSPITAL_COMMUNITY)
Admission: RE | Admit: 2018-10-26 | Discharge: 2018-10-26 | Disposition: A | Discharge status: Home / Self Care | Payer: Medicare Other | Source: Ambulatory Visit | Attending: Cardiology | Admitting: Cardiology

## 2018-10-26 ENCOUNTER — Other Ambulatory Visit: Payer: Self-pay

## 2018-10-26 DIAGNOSIS — I872 Venous insufficiency (chronic) (peripheral): Secondary | ICD-10-CM | POA: Insufficient documentation

## 2018-10-26 DIAGNOSIS — R600 Localized edema: Secondary | ICD-10-CM | POA: Insufficient documentation

## 2018-11-03 ENCOUNTER — Telehealth: Payer: Self-pay

## 2018-11-03 NOTE — Telephone Encounter (Signed)
Patient's daughter called wanting to know the results of her CV procedure/ dopplar.

## 2018-11-05 ENCOUNTER — Ambulatory Visit: Payer: Medicare Other | Admitting: Podiatry

## 2018-11-08 ENCOUNTER — Telehealth: Payer: Self-pay | Admitting: *Deleted

## 2018-11-08 NOTE — Telephone Encounter (Signed)
Pt's dtr, Patrice called for the vascular results.

## 2018-11-08 NOTE — Telephone Encounter (Signed)
Results are normal. Could be her pain is musculoskeletal and for further eval she'll need to come into the office. Please inform.

## 2018-11-09 NOTE — Telephone Encounter (Signed)
Kimberly Ramirez - scheduler scheduled pt 3 weeks into August.

## 2018-12-02 ENCOUNTER — Other Ambulatory Visit: Payer: Self-pay

## 2018-12-02 ENCOUNTER — Other Ambulatory Visit: Payer: Self-pay | Admitting: Podiatry

## 2018-12-02 ENCOUNTER — Ambulatory Visit (INDEPENDENT_AMBULATORY_CARE_PROVIDER_SITE_OTHER): Payer: Medicare Other | Admitting: Podiatry

## 2018-12-02 ENCOUNTER — Ambulatory Visit (INDEPENDENT_AMBULATORY_CARE_PROVIDER_SITE_OTHER): Payer: Medicare Other

## 2018-12-02 DIAGNOSIS — M7752 Other enthesopathy of left foot: Secondary | ICD-10-CM | POA: Diagnosis not present

## 2018-12-02 DIAGNOSIS — M7751 Other enthesopathy of right foot: Secondary | ICD-10-CM

## 2018-12-02 DIAGNOSIS — R609 Edema, unspecified: Secondary | ICD-10-CM | POA: Diagnosis not present

## 2018-12-02 DIAGNOSIS — B351 Tinea unguium: Secondary | ICD-10-CM

## 2018-12-02 DIAGNOSIS — M79676 Pain in unspecified toe(s): Secondary | ICD-10-CM | POA: Diagnosis not present

## 2018-12-31 DIAGNOSIS — Z23 Encounter for immunization: Secondary | ICD-10-CM | POA: Diagnosis not present

## 2019-01-09 NOTE — Progress Notes (Signed)
  Subjective:  [name], female    DOB: [birth year],  MRN: [id number]  No chief complaint on file.   85 y.o. female presents with the above complaint.  Reports continued edema worse on the left.  Complains of thickened toenails bilaterally  Review of Systems: Negative except as noted in the HPI. Denies N/V/F/Ch.  Past Medical History:  Diagnosis Date  . Closed right hip fracture ([place]) 05/2017  . TIA (transient ischemic attack)    No current outpatient medications on file.  Social History   Tobacco Use  Smoking Status Never Smoker  Smokeless Tobacco Never Used    No Known Allergies Objective:  There were no vitals filed for this visit. There is no height or weight on file to calculate BMI. Constitutional Well developed. Well nourished.  Vascular Dorsalis pedis pulses palpable bilaterally. Posterior tibial pulses palpable bilaterally. Capillary refill normal to all digits.  No cyanosis or clubbing noted. Pedal hair growth normal. Pitting edema bilaterally lower extremity  Neurologic Normal speech. Oriented to person, place, and time. Epicritic sensation to light touch grossly present bilaterally.  Dermatologic Nails elongated dystrophic pain to palpation No open wounds. No skin lesions.  Orthopedic: Normal joint ROM without pain or crepitus bilaterally. No visible deformities. No bony tenderness.   Radiographs: None Assessment:   1. Edema, unspecified type   2. Pain due to onychomycosis of toenail    Plan:  Patient was evaluated and treated and all questions answered.  Onychomycosis with pain -Nails palliatively debridement as below -Educated on self-care  Procedure: Nail Debridement Rationale: Pain Type of Debridement: manual, sharp debridement. Instrumentation: Nail nipper, rotary burr. Number of Nails: 10  Venous Insufficiency -Educated on etiology -Dispense tubigrip   No follow-ups on file.

## 2019-02-23 ENCOUNTER — Other Ambulatory Visit: Payer: Self-pay

## 2019-02-23 ENCOUNTER — Encounter: Payer: Self-pay | Admitting: Podiatry

## 2019-02-23 ENCOUNTER — Ambulatory Visit (INDEPENDENT_AMBULATORY_CARE_PROVIDER_SITE_OTHER): Payer: Medicare Other | Admitting: Podiatry

## 2019-02-23 DIAGNOSIS — M79676 Pain in unspecified toe(s): Secondary | ICD-10-CM

## 2019-02-23 DIAGNOSIS — B351 Tinea unguium: Secondary | ICD-10-CM | POA: Diagnosis not present

## 2019-02-23 DIAGNOSIS — L84 Corns and callosities: Secondary | ICD-10-CM

## 2019-02-23 NOTE — Progress Notes (Signed)
[name], female   DOB: 02/10/1928, 85 y.o.   MRN: [id number] Complaint:  Visit Type: Patient returns to my office for continued preventative foot care services. Complaint: Patient states" my nails have grown long and thick and become painful to walk and wear shoes" . She presents for preventative foot care services. No changes to ROS.  Patient is receiving lovenox.  Podiatric Exam: Vascular: dorsalis pedis and posterior tibial pulses are palpable bilateral. Capillary return is immediate. Temperature gradient is WNL. Skin turgor WNL  Sensorium: Normal Semmes Weinstein monofilament test. Normal tactile sensation bilaterally. Nail Exam: Pt has thick disfigured discolored nails with subungual debris noted bilateral entire nail hallux through fifth toenails Ulcer Exam: There is no evidence of ulcer or pre-ulcerative changes or infection. Orthopedic Exam: Muscle tone and strength are WNL. No limitations in general ROM. No crepitus or effusions noted. HAV 1st MPJ  Right foot.. Skin: No Porokeratosis. No infection or ulcers.  Heloma durum 5th toe left foot. Callus  B/L.  Diagnosis:  Tinea unguium, Pain in right toe, pain in left toe,  Callus  B/L  Treatment & Plan Procedures and Treatment: Consent by patient was obtained for treatment procedures. The patient understood the discussion of treatment and procedures well. All questions were answered thoroughly reviewed. Debridement of mycotic and hypertrophic toenails, 1 through 5 bilateral and clearing of subungual debris. No ulceration, no infection noted.  Return Visit-Office Procedure: Patient instructed to return to the office for a follow up visit 3 months for continued evaluation and treatment.   Gardiner Barefoot DPM

## 2019-05-05 DIAGNOSIS — Z23 Encounter for immunization: Secondary | ICD-10-CM | POA: Diagnosis not present

## 2019-05-25 ENCOUNTER — Ambulatory Visit: Payer: Medicare Other | Admitting: Podiatry

## 2019-06-02 DIAGNOSIS — Z23 Encounter for immunization: Secondary | ICD-10-CM | POA: Diagnosis not present

## 2019-07-04 ENCOUNTER — Emergency Department (HOSPITAL_COMMUNITY)
Admission: EM | Admit: 2019-07-04 | Discharge: 2019-07-05 | Disposition: A | Discharge status: Home / Self Care | Payer: Medicare Other | Attending: Emergency Medicine | Admitting: Emergency Medicine

## 2019-07-04 ENCOUNTER — Encounter (HOSPITAL_COMMUNITY): Payer: Self-pay | Admitting: Emergency Medicine

## 2019-07-04 ENCOUNTER — Emergency Department (HOSPITAL_COMMUNITY): Payer: Medicare Other

## 2019-07-04 ENCOUNTER — Other Ambulatory Visit: Payer: Self-pay

## 2019-07-04 DIAGNOSIS — S42224A 2-part nondisplaced fracture of surgical neck of right humerus, initial encounter for closed fracture: Secondary | ICD-10-CM | POA: Insufficient documentation

## 2019-07-04 DIAGNOSIS — R9431 Abnormal electrocardiogram [ECG] [EKG]: Secondary | ICD-10-CM | POA: Diagnosis not present

## 2019-07-04 DIAGNOSIS — R609 Edema, unspecified: Secondary | ICD-10-CM | POA: Diagnosis not present

## 2019-07-04 DIAGNOSIS — S59901A Unspecified injury of right elbow, initial encounter: Secondary | ICD-10-CM | POA: Diagnosis not present

## 2019-07-04 DIAGNOSIS — S4991XA Unspecified injury of right shoulder and upper arm, initial encounter: Secondary | ICD-10-CM | POA: Diagnosis present

## 2019-07-04 DIAGNOSIS — Y939 Activity, unspecified: Secondary | ICD-10-CM | POA: Diagnosis not present

## 2019-07-04 DIAGNOSIS — M79601 Pain in right arm: Secondary | ICD-10-CM | POA: Diagnosis not present

## 2019-07-04 DIAGNOSIS — W19XXXA Unspecified fall, initial encounter: Secondary | ICD-10-CM | POA: Diagnosis not present

## 2019-07-04 DIAGNOSIS — Y999 Unspecified external cause status: Secondary | ICD-10-CM | POA: Diagnosis not present

## 2019-07-04 DIAGNOSIS — S42294A Other nondisplaced fracture of upper end of right humerus, initial encounter for closed fracture: Secondary | ICD-10-CM | POA: Diagnosis not present

## 2019-07-04 DIAGNOSIS — W01198A Fall on same level from slipping, tripping and stumbling with subsequent striking against other object, initial encounter: Secondary | ICD-10-CM | POA: Insufficient documentation

## 2019-07-04 DIAGNOSIS — Y92039 Unspecified place in apartment as the place of occurrence of the external cause: Secondary | ICD-10-CM | POA: Diagnosis not present

## 2019-07-04 DIAGNOSIS — R52 Pain, unspecified: Secondary | ICD-10-CM | POA: Diagnosis not present

## 2019-07-04 DIAGNOSIS — S299XXA Unspecified injury of thorax, initial encounter: Secondary | ICD-10-CM | POA: Diagnosis not present

## 2019-07-04 DIAGNOSIS — M25519 Pain in unspecified shoulder: Secondary | ICD-10-CM | POA: Diagnosis not present

## 2019-07-04 DIAGNOSIS — I10 Essential (primary) hypertension: Secondary | ICD-10-CM | POA: Diagnosis not present

## 2019-07-04 NOTE — ED Triage Notes (Signed)
Patient arrived with EMS from [place] at [facility] lost her balance and fell this evening , no LOC , reports pain at right shoulder , no deformity /skin intact .

## 2019-07-04 NOTE — ED Notes (Signed)
Shanon Brow, son-in-law, would like to be updated [phone].

## 2019-07-05 ENCOUNTER — Emergency Department (HOSPITAL_COMMUNITY): Payer: Medicare Other

## 2019-07-05 DIAGNOSIS — S299XXA Unspecified injury of thorax, initial encounter: Secondary | ICD-10-CM | POA: Diagnosis not present

## 2019-07-05 DIAGNOSIS — I959 Hypotension, unspecified: Secondary | ICD-10-CM | POA: Diagnosis not present

## 2019-07-05 DIAGNOSIS — S59901A Unspecified injury of right elbow, initial encounter: Secondary | ICD-10-CM | POA: Diagnosis not present

## 2019-07-05 DIAGNOSIS — Z743 Need for continuous supervision: Secondary | ICD-10-CM | POA: Diagnosis not present

## 2019-07-05 DIAGNOSIS — R279 Unspecified lack of coordination: Secondary | ICD-10-CM | POA: Diagnosis not present

## 2019-07-05 DIAGNOSIS — S42224A 2-part nondisplaced fracture of surgical neck of right humerus, initial encounter for closed fracture: Secondary | ICD-10-CM | POA: Diagnosis not present

## 2019-07-05 DIAGNOSIS — M79601 Pain in right arm: Secondary | ICD-10-CM | POA: Diagnosis not present

## 2019-07-05 DIAGNOSIS — S72144D Nondisplaced intertrochanteric fracture of right femur, subsequent encounter for closed fracture with routine healing: Secondary | ICD-10-CM | POA: Diagnosis not present

## 2019-07-05 LAB — CBC WITH DIFFERENTIAL/PLATELET
Abs Immature Granulocytes: 0.04 10*3/uL (ref 0.00–0.07)
Basophils Absolute: 0 10*3/uL (ref 0.0–0.1)
Basophils Relative: 0 %
Eosinophils Absolute: 0 10*3/uL (ref 0.0–0.5)
Eosinophils Relative: 0 %
HCT: 35.2 % — ABNORMAL LOW (ref 36.0–46.0)
Hemoglobin: 11.2 g/dL — ABNORMAL LOW (ref 12.0–15.0)
Immature Granulocytes: 0 %
Lymphocytes Relative: 8 %
Lymphs Abs: 0.9 10*3/uL (ref 0.7–4.0)
MCH: 30.9 pg (ref 26.0–34.0)
MCHC: 31.8 g/dL (ref 30.0–36.0)
MCV: 97 fL (ref 80.0–100.0)
Monocytes Absolute: 0.7 10*3/uL (ref 0.1–1.0)
Monocytes Relative: 6 %
Neutro Abs: 10.7 10*3/uL — ABNORMAL HIGH (ref 1.7–7.7)
Neutrophils Relative %: 86 %
Platelets: 213 10*3/uL (ref 150–400)
RBC: 3.63 MIL/uL — ABNORMAL LOW (ref 3.87–5.11)
RDW: 12 % (ref 11.5–15.5)
WBC: 12.4 10*3/uL — ABNORMAL HIGH (ref 4.0–10.5)
nRBC: 0 % (ref 0.0–0.2)

## 2019-07-05 LAB — BASIC METABOLIC PANEL
Anion gap: 11 (ref 5–15)
BUN: 21 mg/dL (ref 8–23)
CO2: 21 mmol/L — ABNORMAL LOW (ref 22–32)
Calcium: 9.1 mg/dL (ref 8.9–10.3)
Chloride: 108 mmol/L (ref 98–111)
Creatinine, Ser: 1.08 mg/dL — ABNORMAL HIGH (ref 0.44–1.00)
GFR calc Af Amer: 53 mL/min — ABNORMAL LOW (ref >60)
GFR calc non Af Amer: 45 mL/min — ABNORMAL LOW (ref >60)
Glucose, Bld: 173 mg/dL — ABNORMAL HIGH (ref 70–99)
Potassium: 3.7 mmol/L (ref 3.5–5.1)
Sodium: 140 mmol/L (ref 135–145)

## 2019-07-05 MED ORDER — HYDROCODONE-ACETAMINOPHEN 5-325 MG PO TABS: 1.0000 | ORAL_TABLET | ORAL | 0 refills | Status: DC | PRN

## 2019-07-05 MED ORDER — HYDROCODONE-ACETAMINOPHEN 5-325 MG PO TABS
1.0000 | ORAL_TABLET | Freq: Once | ORAL | Status: AC
  Administered 2019-07-05: 1 via ORAL
  Filled 2019-07-05: qty 1

## 2019-07-05 NOTE — ED Notes (Signed)
Ortho tech at bedside 

## 2019-07-05 NOTE — ED Notes (Signed)
Pt transported to Xray. 

## 2019-07-05 NOTE — ED Provider Notes (Signed)
[facility] EMERGENCY DEPARTMENT Provider Note   CSN: [id number] Arrival date & time: 07/04/19  2231     History Chief Complaint  Patient presents with  . Fall    Kimberly Ramirez is a 86 y.o. female.  Patient presenting from assisted living facility after a fall.  States she was trying to adjust some drapery and fell against a flowerpots in her apartment.  She injured her right shoulder.  This happened around 9 or 10 PM.  She thinks she hit her head but Did not lose consciousness.  Complains of only right shoulder pain.  Does not use any blood thinners.  She did not take any pain medication. Denies any head, neck, back, chest or abdominal pain. She denies any chronic medical conditions other than previous hip replacement and use of a walker. History of TIA in the past. Denies any preceding dizziness, lightheadedness, chest pain, shortness of breath  The history is provided by the patient.  Fall Pertinent negatives include no chest pain, no abdominal pain, no headaches and no shortness of breath.       Past Medical History:  Diagnosis Date  . Closed right hip fracture ([place]) 05/2017  . TIA (transient ischemic attack)     Patient Active Problem List   Diagnosis Date Noted  . Closed right hip fracture, initial encounter ([facility]) 05/30/2017  . Leukocytosis 05/30/2017  . Hip fracture ([place]) 05/30/2017  . Dysuria 05/30/2017  . TIA (transient ischemic attack) 05/16/2016    Past Surgical History:  Procedure Laterality Date  . ABDOMINAL HYSTERECTOMY    . FEMUR IM NAIL Right 05/31/2017  . FRACTURE SURGERY     left leg  . INTRAMEDULLARY (IM) NAIL INTERTROCHANTERIC Right 05/31/2017   Procedure: INTRAMEDULLARY (IM) NAIL INTERTROCHANTRIC;  Surgeon: Hiram Gash, MD;  Location: [facility];  Service: Orthopedics;  Laterality: Right;     OB History   No obstetric history on file.     Family History  Problem Relation Age of Onset  . Parkinson's disease Mother   . Heart  Problems Father     Social History   Tobacco Use  . Smoking status: Never Smoker  . Smokeless tobacco: Never Used  Substance Use Topics  . Alcohol use: No  . Drug use: No    Home Medications Prior to Admission medications   Not on File    Allergies    Patient has no known allergies.  Review of Systems   Review of Systems  Constitutional: Negative for activity change, appetite change and fever.  HENT: Negative for congestion and rhinorrhea.   Respiratory: Negative for chest tightness and shortness of breath.   Cardiovascular: Negative for chest pain.  Gastrointestinal: Negative for abdominal pain, nausea and vomiting.  Genitourinary: Negative for dysuria and hematuria.  Musculoskeletal: Positive for arthralgias and myalgias.  Skin: Negative for rash.  Neurological: Negative for dizziness, weakness and headaches.   all other systems are negative except as noted in the HPI and PMH.    Physical Exam Updated Vital Signs BP (!) 101/49 (BP Location: Left Arm)   Pulse 78   Temp 98.5 F (36.9 C) (Oral)   Resp 16   Ht 5\' 2"  (1.575 m)   Wt 50 kg   SpO2 100%   BMI 20.16 kg/m   Physical Exam Vitals and nursing note reviewed.  Constitutional:      General: She is not in acute distress.    Appearance: She is well-developed.  HENT:  Head: Normocephalic and atraumatic.     Mouth/Throat:     Pharynx: No oropharyngeal exudate.  Eyes:     Conjunctiva/sclera: Conjunctivae normal.     Pupils: Pupils are equal, round, and reactive to light.  Neck:     Comments: No C-spine tenderness, no step-off or deformity. Cardiovascular:     Rate and Rhythm: Normal rate and regular rhythm.     Heart sounds: Normal heart sounds. No murmur.  Pulmonary:     Effort: Pulmonary effort is normal. No respiratory distress.     Breath sounds: Normal breath sounds.  Chest:     Chest wall: No tenderness.  Abdominal:     Palpations: Abdomen is soft.     Tenderness: There is no abdominal  tenderness. There is no guarding or rebound.  Musculoskeletal:        General: Swelling and tenderness present.     Cervical back: Normal range of motion and neck supple.     Comments: Swelling and tenderness to right lateral shoulder, no ecchymosis.  No gross deformity.  Intact radial pulse and cardinal hand movements.  Intact axillary nerve sensation.   Full range of motion of hips without pain.  No T or L-spine tenderness  Skin:    General: Skin is warm.  Neurological:     Mental Status: She is alert and oriented to person, place, and time.     Cranial Nerves: No cranial nerve deficit.     Motor: No abnormal muscle tone.     Coordination: Coordination normal.     Comments:  5/5 strength throughout. CN 2-12 intact.Equal grip strength.   Psychiatric:        Behavior: Behavior normal.     ED Results / Procedures / Treatments   Labs (all labs ordered are listed, but only abnormal results are displayed) Labs Reviewed  CBC WITH DIFFERENTIAL/PLATELET - Abnormal; Notable for the following components:      Result Value   WBC 12.4 (*)    RBC 3.63 (*)    Hemoglobin 11.2 (*)    HCT 35.2 (*)    Neutro Abs 10.7 (*)    All other components within normal limits  BASIC METABOLIC PANEL - Abnormal; Notable for the following components:   CO2 21 (*)    Glucose, Bld 173 (*)    Creatinine, Ser 1.08 (*)    GFR calc non Af Amer 45 (*)    GFR calc Af Amer 53 (*)    All other components within normal limits  URINALYSIS, ROUTINE W REFLEX MICROSCOPIC    EKG EKG Interpretation  Date/Time:  Monday July 04 2019 23:04:37 EDT Ventricular Rate:  82 PR Interval:  140 QRS Duration: 66 QT Interval:  406 QTC Calculation: 474 R Axis:   51 Text Interpretation: Normal sinus rhythm Nonspecific ST and T wave abnormality Abnormal ECG When compared with ECG of 05/31/2017, No significant change was found Confirmed by Delora Fuel ([id number]) on 07/05/2019 12:43:32 AM   Radiology DG Shoulder Right  Result  Date: 07/04/2019 CLINICAL DATA:  Fall pain EXAM: RIGHT SHOULDER - 2+ VIEW COMPARISON:  None. FINDINGS: There is a nondisplaced impacted fracture of the right humeral anatomic neck which involves the greater tuberosity. The humeral head still articulates with the glenoid. There is diffuse osteopenia. Mild overlying soft tissue swelling. IMPRESSION: Nondisplaced impacted right humeral anatomic neck fracture. Electronically Signed   By: Prudencio Pair M.D.   On: 07/04/2019 23:48    Procedures Procedures (including critical care time)  Medications Ordered in ED Medications  HYDROcodone-acetaminophen (NORCO/VICODIN) 5-325 MG per tablet 1 tablet (has no administration in time range)    ED Course  I have reviewed the triage vital signs and the nursing notes.  Pertinent labs & imaging results that were available during my care of the patient were reviewed by me and considered in my medical decision making (see chart for details).    MDM Rules/Calculators/A&P                     Patient from her assisted living facility after mechanical fall injuring her right shoulder.  Denies any significant head injury.  No blood thinner use.  No head or neck or back pain.  X-ray of her shoulder reveals impacted humeral neck fracture.  Patient states she does not have a headache.  She think she mildly bumped her head but denies having a headache or losing consciousness.  She does not take any blood thinners. No cervical spine pain.  Her son-in-law at bedside who is a Education officer, environmental states that she is at her baseline.  He does not feel that she needs any head or neck imaging.  Discussed with Dr. Noemi Chapel on-call for Dr. Griffin Basil.  They will see patient in the office at 1 PM tomorrow they agree with sling.  Patient placed in sling.  She is able to ambulate with assistance.  She appears stable to return to her facility.  Family is aware of orthopedic appointment tomorrow. Return precautions discussed. Final Clinical  Impression(s) / ED Diagnoses Final diagnoses:  Closed 2-part nondisplaced fracture of surgical neck of right humerus, initial encounter    Rx / DC Orders ED Discharge Orders    None       Letizia Hook, Annie Main, MD 07/05/19 [phone]

## 2019-07-05 NOTE — Discharge Instructions (Addendum)
Follow up with Dr. Griffin Basil at 1pm. Take the pain medication as prescribed. Followup with your doctor. Return to the ED if you develop new or worsening symptoms.

## 2019-07-05 NOTE — Progress Notes (Signed)
Orthopedic Tech Progress Note Patient Details:  Demaris Wirtz 10-21-1927 [id number]  Ortho Devices Type of Ortho Device: Shoulder immobilizer Ortho Device/Splint Location: RUE Ortho Device/Splint Interventions: Ordered, Application, Adjustment   Post Interventions Patient Tolerated: Well Instructions Provided: Care of device, Adjustment of device   Kalonji Zurawski N Avis Tirone 07/05/2019, 2:10 AM

## 2019-07-05 NOTE — ED Notes (Signed)
[facility] called to transport pt 

## 2019-07-12 ENCOUNTER — Other Ambulatory Visit: Payer: Self-pay | Admitting: Family Medicine

## 2019-07-12 ENCOUNTER — Other Ambulatory Visit: Payer: Self-pay | Admitting: Orthopaedic Surgery

## 2019-07-12 DIAGNOSIS — M25511 Pain in right shoulder: Secondary | ICD-10-CM

## 2019-07-13 ENCOUNTER — Ambulatory Visit
Admission: RE | Admit: 2019-07-13 | Discharge: 2019-07-13 | Disposition: A | Discharge status: Home / Self Care | Payer: Medicare Other | Source: Ambulatory Visit | Attending: Orthopaedic Surgery | Admitting: Orthopaedic Surgery

## 2019-07-13 DIAGNOSIS — M25511 Pain in right shoulder: Secondary | ICD-10-CM

## 2019-07-13 DIAGNOSIS — S42254A Nondisplaced fracture of greater tuberosity of right humerus, initial encounter for closed fracture: Secondary | ICD-10-CM | POA: Diagnosis not present

## 2019-07-14 NOTE — Patient Instructions (Addendum)
DUE TO COVID-19 ONLY ONE VISITOR IS ALLOWED TO COME WITH YOU AND STAY IN THE WAITING ROOM ONLY DURING PRE OP AND PROCEDURE DAY OF SURGERY. THE 1 VISITOR MAY VISIT WITH YOU AFTER SURGERY IN YOUR PRIVATE ROOM DURING VISITING HOURS ONLY!  YOU NEED TO HAVE A COVID 19 TEST ON_4/3/21______ @__11 :10_____, THIS TEST MUST BE DONE BEFORE SURGERY, COME  [place] , [id number].  ([place]) ONCE YOUR COVID TEST IS COMPLETED, PLEASE BEGIN THE QUARANTINE INSTRUCTIONS AS OUTLINED IN YOUR HANDOUT.                Kimberly Ramirez   Your procedure is scheduled on: 07/20/19   Report to [facility]  [facility]   Report to admitting at  9:00 AM     Call this number if you have problems the morning of surgery [place], NO CHEWING GUM [place] .  Do not eat food After Midnight.   YOU MAY HAVE CLEAR LIQUIDS FROM MIDNIGHT UNTIL 8:30 AM.   At 8:30 AM Please finish the prescribed Pre-Surgery  drink  . Nothing by mouth after you finish the  drink !   Take these medicines the morning of surgery with A SIP OF WATER: Vicodin if needed for pain                                 You may not have any metal on your body including hair pins and              piercings  Do not wear jewelry, make-up, lotions, powders or perfumes, deodorant             Do not wear nail polish on your fingernails.  Do not shave  48 hours prior to surgery.              Do not bring valuables to the hospital. [place].  Contacts, dentures or bridgework may not be worn into surgery.     Special Instructions: N/A              Please read over the following fact sheets you were given: _____________________________________________________________________             [facility] - Preparing for Surgery Before surgery, you can play an important role.   Because skin is  not sterile, your skin needs to be as free of germs as possible.   You can reduce the number of germs on your skin by washing with CHG (chlorahexidine gluconate) soap before surgery.   CHG is an antiseptic cleaner which kills germs and bonds with the skin to continue killing germs even after washing. Please DO NOT use if you have an allergy to CHG or antibacterial soaps.   If your skin becomes reddened/irritated stop using the CHG and inform your nurse when you arrive at Short Stay. Do not shave (including legs and underarms) for at least 48 hours prior to the first CHG shower.  . Please follow these instructions carefully:  1.  Shower with CHG Soap the night before surgery and the  morning of Surgery.  2.  If you choose to wash your hair, wash your hair first as usual  with your  normal  shampoo.  3.  After you shampoo, rinse your hair and body thoroughly to remove the  shampoo.                                        4.  Use CHG as you would any other liquid soap.  You can apply chg directly  to the skin and wash                       Gently with a scrungie or clean washcloth.  5.  Apply the CHG Soap to your body ONLY FROM THE NECK DOWN.   Do not use on face/ open                           Wound or open sores. Avoid contact with eyes, ears mouth and genitals (private parts).                       Wash face,  Genitals (private parts) with your normal soap.             6.  Wash thoroughly, paying special attention to the area where your surgery  will be performed.  7.  Thoroughly rinse your body with warm water from the neck down.  8.  DO NOT shower/wash with your normal soap after using and rinsing off  the CHG Soap.             9.  Pat yourself dry with a clean towel.            10.  Wear clean pajamas.            11.  Place clean sheets on your bed the night of your first shower and do not  sleep with pets. Day of Surgery : Do not apply any lotions/deodorants the morning of surgery.  Please  wear clean clothes to the hospital/surgery center.  FAILURE TO FOLLOW THESE INSTRUCTIONS MAY RESULT IN THE CANCELLATION OF YOUR SURGERY PATIENT SIGNATURE_________________________________  NURSE SIGNATURE__________________________________  ________________________________________________________________________   Kimberly Ramirez  An incentive spirometer is a tool that can help keep your lungs clear and active. This tool measures how well you are filling your lungs with each breath. Taking long deep breaths may help reverse or decrease the chance of developing breathing (pulmonary) problems (especially infection) following:  A long period of time when you are unable to move or be active. BEFORE THE PROCEDURE   If the spirometer includes an indicator to show your best effort, your nurse or respiratory therapist will set it to a desired goal.  If possible, sit up straight or lean slightly forward. Try not to slouch.  Hold the incentive spirometer in an upright position. INSTRUCTIONS FOR USE  1. Sit on the edge of your bed if possible, or sit up as far as you can in bed or on a chair. 2. Hold the incentive spirometer in an upright position. 3. Breathe out normally. 4. Place the mouthpiece in your mouth and seal your lips tightly around it. 5. Breathe in slowly and as deeply as possible, raising the piston or the ball toward the top of the column. 6. Hold your breath for 3-5 seconds or for as long as possible. Allow the piston or ball to fall to the  bottom of the column. 7. Remove the mouthpiece from your mouth and breathe out normally. 8. Rest for a few seconds and repeat Steps 1 through 7 at least 10 times every 1-2 hours when you are awake. Take your time and take a few normal breaths between deep breaths. 9. The spirometer may include an indicator to show your best effort. Use the indicator as a goal to work toward during each repetition. 10. After each set of 10 deep breaths,  practice coughing to be sure your lungs are clear. If you have an incision (the cut made at the time of surgery), support your incision when coughing by placing a pillow or rolled up towels firmly against it. Once you are able to get out of bed, walk around indoors and cough well. You may stop using the incentive spirometer when instructed by your caregiver.  RISKS AND COMPLICATIONS  Take your time so you do not get dizzy or light-headed.  If you are in pain, you may need to take or ask for pain medication before doing incentive spirometry. It is harder to take a deep breath if you are having pain. AFTER USE  Rest and breathe slowly and easily.  It can be helpful to keep track of a log of your progress. Your caregiver can provide you with a simple table to help with this. If you are using the spirometer at home, follow these instructions: [place] IF:   You are having difficultly using the spirometer.  You have trouble using the spirometer as often as instructed.  Your pain medication is not giving enough relief while using the spirometer.  You develop fever of 100.5 F (38.1 C) or higher. SEEK IMMEDIATE MEDICAL CARE IF:   You cough up bloody sputum that had not been present before.  You develop fever of 102 F (38.9 C) or greater.  You develop worsening pain at or near the incision site. MAKE SURE YOU:   Understand these instructions.  Will watch your condition.  Will get help right away if you are not doing well or get worse. Document Released: 08/11/2006 Document Revised: 06/23/2011 Document Reviewed: 10/12/2006 [facility] Patient Information 2014 [place], [place].   ________________________________________________________________________

## 2019-07-16 ENCOUNTER — Other Ambulatory Visit (HOSPITAL_COMMUNITY): Payer: TRICARE For Life (TFL)

## 2019-07-18 ENCOUNTER — Other Ambulatory Visit: Payer: Self-pay

## 2019-07-18 ENCOUNTER — Encounter (HOSPITAL_COMMUNITY)
Admission: RE | Admit: 2019-07-18 | Discharge: 2019-07-18 | Disposition: A | Discharge status: Home / Self Care | Payer: Medicare Other | Source: Ambulatory Visit | Attending: Orthopaedic Surgery | Admitting: Orthopaedic Surgery

## 2019-07-18 ENCOUNTER — Other Ambulatory Visit (HOSPITAL_COMMUNITY)
Admission: RE | Admit: 2019-07-18 | Discharge: 2019-07-18 | Disposition: A | Discharge status: Home / Self Care | Payer: Medicare Other | Source: Ambulatory Visit | Attending: Orthopaedic Surgery | Admitting: Orthopaedic Surgery

## 2019-07-18 ENCOUNTER — Encounter (HOSPITAL_COMMUNITY): Payer: Self-pay

## 2019-07-18 DIAGNOSIS — Z01812 Encounter for preprocedural laboratory examination: Secondary | ICD-10-CM | POA: Diagnosis not present

## 2019-07-18 DIAGNOSIS — Z20822 Contact with and (suspected) exposure to covid-19: Secondary | ICD-10-CM | POA: Insufficient documentation

## 2019-07-18 LAB — CBC
HCT: 38.2 % (ref 36.0–46.0)
Hemoglobin: 11.8 g/dL — ABNORMAL LOW (ref 12.0–15.0)
MCH: 31.6 pg (ref 26.0–34.0)
MCHC: 30.9 g/dL (ref 30.0–36.0)
MCV: 102.1 fL — ABNORMAL HIGH (ref 80.0–100.0)
Platelets: 303 10*3/uL (ref 150–400)
RBC: 3.74 MIL/uL — ABNORMAL LOW (ref 3.87–5.11)
RDW: 12.8 % (ref 11.5–15.5)
WBC: 8.4 10*3/uL (ref 4.0–10.5)
nRBC: 0 % (ref 0.0–0.2)

## 2019-07-18 LAB — SURGICAL PCR SCREEN
MRSA, PCR: NEGATIVE
Staphylococcus aureus: NEGATIVE

## 2019-07-18 LAB — ABO/RH: ABO/RH(D): O POS

## 2019-07-18 LAB — SARS CORONAVIRUS 2 (TAT 6-24 HRS): SARS Coronavirus 2: NEGATIVE

## 2019-07-18 NOTE — Progress Notes (Signed)
PCP - Dr. Particia Nearing Cardiologist - no  Chest x-ray - no EKG - 06/14/19 Stress Test - no ECHO - 2018 Cardiac Cath - no  Sleep Study - no CPAP -   Fasting Blood Sugar - NA Checks Blood Sugar _____ times a day  Blood Thinner Instructions:NA Aspirin Instructions: Last Dose:  Anesthesia review:   Patient denies shortness of breath, fever, cough and chest pain at PAT appointment yes  Patient verbalized understanding of instructions that were given to them at the PAT appointment. Patient was also instructed that they will need to review over the PAT instructions again at home before surgery. yes

## 2019-07-19 NOTE — Anesthesia Preprocedure Evaluation (Signed)
Anesthesia Evaluation  Patient identified by MRN, date of birth, ID band Patient awake    Reviewed: Allergy & Precautions, H&P , NPO status , Patient's Chart, lab work & pertinent test results  Airway Mallampati: III  TM Distance: >3 FB Neck ROM: Full    Dental no notable dental hx. (+) Teeth Intact, Dental Advisory Given   Pulmonary neg pulmonary ROS,    Pulmonary exam normal breath sounds clear to auscultation       Cardiovascular hypertension,  Rhythm:Regular Rate:Normal     Neuro/Psych TIAnegative psych ROS   GI/Hepatic negative GI ROS, Neg liver ROS,   Endo/Other  negative endocrine ROS  Renal/GU negative Renal ROS  negative genitourinary   Musculoskeletal   Abdominal   Peds  Hematology negative hematology ROS (+)   Anesthesia Other Findings   Reproductive/Obstetrics negative OB ROS                             Anesthesia Physical  Anesthesia Plan  ASA: III  Anesthesia Plan: General   Post-op Pain Management: GA combined w/ Regional for post-op pain   Induction: Intravenous  PONV Risk Score and Plan: 4 or greater and Ondansetron, Dexamethasone and Treatment may vary due to age or medical condition  Airway Management Planned: Oral ETT and LMA  Additional Equipment:   Intra-op Plan:   Post-operative Plan: Extubation in OR  Informed Consent: I have reviewed the patients History and Physical, chart, labs and discussed the procedure including the risks, benefits and alternatives for the proposed anesthesia with the patient or authorized representative who has indicated his/her understanding and acceptance.     Dental advisory given  Plan Discussed with: CRNA and Anesthesiologist  Anesthesia Plan Comments: (Discussed both nerve block for pain relief post-op and GA; including NV, sore throat, dental injury, and pulmonary complications)        Anesthesia Quick  Evaluation

## 2019-07-19 NOTE — H&P (Signed)
PREOPERATIVE H&P  Chief Complaint: right proximal humerus fracture  HPI: Kimberly Ramirez is a 86 y.o. female who is scheduled for REVERSE TOTAL SHOULDER ARTHROPLASTY.   Patient has a past medical history significant for hypertension, hyperlipidemia, and TIA in 2019.   This is an 86 year old female who had a fall on 07/04/2019. She was taken to the ER by ambulance. X-rays at the ED showed proximal humerus fracture. Patient was placed in a sling.  She has been uncomfortable since then.  She has been using Tylenol for pain control.    Her symptoms are rated as moderate to severe, and have been worsening.  This is significantly impairing activities of daily living.    Please see clinic note for further details on this patient's care.    She has elected for surgical management.   Past Medical History:  Diagnosis Date  . Closed right hip fracture ([place]) 05/2017  . TIA (transient ischemic attack) 2019   Past Surgical History:  Procedure Laterality Date  . ABDOMINAL HYSTERECTOMY    . FEMUR IM NAIL Right 05/31/2017  . FRACTURE SURGERY     left leg  . INTRAMEDULLARY (IM) NAIL INTERTROCHANTERIC Right 05/31/2017   Procedure: INTRAMEDULLARY (IM) NAIL INTERTROCHANTRIC;  Surgeon: Hiram Gash, MD;  Location: [facility];  Service: Orthopedics;  Laterality: Right;   Social History   Socioeconomic History  . Marital status: Widowed    Spouse name: Not on file  . Number of children: Not on file  . Years of education: Not on file  . Highest education level: Not on file  Occupational History  . Not on file  Tobacco Use  . Smoking status: Never Smoker  . Smokeless tobacco: Never Used  Substance and Sexual Activity  . Alcohol use: No  . Drug use: No  . Sexual activity: Not on file  Other Topics Concern  . Not on file  Social History Narrative  . Not on file   Social Determinants of Health   Financial Resource Strain:   . Difficulty of Paying Living Expenses:   Food Insecurity:   .  Worried About Charity fundraiser in the Last Year:   . Arboriculturist in the Last Year:   Transportation Needs:   . Film/video editor (Medical):   Marland Kitchen Lack of Transportation (Non-Medical):   Physical Activity:   . Days of Exercise per Week:   . Minutes of Exercise per Session:   Stress:   . Feeling of Stress :   Social Connections:   . Frequency of Communication with Friends and Family:   . Frequency of Social Gatherings with Friends and Family:   . Attends Religious Services:   . Active Member of Clubs or Organizations:   . Attends Archivist Meetings:   Marland Kitchen Marital Status:    Family History  Problem Relation Age of Onset  . Parkinson's disease Mother   . Heart Problems Father    No Known Allergies Prior to Admission medications   Medication Sig Start Date End Date Taking? Authorizing Provider  acetaminophen (TYLENOL) 500 MG tablet Take 1,000 mg by mouth every 6 (six) hours as needed for moderate pain.   Yes [provider]  HYDROcodone-acetaminophen (NORCO/VICODIN) 5-325 MG tablet Take 1 tablet by mouth every 4 (four) hours as needed. 07/05/19   Rancour, Annie Main, MD    ROS: All other systems have been reviewed and were otherwise negative with the exception of those mentioned in the HPI  and as above.  Physical Exam: General: Alert, no acute distress Cardiovascular: No pedal edema Respiratory: No cyanosis, no use of accessory musculature GI: No organomegaly, abdomen is soft and non-tender Skin: No lesions in the area of chief complaint Neurologic: Sensation intact distally Psychiatric: Patient is competent for consent with normal mood and affect Lymphatic: No axillary or cervical lymphadenopathy  MUSCULOSKELETAL:  Right upper arm: No range of motion testing in the early setting after injury. Distal motor and sensory function intact. Warm and well perfused arm.    Imaging: X-rays reviewed and compared to previous demonstrate displacement of the  proximal humerus fracture with apex anterior angulation and almost 100% displacement noted on x-ray.    CT of the right shoulder demonstrates: "Transverse surgical neck fracture includes a nondisplaced component in the greater tuberosity. Retraction by the rotator cuff results in medial rotation of the humeral head as described above."  Assessment: right proximal humerus fracture  Plan: Plan for Procedure(s): REVERSE TOTAL SHOULDER ARTHROPLASTY  She understands the risks, benefits and alternatives of reverse total shoulder arthroplasty. Dr. Griffin Basil, the patient, and her family talked about the possibility of not repairing her tuberosities in light of her need to potentially use the arm earlier versus a robust repair to allow for some early motion with minimal bone left on the tuberosities.    The risks benefits and alternatives were discussed with the patient including but not limited to the risks of nonoperative treatment, versus surgical intervention including infection, bleeding, nerve injury,  blood clots, cardiopulmonary complications, morbidity, mortality, among others, and they were willing to proceed.   We additionally specifically discussed risks of axillary nerve injury, infection, periprosthetic fracture, continued pain and longevity of implants prior to beginning procedure.    Patient will be admitted for observation after surgery due to her age and co-morbities. She will be monitored after surgery for pain control, OT, prophylactic antibiotics, and discharge planning. The patient is planning to be discharged home morning after surgery with outpatient PT.   The patient acknowledged the explanation, agreed to proceed with the plan and consent was signed.   Operative Plan: Right reverse total shoulder arthroplasty for fracture Discharge Medications: Tylenol, Celebrex, Oxycodone, Zofran DVT Prophylaxis: None Physical Therapy: Outpatient PT Special Discharge needs:  [place], PA-C  07/19/2019 7:23 AM

## 2019-07-20 ENCOUNTER — Ambulatory Visit (HOSPITAL_COMMUNITY): Payer: Medicare Other | Admitting: Anesthesiology

## 2019-07-20 ENCOUNTER — Encounter (HOSPITAL_COMMUNITY)
Admission: RE | Disposition: A | Discharge status: Home / Self Care | Payer: Self-pay | Source: Home / Self Care | Attending: Orthopaedic Surgery

## 2019-07-20 ENCOUNTER — Observation Stay (HOSPITAL_COMMUNITY): Payer: Medicare Other

## 2019-07-20 ENCOUNTER — Telehealth (HOSPITAL_COMMUNITY): Payer: Self-pay | Admitting: *Deleted

## 2019-07-20 ENCOUNTER — Encounter (HOSPITAL_COMMUNITY): Payer: Self-pay | Admitting: Orthopaedic Surgery

## 2019-07-20 ENCOUNTER — Other Ambulatory Visit: Payer: Self-pay

## 2019-07-20 ENCOUNTER — Observation Stay (HOSPITAL_COMMUNITY)
Admission: RE | Admit: 2019-07-20 | Discharge: 2019-07-21 | Disposition: A | Discharge status: Home / Self Care | Payer: Medicare Other | Attending: Orthopaedic Surgery | Admitting: Orthopaedic Surgery

## 2019-07-20 DIAGNOSIS — W19XXXA Unspecified fall, initial encounter: Secondary | ICD-10-CM | POA: Diagnosis not present

## 2019-07-20 DIAGNOSIS — E785 Hyperlipidemia, unspecified: Secondary | ICD-10-CM | POA: Diagnosis not present

## 2019-07-20 DIAGNOSIS — G8918 Other acute postprocedural pain: Secondary | ICD-10-CM | POA: Diagnosis not present

## 2019-07-20 DIAGNOSIS — M65811 Other synovitis and tenosynovitis, right shoulder: Secondary | ICD-10-CM | POA: Insufficient documentation

## 2019-07-20 DIAGNOSIS — Z8673 Personal history of transient ischemic attack (TIA), and cerebral infarction without residual deficits: Secondary | ICD-10-CM | POA: Insufficient documentation

## 2019-07-20 DIAGNOSIS — S42211A Unspecified displaced fracture of surgical neck of right humerus, initial encounter for closed fracture: Secondary | ICD-10-CM | POA: Diagnosis not present

## 2019-07-20 DIAGNOSIS — Z09 Encounter for follow-up examination after completed treatment for conditions other than malignant neoplasm: Secondary | ICD-10-CM

## 2019-07-20 DIAGNOSIS — G459 Transient cerebral ischemic attack, unspecified: Secondary | ICD-10-CM | POA: Diagnosis not present

## 2019-07-20 DIAGNOSIS — R410 Disorientation, unspecified: Secondary | ICD-10-CM | POA: Diagnosis not present

## 2019-07-20 DIAGNOSIS — S42201A Unspecified fracture of upper end of right humerus, initial encounter for closed fracture: Secondary | ICD-10-CM | POA: Diagnosis present

## 2019-07-20 DIAGNOSIS — I10 Essential (primary) hypertension: Secondary | ICD-10-CM | POA: Insufficient documentation

## 2019-07-20 DIAGNOSIS — M19011 Primary osteoarthritis, right shoulder: Secondary | ICD-10-CM | POA: Diagnosis not present

## 2019-07-20 HISTORY — PX: TOTAL SHOULDER ARTHROPLASTY: SHX126

## 2019-07-20 LAB — TYPE AND SCREEN
ABO/RH(D): O POS
Antibody Screen: NEGATIVE

## 2019-07-20 SURGERY — ARTHROPLASTY, SHOULDER, TOTAL
Anesthesia: General | Site: Shoulder | Laterality: Right

## 2019-07-20 MED ORDER — DOCUSATE SODIUM 100 MG PO CAPS
100.0000 mg | ORAL_CAPSULE | Freq: Two times a day (BID) | ORAL | Status: DC
  Administered 2019-07-20 – 2019-07-21 (×2): 100 mg via ORAL
  Filled 2019-07-20 (×2): qty 1

## 2019-07-20 MED ORDER — DIPHENHYDRAMINE HCL 12.5 MG/5ML PO ELIX: 12.5000 mg | ORAL_SOLUTION | ORAL | Status: DC | PRN

## 2019-07-20 MED ORDER — BISACODYL 10 MG RE SUPP: 10.0000 mg | Freq: Every day | RECTAL | Status: DC | PRN

## 2019-07-20 MED ORDER — METOCLOPRAMIDE HCL 5 MG PO TABS: 5.0000 mg | ORAL_TABLET | Freq: Three times a day (TID) | ORAL | Status: DC | PRN

## 2019-07-20 MED ORDER — ACETAMINOPHEN 325 MG PO TABS: 325.0000 mg | ORAL_TABLET | ORAL | Status: DC | PRN

## 2019-07-20 MED ORDER — ONDANSETRON HCL 4 MG/2ML IJ SOLN: 4.0000 mg | Freq: Once | INTRAMUSCULAR | Status: DC | PRN

## 2019-07-20 MED ORDER — PHENYLEPHRINE HCL-NACL 10-0.9 MG/250ML-% IV SOLN
INTRAVENOUS | Status: DC | PRN
  Administered 2019-07-20: 50 ug/min via INTRAVENOUS

## 2019-07-20 MED ORDER — ONDANSETRON HCL 4 MG PO TABS: 4.0000 mg | ORAL_TABLET | Freq: Four times a day (QID) | ORAL | Status: DC | PRN

## 2019-07-20 MED ORDER — MENTHOL 3 MG MT LOZG: 1.0000 | LOZENGE | OROMUCOSAL | Status: DC | PRN

## 2019-07-20 MED ORDER — MEPERIDINE HCL 50 MG/ML IJ SOLN: 6.2500 mg | INTRAMUSCULAR | Status: DC | PRN

## 2019-07-20 MED ORDER — PHENYLEPHRINE 40 MCG/ML (10ML) SYRINGE FOR IV PUSH (FOR BLOOD PRESSURE SUPPORT)
PREFILLED_SYRINGE | INTRAVENOUS | Status: AC
  Filled 2019-07-20: qty 20

## 2019-07-20 MED ORDER — EPHEDRINE SULFATE-NACL 50-0.9 MG/10ML-% IV SOSY
PREFILLED_SYRINGE | INTRAVENOUS | Status: DC | PRN
  Administered 2019-07-20 (×2): 5 mg via INTRAVENOUS

## 2019-07-20 MED ORDER — BUPIVACAINE LIPOSOME 1.3 % IJ SUSP
INTRAMUSCULAR | Status: DC | PRN
  Administered 2019-07-20: 10 mL via PERINEURAL

## 2019-07-20 MED ORDER — VANCOMYCIN HCL 1 G IV SOLR
INTRAVENOUS | Status: DC | PRN
  Administered 2019-07-20: 1000 mg via TOPICAL

## 2019-07-20 MED ORDER — ACETAMINOPHEN 500 MG PO TABS
1000.0000 mg | ORAL_TABLET | Freq: Three times a day (TID) | ORAL | Status: DC
  Administered 2019-07-20 – 2019-07-21 (×2): 1000 mg via ORAL
  Filled 2019-07-20 (×2): qty 2

## 2019-07-20 MED ORDER — OXYCODONE HCL 5 MG PO TABS: 5.0000 mg | ORAL_TABLET | Freq: Once | ORAL | Status: DC | PRN

## 2019-07-20 MED ORDER — PHENOL 1.4 % MT LIQD: 1.0000 | OROMUCOSAL | Status: DC | PRN

## 2019-07-20 MED ORDER — POLYETHYLENE GLYCOL 3350 17 G PO PACK: 17.0000 g | PACK | Freq: Every day | ORAL | Status: DC | PRN

## 2019-07-20 MED ORDER — CEFAZOLIN SODIUM-DEXTROSE 2-4 GM/100ML-% IV SOLN
2.0000 g | INTRAVENOUS | Status: AC
  Administered 2019-07-20: 2 g via INTRAVENOUS
  Filled 2019-07-20: qty 100

## 2019-07-20 MED ORDER — ONDANSETRON HCL 4 MG/2ML IJ SOLN
INTRAMUSCULAR | Status: DC | PRN
  Administered 2019-07-20: 4 mg via INTRAVENOUS

## 2019-07-20 MED ORDER — STERILE WATER FOR IRRIGATION IR SOLN
Status: DC | PRN
  Administered 2019-07-20: 2000 mL

## 2019-07-20 MED ORDER — FENTANYL CITRATE (PF) 100 MCG/2ML IJ SOLN
INTRAMUSCULAR | Status: DC | PRN
  Administered 2019-07-20 (×2): 25 ug via INTRAVENOUS
  Administered 2019-07-20: 50 ug via INTRAVENOUS
  Administered 2019-07-20 (×2): 25 ug via INTRAVENOUS

## 2019-07-20 MED ORDER — BUPIVACAINE-EPINEPHRINE (PF) 0.5% -1:200000 IJ SOLN
INTRAMUSCULAR | Status: DC | PRN
  Administered 2019-07-20: 10 mL via PERINEURAL

## 2019-07-20 MED ORDER — LIDOCAINE 2% (20 MG/ML) 5 ML SYRINGE
INTRAMUSCULAR | Status: DC | PRN
  Administered 2019-07-20: 100 mg via INTRAVENOUS

## 2019-07-20 MED ORDER — DEXAMETHASONE SODIUM PHOSPHATE 10 MG/ML IJ SOLN
INTRAMUSCULAR | Status: DC | PRN
  Administered 2019-07-20: 4 mg via INTRAVENOUS

## 2019-07-20 MED ORDER — OXYCODONE HCL 5 MG PO TABS
5.0000 mg | ORAL_TABLET | ORAL | Status: DC | PRN
  Administered 2019-07-21: 5 mg via ORAL
  Filled 2019-07-20: qty 1

## 2019-07-20 MED ORDER — ONDANSETRON HCL 4 MG/2ML IJ SOLN: 4.0000 mg | Freq: Four times a day (QID) | INTRAMUSCULAR | Status: DC | PRN

## 2019-07-20 MED ORDER — PHENYLEPHRINE 40 MCG/ML (10ML) SYRINGE FOR IV PUSH (FOR BLOOD PRESSURE SUPPORT)
PREFILLED_SYRINGE | INTRAVENOUS | Status: DC | PRN
  Administered 2019-07-20: 160 ug via INTRAVENOUS
  Administered 2019-07-20 (×2): 80 ug via INTRAVENOUS
  Administered 2019-07-20 (×2): 120 ug via INTRAVENOUS
  Administered 2019-07-20: 80 ug via INTRAVENOUS

## 2019-07-20 MED ORDER — MAGNESIUM CITRATE PO SOLN: 1.0000 | Freq: Once | ORAL | Status: DC | PRN

## 2019-07-20 MED ORDER — METOCLOPRAMIDE HCL 5 MG/ML IJ SOLN: 5.0000 mg | Freq: Three times a day (TID) | INTRAMUSCULAR | Status: DC | PRN

## 2019-07-20 MED ORDER — SODIUM CHLORIDE 0.9 % IR SOLN
Status: DC | PRN
  Administered 2019-07-20: 1000 mL

## 2019-07-20 MED ORDER — VANCOMYCIN HCL 1000 MG IV SOLR
INTRAVENOUS | Status: AC
  Filled 2019-07-20: qty 1000

## 2019-07-20 MED ORDER — FENTANYL CITRATE (PF) 100 MCG/2ML IJ SOLN: 50.0000 ug | INTRAMUSCULAR | Status: DC

## 2019-07-20 MED ORDER — FENTANYL CITRATE (PF) 100 MCG/2ML IJ SOLN
INTRAMUSCULAR | Status: AC
  Filled 2019-07-20: qty 2

## 2019-07-20 MED ORDER — PROPOFOL 10 MG/ML IV BOLUS
INTRAVENOUS | Status: DC | PRN
  Administered 2019-07-20: 90 mg via INTRAVENOUS
  Administered 2019-07-20: 40 mg via INTRAVENOUS
  Administered 2019-07-20: 30 mg via INTRAVENOUS

## 2019-07-20 MED ORDER — CEFAZOLIN SODIUM-DEXTROSE 1-4 GM/50ML-% IV SOLN
1.0000 g | Freq: Four times a day (QID) | INTRAVENOUS | Status: AC
  Administered 2019-07-20 – 2019-07-21 (×3): 1 g via INTRAVENOUS
  Filled 2019-07-20 (×3): qty 50

## 2019-07-20 MED ORDER — PANTOPRAZOLE SODIUM 40 MG PO TBEC
40.0000 mg | DELAYED_RELEASE_TABLET | Freq: Every day | ORAL | Status: DC
  Administered 2019-07-20 – 2019-07-21 (×2): 40 mg via ORAL
  Filled 2019-07-20 (×2): qty 1

## 2019-07-20 MED ORDER — OXYCODONE HCL 5 MG/5ML PO SOLN: 5.0000 mg | Freq: Once | ORAL | Status: DC | PRN

## 2019-07-20 MED ORDER — LACTATED RINGERS IV SOLN: INTRAVENOUS | Status: DC

## 2019-07-20 MED ORDER — SUGAMMADEX SODIUM 200 MG/2ML IV SOLN
INTRAVENOUS | Status: DC | PRN
  Administered 2019-07-20: 150 mg via INTRAVENOUS

## 2019-07-20 MED ORDER — ACETAMINOPHEN 160 MG/5ML PO SOLN: 325.0000 mg | ORAL | Status: DC | PRN

## 2019-07-20 MED ORDER — HYDROMORPHONE HCL 1 MG/ML IJ SOLN: 0.5000 mg | INTRAMUSCULAR | Status: DC | PRN

## 2019-07-20 MED ORDER — BUPIVACAINE HCL (PF) 0.25 % IJ SOLN
INTRAMUSCULAR | Status: AC
  Filled 2019-07-20: qty 30

## 2019-07-20 MED ORDER — FENTANYL CITRATE (PF) 100 MCG/2ML IJ SOLN
INTRAMUSCULAR | Status: AC
  Administered 2019-07-20: 75 ug via INTRAVENOUS
  Filled 2019-07-20: qty 2

## 2019-07-20 MED ORDER — 0.9 % SODIUM CHLORIDE (POUR BTL) OPTIME
TOPICAL | Status: DC | PRN
  Administered 2019-07-20: 1000 mL

## 2019-07-20 MED ORDER — FENTANYL CITRATE (PF) 100 MCG/2ML IJ SOLN: 25.0000 ug | INTRAMUSCULAR | Status: DC | PRN

## 2019-07-20 MED ORDER — ROCURONIUM BROMIDE 10 MG/ML (PF) SYRINGE
PREFILLED_SYRINGE | INTRAVENOUS | Status: DC | PRN
  Administered 2019-07-20: 40 mg via INTRAVENOUS
  Administered 2019-07-20: 5 mg via INTRAVENOUS

## 2019-07-20 MED ORDER — TRANEXAMIC ACID-NACL 1000-0.7 MG/100ML-% IV SOLN
1000.0000 mg | INTRAVENOUS | Status: AC
  Administered 2019-07-20: 1000 mg via INTRAVENOUS
  Filled 2019-07-20: qty 100

## 2019-07-20 SURGICAL SUPPLY — 75 items
BASEPLATE GLENOSPHERE 25 STD (Miscellaneous) ×1 IMPLANT
BASEPLATE GLENOSPHERE 25MM STD (Miscellaneous) ×1 IMPLANT
BIT DRILL 3.2 PERIPHERAL SCREW (BIT) ×2 IMPLANT
BLADE SAW SAG 29X58X.64 (BLADE) IMPLANT
BLADE SAW SAG 73X25 THK (BLADE)
BLADE SAW SGTL 73X25 THK (BLADE) IMPLANT
BONE SCREW THREAD 6.5X35MM (Screw) ×1 IMPLANT
CHLORAPREP W/TINT 26 (MISCELLANEOUS) ×6 IMPLANT
CLOSURE STERI-STRIP 1/2X4 (GAUZE/BANDAGES/DRESSINGS) ×1
CLSR STERI-STRIP ANTIMIC 1/2X4 (GAUZE/BANDAGES/DRESSINGS) ×2 IMPLANT
COOLER ICEMAN CLASSIC (MISCELLANEOUS) ×2 IMPLANT
COVER BACK TABLE 60X90IN (DRAPES) ×3 IMPLANT
COVER SURGICAL LIGHT HANDLE (MISCELLANEOUS) ×3 IMPLANT
COVER WAND RF STERILE (DRAPES) ×3 IMPLANT
DRAPE C-ARM 42X120 X-RAY (DRAPES) IMPLANT
DRAPE INCISE IOBAN 66X45 STRL (DRAPES) ×6 IMPLANT
DRAPE ORTHO SPLIT 77X108 STRL (DRAPES) ×6
DRAPE SHEET LG 3/4 BI-LAMINATE (DRAPES) ×6 IMPLANT
DRAPE SURG ORHT 6 SPLT 77X108 (DRAPES) ×2 IMPLANT
DRESSING AQUACEL AG SP 3.5X6 (GAUZE/BANDAGES/DRESSINGS) IMPLANT
DRSG AQUACEL AG ADV 3.5X 6 (GAUZE/BANDAGES/DRESSINGS) ×3 IMPLANT
DRSG AQUACEL AG SP 3.5X6 (GAUZE/BANDAGES/DRESSINGS) ×3
ELECT BLADE TIP CTD 4 INCH (ELECTRODE) ×3 IMPLANT
ELECT REM PT RETURN 15FT ADLT (MISCELLANEOUS) ×3 IMPLANT
GLENOSPHERE REV SHOULDER 36 (Joint) ×2 IMPLANT
GLOVE BIO SURGEON STRL SZ7 (GLOVE) ×6 IMPLANT
GLOVE BIOGEL PI IND STRL 7.0 (GLOVE) ×1 IMPLANT
GLOVE BIOGEL PI IND STRL 8 (GLOVE) ×1 IMPLANT
GLOVE BIOGEL PI INDICATOR 7.0 (GLOVE) ×2
GLOVE BIOGEL PI INDICATOR 8 (GLOVE) ×2
GLOVE ECLIPSE 8.0 STRL XLNG CF (GLOVE) ×6 IMPLANT
GOWN STRL REUS W/ TWL LRG LVL3 (GOWN DISPOSABLE) ×2 IMPLANT
GOWN STRL REUS W/TWL LRG LVL3 (GOWN DISPOSABLE) ×12 IMPLANT
GUIDEWIRE GLENOID 2.5X220 (WIRE) ×2 IMPLANT
HANDPIECE INTERPULSE COAX TIP (DISPOSABLE) ×3
IMPL REVERSE SHOULDER 0X3.5 (Shoulder) IMPLANT
IMPLANT REVERSE SHOULDER 0X3.5 (Shoulder) ×3 IMPLANT
INSERT HUMERAL 36X6MM 12.5DEG (Insert) ×2 IMPLANT
KIT BASIN OR (CUSTOM PROCEDURE TRAY) ×3 IMPLANT
KIT STABILIZATION SHOULDER (MISCELLANEOUS) ×3 IMPLANT
KIT TURNOVER KIT A (KITS) ×3 IMPLANT
MANIFOLD NEPTUNE II (INSTRUMENTS) ×3 IMPLANT
NDL HYPO 25X1 1.5 SAFETY (NEEDLE) IMPLANT
NDL MAYO CATGUT SZ4 TPR NDL (NEEDLE) ×1 IMPLANT
NEEDLE HYPO 25X1 1.5 SAFETY (NEEDLE) IMPLANT
NEEDLE MAYO CATGUT SZ4 (NEEDLE) ×3 IMPLANT
NS IRRIG 1000ML POUR BTL (IV SOLUTION) ×3 IMPLANT
PACK SHOULDER (CUSTOM PROCEDURE TRAY) ×3 IMPLANT
PAD COLD SHLDR WRAP-ON (PAD) ×2 IMPLANT
RESTRAINT HEAD UNIVERSAL NS (MISCELLANEOUS) ×3 IMPLANT
SCREW 5.0X38 SMALL F/PERFORM (Screw) ×2 IMPLANT
SCREW BONE THREAD 6.5X35 (Screw) ×1 IMPLANT
SCREW PERIPHERAL 5.0X34 (Screw) ×2 IMPLANT
SET HNDPC FAN SPRY TIP SCT (DISPOSABLE) ×1 IMPLANT
SLING ULTRA II S (ORTHOPEDIC SUPPLIES) ×2 IMPLANT
SMARTMIX MINI TOWER (MISCELLANEOUS) ×3
SPONGE LAP 18X18 RF (DISPOSABLE) IMPLANT
STEM HUMERAL 3B LONG 98 (Stem) IMPLANT
STEM HUMERAL SZ 3B LONG 98MM (Stem) ×3 IMPLANT
SUCTION FRAZIER HANDLE 12FR (TUBING) ×3
SUCTION TUBE FRAZIER 12FR DISP (TUBING) ×1 IMPLANT
SUT ETHIBOND 2 V 37 (SUTURE) ×3 IMPLANT
SUT ETHIBOND NAB CT1 #1 30IN (SUTURE) ×3 IMPLANT
SUT FIBERWIRE #5 38 CONV NDL (SUTURE)
SUT MNCRL AB 4-0 PS2 18 (SUTURE) ×3 IMPLANT
SUT VIC AB 0 CT1 36 (SUTURE) ×3 IMPLANT
SUT VIC AB 3-0 CT1 27 (SUTURE) ×3
SUT VIC AB 3-0 CT1 TAPERPNT 27 (SUTURE) ×1 IMPLANT
SUT VIC AB 3-0 SH 27 (SUTURE)
SUT VIC AB 3-0 SH 27X BRD (SUTURE) IMPLANT
SUTURE FIBERWR #5 38 CONV NDL (SUTURE) IMPLANT
TOWEL OR 17X26 10 PK STRL BLUE (TOWEL DISPOSABLE) ×3 IMPLANT
TOWER CARTRIDGE SMART MIX (DISPOSABLE) IMPLANT
TOWER SMARTMIX MINI (MISCELLANEOUS) ×1 IMPLANT
WATER STERILE IRR 1000ML POUR (IV SOLUTION) ×3 IMPLANT

## 2019-07-20 NOTE — Anesthesia Procedure Notes (Signed)
Procedure Name: Intubation Date/Time: 07/20/2019 11:30 AM Performed by: Silas [place], CRNA Pre-anesthesia Checklist: Patient identified, Emergency Drugs available, Suction available and Patient being monitored Patient Re-evaluated:Patient Re-evaluated prior to induction Oxygen Delivery Method: Circle system utilized Preoxygenation: Pre-oxygenation with 100% oxygen Induction Type: IV induction Ventilation: Mask ventilation without difficulty Laryngoscope Size: Mac and 4 Grade View: Grade I Tube type: Oral Tube size: 7.0 mm Number of attempts: 1 Airway Equipment and Method: Stylet Placement Confirmation: ETT inserted through vocal cords under direct vision,  positive ETCO2 and breath sounds checked- equal and bilateral Secured at: 22 cm Tube secured with: Tape Dental Injury: Teeth and Oropharynx as per pre-operative assessment

## 2019-07-20 NOTE — Transfer of Care (Signed)
Immediate Anesthesia Transfer of Care Note  Patient: Kimberly Ramirez  Procedure(s) Performed: TOTAL SHOULDER ARTHROPLASTY (Right Shoulder)  Patient Location: PACU  Anesthesia Type:GA combined with regional for post-op pain  Level of Consciousness: drowsy, patient cooperative and responds to stimulation  Airway & Oxygen Therapy: Patient Spontanous Breathing and Patient connected to face mask oxygen  Post-op Assessment: Report given to RN and Post -op Vital signs reviewed and stable  Post vital signs: Reviewed and stable  Last Vitals:  Vitals Value Taken Time  BP 98/61 07/20/19 1332  Temp    Pulse 71 07/20/19 1335  Resp 15 07/20/19 1335  SpO2 100 % 07/20/19 1335  Vitals shown include unvalidated device data.  Last Pain:  Vitals:   07/20/19 0922  TempSrc: Oral      Patients Stated Pain Goal: 3 ([id number])  Complications: No apparent anesthesia complications

## 2019-07-20 NOTE — Op Note (Signed)
Orthopaedic Surgery Operative Note (CSN: [id number])  Kimberly Ramirez  1927-07-03 Date of Surgery: 07/20/2019   Diagnoses:  Right proximal humerus fracture  Procedure: Right reverse total Shoulder Arthroplasty   Operative Finding Successful completion of planned procedure.  Patient's fracture was displaced and the shaft was anterior to the head with erosion of the head fragment consistent with the CT scan.  This would likely of gone on to nonunion without intervention.  We had good fixation of the stem though there was some posterior diaphyseal involvement.  Great fixation of the glenoid.  We are overall happy with the construct.  Nerve is intact on the tug test at the end of the case.  Post-operative plan: The patient will be NWB in sling.  The patient will be will be admitted to observation due to medical complexity, monitoring and pain management.  DVT prophylaxis Aspirin 81 mg twice daily for 6 weeks.  Pain control with PRN pain medication preferring oral medicines.  Follow up plan will be scheduled in approximately 7 days for incision check and XR.  Patient will be allowed to use her arm after 1 week for ambulation with a walker but replaced the sling afterwards for sleep.  Okay for light activities at waist level.  No forward elevation though.  Therapy to start after 4 weeks.  We remove the tuberosities due to the early motion needed by the patient however he we did do a robust repair of the cuff tissue for stability sake.  Implants: Longstem Tornier flex size 3, high offset 0 tray with 6 poly-, 36 standard glenosphere with 25 standard baseplate and 35 center screw.  Post-Op Diagnosis: Same Surgeons:Primary: Hiram Gash, MD Assistants:Caroline McBane PA-C Location: [facility] ROOM 06 Anesthesia: General with Exparel Interscalene Antibiotics: Ancef 2g preop, Vancomycin 1000mg  locally Tourniquet time: None Estimated Blood Loss: 123XX123 Complications: None Specimens: None Implants: Implant Name  Type Inv. Item Serial No. Manufacturer Lot No. LRB No. Used Action  BASEPLATE GLENOSPHERE [id number] STD - [id number] Miscellaneous BASEPLATE GLENOSPHERE [id number] STD  [employer] [id number] Right 1 Implanted  BONE SCREW THREAD 6.5X35MM - [id number] Screw BONE SCREW THREAD 6.5X35MM  [employer]  Right 1 Implanted  SCREW 5.0X38 SMALL F/PERFORM - [id number] Screw SCREW 5.0X38 SMALL F/PERFORM  [employer]  Right 1 Implanted  SCREW PERIPHERAL 5.0X34 - [id number] Screw SCREW PERIPHERAL 5.0X34  [employer]  Right 1 Implanted  GLENOSPHERE REV SHOULDER 36 - [id number] Joint GLENOSPHERE REV SHOULDER 36  [employer] [id number] Right 1 Implanted  STEM HUMERAL SZ 3B LONG 98MM - [id number] Stem STEM HUMERAL SZ 3B LONG 98MM  [employer] [id number] Right 1 Implanted  IMPLANT REVERSE SHOULDER 0X3.5 - [id number] Shoulder IMPLANT REVERSE SHOULDER 0X3.5  [employer] [id number] Right 1 Implanted  INSERT HUMERAL 36X6MM 12.5DEG - [id number] Insert INSERT HUMERAL 36X6MM 12.Tora Perches [facility] [id number] Right 1 Implanted    Indications for Surgery:   Kimberly Ramirez is a 86 y.o. female with fall resulting in a displaced right proximal humerus fracture which went on to displace further after initial nonoperative management.  We are worried about nonunion and the patient was mildly confused and the patient's family was worried that continuing to stay still in a sling may be difficult for her.  We did discuss at length that operative fixation may lead to a better chance of early range of motion and potentially better pain control with lower chance for need for intervention at a later date.  Benefits and risks of operative and nonoperative management  were discussed prior to surgery with patient/guardian(s) and informed consent form was completed.  Infection and need for further surgery were discussed as was prosthetic stability and cuff issues.  We additionally specifically discussed risks of axillary nerve injury, infection, periprosthetic fracture,  continued pain and longevity of implants prior to beginning procedure.      Procedure:   The patient was identified in the preoperative holding area where the surgical site was marked. Block placed by anesthesia with exparel.  The patient was taken to the OR where a procedural timeout was called and the above noted anesthesia was induced.  The patient was positioned beachchair on allen table with spider arm positioner.  Preoperative antibiotics were dosed.  The patient's right shoulder was prepped and draped in the usual sterile fashion.  A second preoperative timeout was called.       Standard deltopectoral approach was performed with a #10 blade. We dissected down to the subcutaneous tissues and the cephalic vein was taken laterally with the deltoid. Clavipectoral fascia was incised in line with the incision. Deep retractors were placed. The long of the biceps tendon was identified and there was significant tenosynovitis present.  Tenodesis was performed to the pectoralis tendon with #2 Ethibond. The remaining biceps was followed up into the rotator interval where it was released.   We used the bicipital groove as a landmark for the lesser and greater tuberosity fragments.  We were able to mobilize the lesser tuberosity fragment and placed stay sutures in the bone tendon junction to help with mobilization.  This point we were able to identify the  greater tuberosity fragment and 4 #5  FiberWire sutures were used to place into this for eventual repair of the tuberosities.  Once these were both mobilized we took care to identify the shaft fragment as well as the head fragment.  We carefully identified the head fragment were able to manually remove it.  At this point the axillary nerve was found and palpated and with a tug test noted to be intact.  Protected throughout the remainder of the case with blunt retractors.   We then released the SGHL with bovie cautery prior to placing a curved mayo at the  junction of the anterior glenoid well above the axillary nerve and bluntly dissecting the subscapularis from the capsule.  We then carefully protected the axillary nerve as we gently released the inferior capsule to fully mobilize the subscapularis.  An anterior deltoid retractor was then placed as well as a small Hohmann retractor superiorly.  The glenoid was relatively preserved as we would expect in this fracture patient.  The remaining labrum was removed circumferentially taking great care not to disrupt the posterior capsule.   The glenoid drill guide was placed and used to drill a guide pin in the center, inferior position. The glenoid face was then reamed concentrically over the guide wire. The center hole was drilled over the guidepin in a near anatomic angle of version. Next the glenoid vault was drilled back to a depth of 35 mm.  We tapped and then placed a 25 mm size baseplate with 0 lateralization was selected with a 35 mm x 6.5 mm length central screw.  The base plate was screwed into the glenoid vault obtaining secure fixation. We next placed superior and inferior locking screws for additional fixation.  Next a 36 mm glenosphere was selected and impacted onto the baseplate. The center screw was tightened.  We then repositioned the arm to give access  to the humeral shaft fragment.  Drill holes were placed and fiberwire sutures in the shaft for vertical fixation of the tuberosities.  We broached starting with a size one broach and broaching up to 3 long which obtained an appropriate fit above the pec and was solidly fixed.  We trialed with multiple size tray and polyethylene options and selected a 0 high which provided good stability and range of motion without excess soft tissue tension. The offset was dialed in to match the normal anatomy. The shoulder was trialed.  There was good ROM in all planes and the shoulder was stable with no inferior translation.  Due to the need to have the  patient use her arm early in the worry of avoidance of impingement of the tuberosities if they failed in a repair we elected to remove the tuberosities sharply from the stump of the rotator cuff.  This was done without issue.  We then mobilized her cuff again and placed the anterior deep limbs of the 4 #5 fiber wires around the stem.  1 of these was tied down fixing the greater tuberosity in place after bone graft harvest from the humeral head component was placed underneath.  A +0 high offset tray was selected and impacted onto the stem.   A 36+6 polyethylene liner was impacted onto the stem.  The joint was reduced and thoroughly irrigated with pulsatile lavage.  At this point the sutures were passed through the subscapularis and we were able to close the remaining cuff tissue around the implant to provide stability though there clearly was not bone graft placed underneath as we are not doing a true tuberosity repair. We irrigated copiously at this point.  Hemostasis was obtained. The deltopectoral interval was reapproximated with #1 Ethibond. The subcutaneous tissues were closed with 3-0 Vicryl and the skin was closed with running monocryl.    The wounds were cleaned and dried and an Aquacel dressing was placed. The drapes taken down. The arm was placed into sling with abduction pillow. Patient was awakened, extubated, and transferred to the recovery room in stable condition. There were no intraoperative complications. The sponge, needle, and attention counts were correct at the end of the case.     Noemi Chapel, PA-C, present and scrubbed throughout the case, critical for completion in a timely fashion, and for retraction, instrumentation, closure.

## 2019-07-20 NOTE — Progress Notes (Signed)
Called placed to 5W informing arrival of patient. Called returned requesting phone report, waited 5 minutes to apeak with RN, Leafy Ro, phone report given.

## 2019-07-20 NOTE — Progress Notes (Signed)
AssistedDr. Ambrose Pancoast with right, ultrasound guided, interscalene  block. Side rails up, monitors on throughout procedure. See vital signs in flow sheet. Tolerated Procedure well.

## 2019-07-20 NOTE — Interval H&P Note (Signed)
History and Physical Interval Note:  07/20/2019 9:36 AM  Kimberly Ramirez  has presented today for surgery, with the diagnosis of right proximal humerus fracture.  The various methods of treatment have been discussed with the patient and family. After consideration of risks, benefits and other options for treatment, the patient has consented to  Procedure(s): TOTAL SHOULDER ARTHROPLASTY (Right) as a surgical intervention.  The patient's history has been reviewed, patient examined, no change in status, stable for surgery.  I have reviewed the patient's chart and labs.  Questions were answered to the patient's satisfaction.     Hiram Gash

## 2019-07-20 NOTE — Anesthesia Procedure Notes (Addendum)
Anesthesia Regional Block: Interscalene brachial plexus block   Pre-Anesthetic Checklist: ,, timeout performed, Correct Patient, Correct Site, Correct Laterality, Correct Procedure, Correct Position, site marked, Risks and benefits discussed,  Surgical consent,  Pre-op evaluation,  At surgeon's request and post-op pain management  Laterality: Right  Prep: chloraprep       Needles:  Injection technique: Single-shot  Needle Type: Echogenic Stimulator Needle     Needle Length: 5cm  Needle Gauge: 22     Additional Needles:   Procedures:, nerve stimulator,,, ultrasound used (permanent image in chart),,,,   Nerve Stimulator or Paresthesia:  Response: hand, 0.45 mA,   Additional Responses:   Narrative:  Start time: 07/20/2019 10:30 AM End time: 07/20/2019 10:35 AM Injection made incrementally with aspirations every 5 mL.  Performed by: Personally  Anesthesiologist: Janeece Riggers, MD  Additional Notes: Functioning IV was confirmed and monitors were applied.  A 17mm 22ga Arrow echogenic stimulator needle was used. Sterile prep and drape,hand hygiene and sterile gloves were used. Ultrasound guidance: relevant anatomy identified, needle position confirmed, local anesthetic spread visualized around nerve(s)., vascular puncture avoided.  Image printed for medical record. Negative aspiration and negative test dose prior to incremental administration of local anesthetic. The patient tolerated the procedure well.

## 2019-07-21 DIAGNOSIS — Z8673 Personal history of transient ischemic attack (TIA), and cerebral infarction without residual deficits: Secondary | ICD-10-CM | POA: Diagnosis not present

## 2019-07-21 DIAGNOSIS — R000 Tachycardia, unspecified: Secondary | ICD-10-CM | POA: Diagnosis not present

## 2019-07-21 DIAGNOSIS — M65811 Other synovitis and tenosynovitis, right shoulder: Secondary | ICD-10-CM | POA: Diagnosis not present

## 2019-07-21 DIAGNOSIS — R410 Disorientation, unspecified: Secondary | ICD-10-CM | POA: Diagnosis not present

## 2019-07-21 DIAGNOSIS — I10 Essential (primary) hypertension: Secondary | ICD-10-CM | POA: Diagnosis not present

## 2019-07-21 DIAGNOSIS — E860 Dehydration: Secondary | ICD-10-CM | POA: Diagnosis not present

## 2019-07-21 DIAGNOSIS — S42211A Unspecified displaced fracture of surgical neck of right humerus, initial encounter for closed fracture: Secondary | ICD-10-CM | POA: Diagnosis not present

## 2019-07-21 DIAGNOSIS — E785 Hyperlipidemia, unspecified: Secondary | ICD-10-CM | POA: Diagnosis not present

## 2019-07-21 DIAGNOSIS — R52 Pain, unspecified: Secondary | ICD-10-CM | POA: Diagnosis not present

## 2019-07-21 DIAGNOSIS — R42 Dizziness and giddiness: Secondary | ICD-10-CM | POA: Diagnosis not present

## 2019-07-21 DIAGNOSIS — R55 Syncope and collapse: Secondary | ICD-10-CM | POA: Diagnosis not present

## 2019-07-21 MED ORDER — CLONIDINE HCL 0.1 MG PO TABS: 0.1000 mg | ORAL_TABLET | ORAL | Status: DC | PRN

## 2019-07-21 MED ORDER — CLONIDINE HCL 0.1 MG PO TABS: 0.1000 mg | ORAL_TABLET | Freq: Once | ORAL | Status: DC

## 2019-07-21 MED ORDER — CLONIDINE HCL 0.1 MG PO TABS
0.1000 mg | ORAL_TABLET | Freq: Once | ORAL | Status: AC
  Administered 2019-07-21: 0.1 mg via ORAL
  Filled 2019-07-21: qty 1

## 2019-07-21 MED ORDER — ACETAMINOPHEN 500 MG PO TABS: 1000.0000 mg | ORAL_TABLET | Freq: Three times a day (TID) | ORAL | 0 refills | Status: AC

## 2019-07-21 MED ORDER — LACTATED RINGERS IV SOLN: INTRAVENOUS | Status: DC

## 2019-07-21 MED ORDER — ONDANSETRON HCL 4 MG PO TABS: 4.0000 mg | ORAL_TABLET | Freq: Three times a day (TID) | ORAL | 1 refills | Status: AC | PRN

## 2019-07-21 MED ORDER — OXYCODONE HCL 5 MG PO TABS: ORAL_TABLET | ORAL | 0 refills | Status: AC

## 2019-07-21 NOTE — Anesthesia Postprocedure Evaluation (Signed)
Anesthesia Post Note  Patient: Kimberly Ramirez  Procedure(s) Performed: TOTAL SHOULDER ARTHROPLASTY (Right Shoulder)     Patient location during evaluation: PACU Anesthesia Type: General Level of consciousness: awake and alert Pain management: pain level controlled Vital Signs Assessment: post-procedure vital signs reviewed and stable Respiratory status: spontaneous breathing, nonlabored ventilation, respiratory function stable and patient connected to nasal cannula oxygen Cardiovascular status: blood pressure returned to baseline and stable Postop Assessment: no apparent nausea or vomiting Anesthetic complications: no    Last Vitals:  Vitals:   07/21/19 0716 07/21/19 0756  BP: (!) 100/51 92/64  Pulse: 83 74  Resp:    Temp:    SpO2:      Last Pain:  Vitals:   07/21/19 0554  TempSrc: Oral  PainSc:                  Kimberly Ramirez

## 2019-07-21 NOTE — Discharge Summary (Signed)
[name] MRN: [id number] DOB/AGE: 08/26/1927 86 y.o.  Admit date: 07/20/2019 Discharge date: 07/21/2019  Admission Diagnoses: Right proximal humerus fracture  Discharge Diagnoses:  Active Problems:   Closed fracture of right proximal humerus   Past Medical History:  Diagnosis Date  . Closed right hip fracture ([place]) 05/2017  . TIA (transient ischemic attack) 2019   Procedures Performed: Right reverse total shoulder arthroplasty   Discharged Condition: good/stable  Hospital Course: Patient brought in on 07/20/2019 for a reverse total shoulder arthroplasty. She tolerated procedure well.  She was kept for monitoring overnight for pain control and medical monitoring postop. Her blood pressure was elevated during the night. She was given clonidine. Blood pressure was stabilized. Patient was found to be stable for DC home the morning after surgery. The patient and her family were instructed to have close follow-up with her PCP regarding her blood pressure. Patient and her family were instructed on specific activity restrictions and all questions were answered.   Consults: None  Significant Diagnostic Studies: No additional pertinent studies  Treatments: Surgery  Discharge Exam: Right upper extremity: Dressing CDI and sling well fitting. Axillary nerve is firing. She is able to wiggle her fingers. She endorses distal sensation. Warm well perfused hand  Disposition: Discharge disposition: 01-Home or Self Care       Discharge Instructions    Call MD for:  redness, tenderness, or signs of infection (pain, swelling, redness, odor or green/yellow discharge around incision site)   Complete by: As directed    Call MD for:  severe uncontrolled pain   Complete by: As directed    Call MD for:  temperature >100.4   Complete by: As directed    Diet - low sodium heart healthy   Complete by: As directed    Discharge instructions   Complete by: As directed    Kimberly Charter MD,  MPH Noemi Chapel, PA-C [place]. [address] [phone] (tel)   [phone] (fax)   POST-OPERATIVE INSTRUCTIONS - TOTAL SHOULDER REPLACEMENT    WOUND CARE - You may leave the operative dressing in place until your follow-up appointment. - [place]. - There may be a small amount of fluid/bleeding leaking at the surgical site. This is normal after surgery.  - If it fills with liquid or blood please call us immediately to change it for you. U- se the provided ice machine or Ice packs as often as possible for the first 3-4 days, then as needed for pain relief.  Keep a layer of cloth or a shirt between your skin and the cooling unit to prevent frost bite as it can get very cold.  SHOWERING: - You may shower on Post-Op Day #2.  - The dressing is water resistant but do not scrub it as it may start to peel up.   - You may remove the sling for showering, but keep a water resistant pillow under the arm to keep both the  elbow and shoulder away from the body (mimicking the abduction sling).  - Gently pat the area dry.  - Do not soak the shoulder in water. Do not go swimming in the pool or ocean until your sutures are removed. - KEEP THE INCISIONS CLEAN AND DRY.  EXERCISES - Wear the sling at all times except when doing your exercises.  - You may remove the sling for showering, but keep the arm across the chest or in a secondary sling.     -  Accidental/Purposeful External Rotation and shoulder flexion (reaching behind you) is to be avoided at all costs for the first month. - It is ok to come out of your sling if your are sitting and have assistance for eating.  Do not lift anything heavier than 1 pound until we discuss it further in clinic.  Please perform the exercises:   - Elbow / Hand / Wrist  Range of Motion Exercises - Grip strengthening   REGIONAL ANESTHESIA (NERVE BLOCKS) The anesthesia team may have performed a nerve  block for you if safe in the setting of your care.  This is a great tool used to minimize pain.  Typically the block may start wearing off overnight but the long acting medicine may last for 3-4 days.  The nerve block wearing off can be a challenging period but please utilize your as needed pain medications to try and manage this period.    POST-OP MEDICATIONS- Multimodal approach to pain control In general your pain will be controlled with a combination of substances.  Prescriptions unless otherwise discussed are electronically sent to your pharmacy.  This is a carefully made plan we use to minimize narcotic use.     Acetaminophen - Non-narcotic pain medicine taken on a scheduled basis  Oxycodone - This is a strong narcotic, to be used only on an "as needed" basis for pain.Marland Kitchen Zofran -  take as needed for nausea   FOLLOW-UP If you develop a Fever (>101.5), Redness or Drainage from the surgical incision site, please call our office to arrange for an evaluation. Please call the office to schedule a follow-up appointment for a wound check, 7-10 days post-operatively.  IF YOU HAVE ANY QUESTIONS, PLEASE FEEL FREE TO CALL OUR OFFICE.  HELPFUL INFORMATION  If you had a block, it will wear off between 8-24 hrs postop typically.  This is period when your pain may go from nearly zero to the pain you would have had post-op without the block.  This is an abrupt transition but nothing dangerous is happening.  You may take an extra dose of narcotic when this happens.  Your arm will be in a sling following surgery. You will be in this sling for the next 3-4 weeks.  I will let you know the exact duration at your follow-up visit.  You may be more comfortable sleeping in a semi-seated position the first few nights following surgery.  Keep a pillow propped under the elbow and forearm for comfort.  If you have a recliner type of chair it might be beneficial.  If not that is fine too, but it would be helpful to  sleep propped up with pillows behind your operated shoulder as well under your elbow and forearm.  This will reduce pulling on the suture lines.  When dressing, put your operative arm in the sleeve first.  When getting undressed, take your operative arm out last.  Loose fitting, button-down shirts are recommended.  In most states it is against the law to drive while your arm is in a sling. And certainly against the law to drive while taking narcotics.  You may return to work/school in the next couple of days when you feel up to it. Desk work and typing in the sling is fine.  We suggest you use the pain medication the first night prior to going to bed, in order to ease any pain when the anesthesia wears off. You should avoid taking pain medications on an empty stomach as it will  make you nauseous.  Do not drink alcoholic beverages or take illicit drugs when taking pain medications.  Pain medication may make you constipated.  Below are a few solutions to try in this order: Decrease the amount of pain medication if you aren't having pain. Drink lots of decaffeinated fluids. Drink prune juice and/or each dried prunes  If the first 3 don't work start with additional solutions Take Colace - an over-the-counter stool softener Take Senokot - an over-the-counter laxative Take Miralax - a stronger over-the-counter laxative   Dental Antibiotics:  In most cases prophylactic antibiotics for Dental procdeures after total joint surgery are not necessary.  Exceptions are as follows:  1. History of prior total joint infection  2. Severely immunocompromised (Organ Transplant, cancer chemotherapy, Rheumatoid biologic meds such as [place])  3. Poorly controlled diabetes (A1C > 8.0, blood glucose over 200)  If you have one of these conditions, contact your surgeon for an antibiotic prescription, prior to your dental procedure.     Allergies as of 07/21/2019   No Known Allergies     Medication  List    STOP taking these medications   HYDROcodone-acetaminophen 5-325 MG tablet Commonly known as: NORCO/VICODIN     TAKE these medications   acetaminophen 500 MG tablet Commonly known as: TYLENOL Take 2 tablets (1,000 mg total) by mouth every 8 (eight) hours for 14 days. What changed:   when to take this  reasons to take this   ondansetron 4 MG tablet Commonly known as: Zofran Take 1 tablet (4 mg total) by mouth every 8 (eight) hours as needed for up to 7 days for nausea or vomiting.   oxyCODONE 5 MG immediate release tablet Commonly known as: Oxy IR/ROXICODONE Take 0.1 - 1 pill every 6 hrs as needed for pain, no more than 6 per day

## 2019-07-21 NOTE — TOC Transition Note (Signed)
Transition of Care [facility]) - CM/SW Discharge Note   Patient Details  Name: Kimberly Ramirez MRN: [id number] Date of Birth: 20-Jan-1928  [facility] CM/SW Contact:  Lennart Pall, LCSW Phone Number: 07/21/2019, 10:01 AM   Clinical Narrative:   Met with pt and daughter to review any potential d/c needs. Both confirm that pt will return to her apt at [facility] and daughter has arranged private duty CNA coverage via [employer].  Per PA, plan to delay any start in therapy follow up for one month.  Pt and daughter in agreement with plan.  No needs identified.    Final next level of care: Home/Self Care Barriers to Discharge: No Barriers Identified   Patient Goals and CMS Choice Patient states their goals for this hospitalization and ongoing recovery are:: return to [place] apt at [facility]      Discharge Placement                       Discharge Plan and Services                DME Arranged: N/A DME Agency: NA       HH Arranged: NA HH Agency: NA        Social Determinants of Health (SDOH) Interventions     Readmission Risk Interventions No flowsheet data found.

## 2019-07-21 NOTE — Progress Notes (Signed)
   ORTHOPAEDIC PROGRESS NOTE  s/p Procedure(s): RIGHT REVERSE TOTAL SHOULDER ARTHROPLASTY on 07/20/2019 by Dr. Griffin Basil  SUBJECTIVE: Patient is sitting up in her hospital bed. She has no complaints. She is pleasantly confused. No chest pain. No SOB. No nausea/vomiting. No other complaints.  Per nursing staff, blood pressure was elevated last night. Systolic BP [id number] mmHg. Patient was given clonidine around 3:30 am. With recheck this morning, blood pressure had gone down.   OBJECTIVE: PE: Right upper extremity: Dressing CDI and sling well fitting. Axillary nerve is firing. She is able to wiggle her fingers. She endorses distal sensation. Warm well perfused hand   Vitals:   07/21/19 0716 07/21/19 0756  BP: (!) 100/51 92/64  Pulse: 83 74  Resp:    Temp:    SpO2:     IMAGING: Stable post-op imaging  ASSESSMENT: Kimberly Ramirez is a 86 y.o. female status post right reverse total shoulder. POD#1  PLAN: Weightbearing: NWB RUE Insicional and dressing care: Reinforce dressings as needed Orthopedic device(s): Sling Showering: Post-op day # 2 with assistance VTE prophylaxis: SCDs. Pain control: PRN pain medications. Preferring oral medications Follow - up plan: 1 week in office for incision check and x-ray Contact information:  Dr. Ophelia Charter, Noemi Chapel PA-C  Dispo: Discussed with patient's family (daughter will be at home to provide 24 hour assistance and son-in-law is a physician), goal is for patient to be discharged home today. We will monitor her blood pressure for the next few hours to ensure she does not drop too low. Once blood pressure stabilizes she will be discharged home with her family. Her family was instructed to have close follow-up with PCP this week for evaluation and to discuss her blood pressure. Patient's family would like her to be discharged home and feel comfortable taking care of her.   Noemi Chapel, PA-C 07/21/2019

## 2019-07-21 NOTE — Progress Notes (Signed)
Patient continues to have high blood pressure, no c/o distress and pain at this time; paged provider on call, Patrecia Pace and ordered to recheck BP after an hour at 0000000; if systolic remains [id number] to give 0.1mg  clonidine tab x1; will continue to montior pt.

## 2019-07-22 ENCOUNTER — Encounter: Payer: Self-pay | Admitting: *Deleted

## 2019-07-25 DIAGNOSIS — Z1159 Encounter for screening for other viral diseases: Secondary | ICD-10-CM | POA: Diagnosis not present

## 2019-07-25 DIAGNOSIS — Z7189 Other specified counseling: Secondary | ICD-10-CM | POA: Diagnosis not present

## 2019-07-25 DIAGNOSIS — G301 Alzheimer's disease with late onset: Secondary | ICD-10-CM | POA: Diagnosis not present

## 2019-07-25 DIAGNOSIS — I10 Essential (primary) hypertension: Secondary | ICD-10-CM | POA: Diagnosis not present

## 2019-07-25 DIAGNOSIS — Z20828 Contact with and (suspected) exposure to other viral communicable diseases: Secondary | ICD-10-CM | POA: Diagnosis not present

## 2019-07-26 DIAGNOSIS — M19011 Primary osteoarthritis, right shoulder: Secondary | ICD-10-CM | POA: Diagnosis not present

## 2019-08-01 DIAGNOSIS — Z1159 Encounter for screening for other viral diseases: Secondary | ICD-10-CM | POA: Diagnosis not present

## 2019-08-01 DIAGNOSIS — Z20828 Contact with and (suspected) exposure to other viral communicable diseases: Secondary | ICD-10-CM | POA: Diagnosis not present

## 2019-08-04 DIAGNOSIS — Z1159 Encounter for screening for other viral diseases: Secondary | ICD-10-CM | POA: Diagnosis not present

## 2019-08-04 DIAGNOSIS — Z20828 Contact with and (suspected) exposure to other viral communicable diseases: Secondary | ICD-10-CM | POA: Diagnosis not present

## 2019-08-08 DIAGNOSIS — Z1159 Encounter for screening for other viral diseases: Secondary | ICD-10-CM | POA: Diagnosis not present

## 2019-08-08 DIAGNOSIS — Z20828 Contact with and (suspected) exposure to other viral communicable diseases: Secondary | ICD-10-CM | POA: Diagnosis not present

## 2019-08-11 DIAGNOSIS — Z1159 Encounter for screening for other viral diseases: Secondary | ICD-10-CM | POA: Diagnosis not present

## 2019-08-11 DIAGNOSIS — Z20828 Contact with and (suspected) exposure to other viral communicable diseases: Secondary | ICD-10-CM | POA: Diagnosis not present

## 2019-08-15 DIAGNOSIS — Z1159 Encounter for screening for other viral diseases: Secondary | ICD-10-CM | POA: Diagnosis not present

## 2019-08-15 DIAGNOSIS — Z20828 Contact with and (suspected) exposure to other viral communicable diseases: Secondary | ICD-10-CM | POA: Diagnosis not present

## 2019-08-18 DIAGNOSIS — Z20828 Contact with and (suspected) exposure to other viral communicable diseases: Secondary | ICD-10-CM | POA: Diagnosis not present

## 2019-08-18 DIAGNOSIS — Z1159 Encounter for screening for other viral diseases: Secondary | ICD-10-CM | POA: Diagnosis not present

## 2019-08-22 DIAGNOSIS — Z1159 Encounter for screening for other viral diseases: Secondary | ICD-10-CM | POA: Diagnosis not present

## 2019-08-22 DIAGNOSIS — Z20828 Contact with and (suspected) exposure to other viral communicable diseases: Secondary | ICD-10-CM | POA: Diagnosis not present

## 2019-08-23 DIAGNOSIS — M19011 Primary osteoarthritis, right shoulder: Secondary | ICD-10-CM | POA: Diagnosis not present

## 2019-08-25 DIAGNOSIS — M25511 Pain in right shoulder: Secondary | ICD-10-CM | POA: Diagnosis not present

## 2019-08-25 DIAGNOSIS — M6281 Muscle weakness (generalized): Secondary | ICD-10-CM | POA: Diagnosis not present

## 2019-08-25 DIAGNOSIS — R2681 Unsteadiness on feet: Secondary | ICD-10-CM | POA: Diagnosis not present

## 2019-08-25 DIAGNOSIS — Z1159 Encounter for screening for other viral diseases: Secondary | ICD-10-CM | POA: Diagnosis not present

## 2019-08-25 DIAGNOSIS — R278 Other lack of coordination: Secondary | ICD-10-CM | POA: Diagnosis not present

## 2019-08-25 DIAGNOSIS — R262 Difficulty in walking, not elsewhere classified: Secondary | ICD-10-CM | POA: Diagnosis not present

## 2019-08-25 DIAGNOSIS — Z20828 Contact with and (suspected) exposure to other viral communicable diseases: Secondary | ICD-10-CM | POA: Diagnosis not present

## 2019-08-26 DIAGNOSIS — M25511 Pain in right shoulder: Secondary | ICD-10-CM | POA: Diagnosis not present

## 2019-08-26 DIAGNOSIS — R278 Other lack of coordination: Secondary | ICD-10-CM | POA: Diagnosis not present

## 2019-08-26 DIAGNOSIS — R2681 Unsteadiness on feet: Secondary | ICD-10-CM | POA: Diagnosis not present

## 2019-08-26 DIAGNOSIS — R262 Difficulty in walking, not elsewhere classified: Secondary | ICD-10-CM | POA: Diagnosis not present

## 2019-08-26 DIAGNOSIS — M6281 Muscle weakness (generalized): Secondary | ICD-10-CM | POA: Diagnosis not present

## 2019-08-29 DIAGNOSIS — R262 Difficulty in walking, not elsewhere classified: Secondary | ICD-10-CM | POA: Diagnosis not present

## 2019-08-29 DIAGNOSIS — Z1159 Encounter for screening for other viral diseases: Secondary | ICD-10-CM | POA: Diagnosis not present

## 2019-08-29 DIAGNOSIS — R278 Other lack of coordination: Secondary | ICD-10-CM | POA: Diagnosis not present

## 2019-08-29 DIAGNOSIS — R2681 Unsteadiness on feet: Secondary | ICD-10-CM | POA: Diagnosis not present

## 2019-08-29 DIAGNOSIS — M25511 Pain in right shoulder: Secondary | ICD-10-CM | POA: Diagnosis not present

## 2019-08-29 DIAGNOSIS — M6281 Muscle weakness (generalized): Secondary | ICD-10-CM | POA: Diagnosis not present

## 2019-08-29 DIAGNOSIS — Z20828 Contact with and (suspected) exposure to other viral communicable diseases: Secondary | ICD-10-CM | POA: Diagnosis not present

## 2019-08-30 DIAGNOSIS — R2681 Unsteadiness on feet: Secondary | ICD-10-CM | POA: Diagnosis not present

## 2019-08-30 DIAGNOSIS — R278 Other lack of coordination: Secondary | ICD-10-CM | POA: Diagnosis not present

## 2019-08-30 DIAGNOSIS — M25511 Pain in right shoulder: Secondary | ICD-10-CM | POA: Diagnosis not present

## 2019-08-30 DIAGNOSIS — M6281 Muscle weakness (generalized): Secondary | ICD-10-CM | POA: Diagnosis not present

## 2019-08-30 DIAGNOSIS — R262 Difficulty in walking, not elsewhere classified: Secondary | ICD-10-CM | POA: Diagnosis not present

## 2019-08-31 DIAGNOSIS — M6281 Muscle weakness (generalized): Secondary | ICD-10-CM | POA: Diagnosis not present

## 2019-08-31 DIAGNOSIS — R262 Difficulty in walking, not elsewhere classified: Secondary | ICD-10-CM | POA: Diagnosis not present

## 2019-08-31 DIAGNOSIS — R278 Other lack of coordination: Secondary | ICD-10-CM | POA: Diagnosis not present

## 2019-08-31 DIAGNOSIS — M25511 Pain in right shoulder: Secondary | ICD-10-CM | POA: Diagnosis not present

## 2019-08-31 DIAGNOSIS — R2681 Unsteadiness on feet: Secondary | ICD-10-CM | POA: Diagnosis not present

## 2019-09-01 DIAGNOSIS — R278 Other lack of coordination: Secondary | ICD-10-CM | POA: Diagnosis not present

## 2019-09-01 DIAGNOSIS — R2681 Unsteadiness on feet: Secondary | ICD-10-CM | POA: Diagnosis not present

## 2019-09-01 DIAGNOSIS — M25511 Pain in right shoulder: Secondary | ICD-10-CM | POA: Diagnosis not present

## 2019-09-01 DIAGNOSIS — Z1159 Encounter for screening for other viral diseases: Secondary | ICD-10-CM | POA: Diagnosis not present

## 2019-09-01 DIAGNOSIS — M6281 Muscle weakness (generalized): Secondary | ICD-10-CM | POA: Diagnosis not present

## 2019-09-01 DIAGNOSIS — R262 Difficulty in walking, not elsewhere classified: Secondary | ICD-10-CM | POA: Diagnosis not present

## 2019-09-01 DIAGNOSIS — Z20828 Contact with and (suspected) exposure to other viral communicable diseases: Secondary | ICD-10-CM | POA: Diagnosis not present

## 2019-09-02 DIAGNOSIS — N390 Urinary tract infection, site not specified: Secondary | ICD-10-CM | POA: Diagnosis not present

## 2019-09-02 DIAGNOSIS — R2681 Unsteadiness on feet: Secondary | ICD-10-CM | POA: Diagnosis not present

## 2019-09-02 DIAGNOSIS — M6281 Muscle weakness (generalized): Secondary | ICD-10-CM | POA: Diagnosis not present

## 2019-09-02 DIAGNOSIS — R278 Other lack of coordination: Secondary | ICD-10-CM | POA: Diagnosis not present

## 2019-09-02 DIAGNOSIS — R262 Difficulty in walking, not elsewhere classified: Secondary | ICD-10-CM | POA: Diagnosis not present

## 2019-09-02 DIAGNOSIS — M25511 Pain in right shoulder: Secondary | ICD-10-CM | POA: Diagnosis not present

## 2019-09-05 DIAGNOSIS — M25511 Pain in right shoulder: Secondary | ICD-10-CM | POA: Diagnosis not present

## 2019-09-05 DIAGNOSIS — R262 Difficulty in walking, not elsewhere classified: Secondary | ICD-10-CM | POA: Diagnosis not present

## 2019-09-05 DIAGNOSIS — Z20828 Contact with and (suspected) exposure to other viral communicable diseases: Secondary | ICD-10-CM | POA: Diagnosis not present

## 2019-09-05 DIAGNOSIS — R278 Other lack of coordination: Secondary | ICD-10-CM | POA: Diagnosis not present

## 2019-09-05 DIAGNOSIS — Z1159 Encounter for screening for other viral diseases: Secondary | ICD-10-CM | POA: Diagnosis not present

## 2019-09-05 DIAGNOSIS — R2681 Unsteadiness on feet: Secondary | ICD-10-CM | POA: Diagnosis not present

## 2019-09-05 DIAGNOSIS — M6281 Muscle weakness (generalized): Secondary | ICD-10-CM | POA: Diagnosis not present

## 2019-09-06 DIAGNOSIS — G301 Alzheimer's disease with late onset: Secondary | ICD-10-CM | POA: Diagnosis not present

## 2019-09-06 DIAGNOSIS — R531 Weakness: Secondary | ICD-10-CM | POA: Diagnosis not present

## 2019-09-06 DIAGNOSIS — F0280 Dementia in other diseases classified elsewhere without behavioral disturbance: Secondary | ICD-10-CM | POA: Diagnosis not present

## 2019-09-06 DIAGNOSIS — I10 Essential (primary) hypertension: Secondary | ICD-10-CM | POA: Diagnosis not present

## 2019-09-07 DIAGNOSIS — R2681 Unsteadiness on feet: Secondary | ICD-10-CM | POA: Diagnosis not present

## 2019-09-07 DIAGNOSIS — R278 Other lack of coordination: Secondary | ICD-10-CM | POA: Diagnosis not present

## 2019-09-07 DIAGNOSIS — R262 Difficulty in walking, not elsewhere classified: Secondary | ICD-10-CM | POA: Diagnosis not present

## 2019-09-07 DIAGNOSIS — M6281 Muscle weakness (generalized): Secondary | ICD-10-CM | POA: Diagnosis not present

## 2019-09-07 DIAGNOSIS — M25511 Pain in right shoulder: Secondary | ICD-10-CM | POA: Diagnosis not present

## 2019-09-08 DIAGNOSIS — R2681 Unsteadiness on feet: Secondary | ICD-10-CM | POA: Diagnosis not present

## 2019-09-08 DIAGNOSIS — R262 Difficulty in walking, not elsewhere classified: Secondary | ICD-10-CM | POA: Diagnosis not present

## 2019-09-08 DIAGNOSIS — R278 Other lack of coordination: Secondary | ICD-10-CM | POA: Diagnosis not present

## 2019-09-08 DIAGNOSIS — M25511 Pain in right shoulder: Secondary | ICD-10-CM | POA: Diagnosis not present

## 2019-09-08 DIAGNOSIS — Z20828 Contact with and (suspected) exposure to other viral communicable diseases: Secondary | ICD-10-CM | POA: Diagnosis not present

## 2019-09-08 DIAGNOSIS — M6281 Muscle weakness (generalized): Secondary | ICD-10-CM | POA: Diagnosis not present

## 2019-09-08 DIAGNOSIS — Z1159 Encounter for screening for other viral diseases: Secondary | ICD-10-CM | POA: Diagnosis not present

## 2019-09-09 DIAGNOSIS — R2681 Unsteadiness on feet: Secondary | ICD-10-CM | POA: Diagnosis not present

## 2019-09-09 DIAGNOSIS — R278 Other lack of coordination: Secondary | ICD-10-CM | POA: Diagnosis not present

## 2019-09-09 DIAGNOSIS — M25511 Pain in right shoulder: Secondary | ICD-10-CM | POA: Diagnosis not present

## 2019-09-09 DIAGNOSIS — R262 Difficulty in walking, not elsewhere classified: Secondary | ICD-10-CM | POA: Diagnosis not present

## 2019-09-09 DIAGNOSIS — M6281 Muscle weakness (generalized): Secondary | ICD-10-CM | POA: Diagnosis not present

## 2019-09-12 DIAGNOSIS — R2681 Unsteadiness on feet: Secondary | ICD-10-CM | POA: Diagnosis not present

## 2019-09-12 DIAGNOSIS — M6281 Muscle weakness (generalized): Secondary | ICD-10-CM | POA: Diagnosis not present

## 2019-09-12 DIAGNOSIS — R262 Difficulty in walking, not elsewhere classified: Secondary | ICD-10-CM | POA: Diagnosis not present

## 2019-09-12 DIAGNOSIS — R278 Other lack of coordination: Secondary | ICD-10-CM | POA: Diagnosis not present

## 2019-09-12 DIAGNOSIS — M25511 Pain in right shoulder: Secondary | ICD-10-CM | POA: Diagnosis not present

## 2019-10-13 DIAGNOSIS — Z1159 Encounter for screening for other viral diseases: Secondary | ICD-10-CM | POA: Diagnosis not present

## 2019-10-13 DIAGNOSIS — Z20828 Contact with and (suspected) exposure to other viral communicable diseases: Secondary | ICD-10-CM | POA: Diagnosis not present

## 2019-10-14 DIAGNOSIS — M6281 Muscle weakness (generalized): Secondary | ICD-10-CM | POA: Diagnosis not present

## 2019-10-14 DIAGNOSIS — R262 Difficulty in walking, not elsewhere classified: Secondary | ICD-10-CM | POA: Diagnosis not present

## 2019-10-14 DIAGNOSIS — R278 Other lack of coordination: Secondary | ICD-10-CM | POA: Diagnosis not present

## 2019-10-14 DIAGNOSIS — M25511 Pain in right shoulder: Secondary | ICD-10-CM | POA: Diagnosis not present

## 2019-10-14 DIAGNOSIS — R2681 Unsteadiness on feet: Secondary | ICD-10-CM | POA: Diagnosis not present

## 2019-10-17 DIAGNOSIS — R262 Difficulty in walking, not elsewhere classified: Secondary | ICD-10-CM | POA: Diagnosis not present

## 2019-10-17 DIAGNOSIS — M6281 Muscle weakness (generalized): Secondary | ICD-10-CM | POA: Diagnosis not present

## 2019-10-17 DIAGNOSIS — R278 Other lack of coordination: Secondary | ICD-10-CM | POA: Diagnosis not present

## 2019-10-17 DIAGNOSIS — R2681 Unsteadiness on feet: Secondary | ICD-10-CM | POA: Diagnosis not present

## 2019-10-17 DIAGNOSIS — M25511 Pain in right shoulder: Secondary | ICD-10-CM | POA: Diagnosis not present

## 2019-10-17 DIAGNOSIS — Z1159 Encounter for screening for other viral diseases: Secondary | ICD-10-CM | POA: Diagnosis not present

## 2019-10-17 DIAGNOSIS — Z20828 Contact with and (suspected) exposure to other viral communicable diseases: Secondary | ICD-10-CM | POA: Diagnosis not present

## 2019-10-18 DIAGNOSIS — M25511 Pain in right shoulder: Secondary | ICD-10-CM | POA: Diagnosis not present

## 2019-10-18 DIAGNOSIS — M6281 Muscle weakness (generalized): Secondary | ICD-10-CM | POA: Diagnosis not present

## 2019-10-18 DIAGNOSIS — R2681 Unsteadiness on feet: Secondary | ICD-10-CM | POA: Diagnosis not present

## 2019-10-18 DIAGNOSIS — R262 Difficulty in walking, not elsewhere classified: Secondary | ICD-10-CM | POA: Diagnosis not present

## 2019-10-18 DIAGNOSIS — R278 Other lack of coordination: Secondary | ICD-10-CM | POA: Diagnosis not present

## 2019-10-20 DIAGNOSIS — R262 Difficulty in walking, not elsewhere classified: Secondary | ICD-10-CM | POA: Diagnosis not present

## 2019-10-20 DIAGNOSIS — M25511 Pain in right shoulder: Secondary | ICD-10-CM | POA: Diagnosis not present

## 2019-10-20 DIAGNOSIS — R278 Other lack of coordination: Secondary | ICD-10-CM | POA: Diagnosis not present

## 2019-10-20 DIAGNOSIS — Z1159 Encounter for screening for other viral diseases: Secondary | ICD-10-CM | POA: Diagnosis not present

## 2019-10-20 DIAGNOSIS — R2681 Unsteadiness on feet: Secondary | ICD-10-CM | POA: Diagnosis not present

## 2019-10-20 DIAGNOSIS — Z20828 Contact with and (suspected) exposure to other viral communicable diseases: Secondary | ICD-10-CM | POA: Diagnosis not present

## 2019-10-20 DIAGNOSIS — M6281 Muscle weakness (generalized): Secondary | ICD-10-CM | POA: Diagnosis not present

## 2019-10-20 DIAGNOSIS — N390 Urinary tract infection, site not specified: Secondary | ICD-10-CM | POA: Diagnosis not present

## 2019-10-21 DIAGNOSIS — R262 Difficulty in walking, not elsewhere classified: Secondary | ICD-10-CM | POA: Diagnosis not present

## 2019-10-21 DIAGNOSIS — R278 Other lack of coordination: Secondary | ICD-10-CM | POA: Diagnosis not present

## 2019-10-21 DIAGNOSIS — M6281 Muscle weakness (generalized): Secondary | ICD-10-CM | POA: Diagnosis not present

## 2019-10-21 DIAGNOSIS — R2681 Unsteadiness on feet: Secondary | ICD-10-CM | POA: Diagnosis not present

## 2019-10-21 DIAGNOSIS — M25511 Pain in right shoulder: Secondary | ICD-10-CM | POA: Diagnosis not present

## 2019-10-24 DIAGNOSIS — Z1159 Encounter for screening for other viral diseases: Secondary | ICD-10-CM | POA: Diagnosis not present

## 2019-10-24 DIAGNOSIS — Z20828 Contact with and (suspected) exposure to other viral communicable diseases: Secondary | ICD-10-CM | POA: Diagnosis not present

## 2019-10-24 DIAGNOSIS — M6281 Muscle weakness (generalized): Secondary | ICD-10-CM | POA: Diagnosis not present

## 2019-10-24 DIAGNOSIS — R262 Difficulty in walking, not elsewhere classified: Secondary | ICD-10-CM | POA: Diagnosis not present

## 2019-10-24 DIAGNOSIS — R2681 Unsteadiness on feet: Secondary | ICD-10-CM | POA: Diagnosis not present

## 2019-10-24 DIAGNOSIS — M25511 Pain in right shoulder: Secondary | ICD-10-CM | POA: Diagnosis not present

## 2019-10-24 DIAGNOSIS — R278 Other lack of coordination: Secondary | ICD-10-CM | POA: Diagnosis not present

## 2019-10-25 ENCOUNTER — Emergency Department (HOSPITAL_COMMUNITY)
Admission: EM | Admit: 2019-10-25 | Discharge: 2019-10-25 | Disposition: A | Discharge status: Home / Self Care | Payer: Medicare Other | Attending: Emergency Medicine | Admitting: Emergency Medicine

## 2019-10-25 ENCOUNTER — Encounter (HOSPITAL_COMMUNITY): Payer: Self-pay | Admitting: Emergency Medicine

## 2019-10-25 ENCOUNTER — Other Ambulatory Visit: Payer: Self-pay

## 2019-10-25 ENCOUNTER — Emergency Department (HOSPITAL_COMMUNITY): Payer: Medicare Other

## 2019-10-25 DIAGNOSIS — R278 Other lack of coordination: Secondary | ICD-10-CM | POA: Diagnosis not present

## 2019-10-25 DIAGNOSIS — T368X5A Adverse effect of other systemic antibiotics, initial encounter: Secondary | ICD-10-CM | POA: Diagnosis not present

## 2019-10-25 DIAGNOSIS — I959 Hypotension, unspecified: Secondary | ICD-10-CM | POA: Diagnosis not present

## 2019-10-25 DIAGNOSIS — R5381 Other malaise: Secondary | ICD-10-CM | POA: Diagnosis not present

## 2019-10-25 DIAGNOSIS — F0280 Dementia in other diseases classified elsewhere without behavioral disturbance: Secondary | ICD-10-CM | POA: Insufficient documentation

## 2019-10-25 DIAGNOSIS — E860 Dehydration: Secondary | ICD-10-CM | POA: Insufficient documentation

## 2019-10-25 DIAGNOSIS — R000 Tachycardia, unspecified: Secondary | ICD-10-CM | POA: Diagnosis not present

## 2019-10-25 DIAGNOSIS — J189 Pneumonia, unspecified organism: Secondary | ICD-10-CM | POA: Diagnosis not present

## 2019-10-25 DIAGNOSIS — M6281 Muscle weakness (generalized): Secondary | ICD-10-CM | POA: Diagnosis not present

## 2019-10-25 DIAGNOSIS — T50905A Adverse effect of unspecified drugs, medicaments and biological substances, initial encounter: Secondary | ICD-10-CM | POA: Diagnosis not present

## 2019-10-25 DIAGNOSIS — R531 Weakness: Secondary | ICD-10-CM | POA: Diagnosis present

## 2019-10-25 DIAGNOSIS — I10 Essential (primary) hypertension: Secondary | ICD-10-CM | POA: Insufficient documentation

## 2019-10-25 DIAGNOSIS — M25511 Pain in right shoulder: Secondary | ICD-10-CM | POA: Diagnosis not present

## 2019-10-25 DIAGNOSIS — R197 Diarrhea, unspecified: Secondary | ICD-10-CM | POA: Diagnosis not present

## 2019-10-25 DIAGNOSIS — R112 Nausea with vomiting, unspecified: Secondary | ICD-10-CM | POA: Insufficient documentation

## 2019-10-25 DIAGNOSIS — G309 Alzheimer's disease, unspecified: Secondary | ICD-10-CM | POA: Diagnosis not present

## 2019-10-25 DIAGNOSIS — N3090 Cystitis, unspecified without hematuria: Secondary | ICD-10-CM

## 2019-10-25 DIAGNOSIS — R262 Difficulty in walking, not elsewhere classified: Secondary | ICD-10-CM | POA: Diagnosis not present

## 2019-10-25 DIAGNOSIS — N3000 Acute cystitis without hematuria: Secondary | ICD-10-CM | POA: Insufficient documentation

## 2019-10-25 DIAGNOSIS — R2681 Unsteadiness on feet: Secondary | ICD-10-CM | POA: Diagnosis not present

## 2019-10-25 HISTORY — DX: Essential (primary) hypertension: I10

## 2019-10-25 HISTORY — DX: Dementia in other diseases classified elsewhere, unspecified severity, without behavioral disturbance, psychotic disturbance, mood disturbance, and anxiety: F02.80

## 2019-10-25 LAB — CBC
HCT: 39.6 % (ref 36.0–46.0)
Hemoglobin: 12.5 g/dL (ref 12.0–15.0)
MCH: 30.6 pg (ref 26.0–34.0)
MCHC: 31.6 g/dL (ref 30.0–36.0)
MCV: 97.1 fL (ref 80.0–100.0)
Platelets: 186 10*3/uL (ref 150–400)
RBC: 4.08 MIL/uL (ref 3.87–5.11)
RDW: 12.4 % (ref 11.5–15.5)
WBC: 15 10*3/uL — ABNORMAL HIGH (ref 4.0–10.5)
nRBC: 0 % (ref 0.0–0.2)

## 2019-10-25 LAB — URINALYSIS, ROUTINE W REFLEX MICROSCOPIC
Bilirubin Urine: NEGATIVE
Glucose, UA: NEGATIVE mg/dL
Hgb urine dipstick: NEGATIVE
Ketones, ur: NEGATIVE mg/dL
Nitrite: NEGATIVE
Protein, ur: NEGATIVE mg/dL
Specific Gravity, Urine: 1.014 (ref 1.005–1.030)
pH: 5 (ref 5.0–8.0)

## 2019-10-25 LAB — BASIC METABOLIC PANEL
Anion gap: 13 (ref 5–15)
BUN: 17 mg/dL (ref 8–23)
CO2: 22 mmol/L (ref 22–32)
Calcium: 9 mg/dL (ref 8.9–10.3)
Chloride: 103 mmol/L (ref 98–111)
Creatinine, Ser: 0.96 mg/dL (ref 0.44–1.00)
GFR calc Af Amer: >60 mL/min (ref >60)
GFR calc non Af Amer: 52 mL/min — ABNORMAL LOW (ref >60)
Glucose, Bld: 175 mg/dL — ABNORMAL HIGH (ref 70–99)
Potassium: 3.5 mmol/L (ref 3.5–5.1)
Sodium: 138 mmol/L (ref 135–145)

## 2019-10-25 MED ORDER — SODIUM CHLORIDE 0.9 % IV SOLN
1.0000 g | Freq: Once | INTRAVENOUS | Status: AC
  Administered 2019-10-25: 1 g via INTRAVENOUS
  Filled 2019-10-25: qty 10

## 2019-10-25 MED ORDER — CEPHALEXIN 500 MG PO CAPS: 500.0000 mg | ORAL_CAPSULE | Freq: Two times a day (BID) | ORAL | 0 refills | Status: DC

## 2019-10-25 MED ORDER — ONDANSETRON 8 MG PO TBDP: 8.0000 mg | ORAL_TABLET | Freq: Three times a day (TID) | ORAL | 0 refills | Status: DC | PRN

## 2019-10-25 MED ORDER — SODIUM CHLORIDE 0.9 % IV BOLUS
500.0000 mL | Freq: Once | INTRAVENOUS | Status: AC
  Administered 2019-10-25: 500 mL via INTRAVENOUS

## 2019-10-25 NOTE — ED Provider Notes (Signed)
[facility] DEPT Provider Note   CSN: [id number] Arrival date & time: 10/25/19  1018     History Chief Complaint  Patient presents with  . Weakness    Kimberly Ramirez is a 86 y.o. female.  HPI    86 year old female comes in a chief complaint of weakness.  She has history of Alzheimer's disease, TIA and hypertension.  I called [facility] and was informed that patient started having weakness, diarrhea and was sent to the ER for further evaluation.  I spoke with patient's family and they informed me that patient had UTI couple of months back where she was given Bactrim.  Immediately after she received Bactrim she had similar symptoms of nausea, vomiting, diarrhea and weakness.  Her symptoms improved after Bactrim was discontinued.  They informed me that patient was started on Bactrim empirically last night and has received 2 doses of it.  Patient was doing fine before the antibiotics.   [name] denies any pain in her abdomen.  She is currently not nauseated or having abd pain.  I spoke with patient's son who is a Psychologist, sport and exercise on the phone as well to get further history.  Past Medical History:  Diagnosis Date  . Alzheimer disease ([place])   . Closed right hip fracture ([place]) 05/2017  . Hypertension   . TIA (transient ischemic attack) 2019    Patient Active Problem List   Diagnosis Date Noted  . Closed fracture of right proximal humerus 07/20/2019  . Closed right hip fracture, initial encounter ([place]) 05/30/2017  . Leukocytosis 05/30/2017  . Hip fracture ([place]) 05/30/2017  . Dysuria 05/30/2017  . TIA (transient ischemic attack) 05/16/2016    Past Surgical History:  Procedure Laterality Date  . ABDOMINAL HYSTERECTOMY    . FEMUR IM NAIL Right 05/31/2017  . FRACTURE SURGERY     left leg  . INTRAMEDULLARY (IM) NAIL INTERTROCHANTERIC Right 05/31/2017   Procedure: INTRAMEDULLARY (IM) NAIL INTERTROCHANTRIC;  Surgeon: Hiram Gash, MD;  Location:  [facility];  Service: Orthopedics;  Laterality: Right;  . TOTAL SHOULDER ARTHROPLASTY Right 07/20/2019   Procedure: TOTAL SHOULDER ARTHROPLASTY;  Surgeon: Hiram Gash, MD;  Location: [facility];  Service: Orthopedics;  Laterality: Right;     OB History   No obstetric history on file.     Family History  Problem Relation Age of Onset  . Parkinson's disease Mother   . Heart Problems Father     Social History   Tobacco Use  . Smoking status: Never Smoker  . Smokeless tobacco: Never Used  Vaping Use  . Vaping Use: Never used  Substance Use Topics  . Alcohol use: No  . Drug use: No    Home Medications Prior to Admission medications   Medication Sig Start Date End Date Taking? Authorizing Provider  acetaminophen (TYLENOL) 500 MG tablet Take 500-1,000 mg by mouth 3 (three) times daily as needed (1 tablet for mild/moderate pain, and 2 tablets for severe pain).    Yes [provider]  Emollient Jilda Panda) LOTN Apply 1 application topically 3 (three) times daily as needed (dry skin or itching).   Yes [provider]  guaifenesin (ROBITUSSIN) 100 MG/5ML syrup Take 200 mg by mouth every 4 (four) hours as needed for cough.   Yes [provider]  polyethylene glycol (MIRALAX / GLYCOLAX) 17 g packet Take 17 g by mouth daily.   Yes [provider]  Polyvinyl Alcohol-Povidone (CLEAR EYES NATURAL TEARS) 5-6 MG/ML SOLN Place  1 drop into both eyes at bedtime.   Yes [provider]  Zinc Oxide (DESITIN RAPID RELIEF) 13 % CREA Apply 1 application topically See admin instructions. To buttocks three times daily with shift change, AND as needed for rash or redness   Yes [provider]  cephALEXin (KEFLEX) 500 MG capsule Take 1 capsule (500 mg total) by mouth 2 (two) times daily. 10/25/19   Varney Biles, MD  ondansetron (ZOFRAN ODT) 8 MG disintegrating tablet Take 1 tablet (8 mg total) by mouth every 8 (eight) hours as needed for nausea. 10/25/19    Varney Biles, MD  sulfamethoxazole-trimethoprim (BACTRIM DS) 800-160 MG tablet Take 1 tablet by mouth 2 (two) times daily. 10/24/19 10/29/19  [provider]    Allergies    Septra [sulfamethoxazole-trimethoprim]  Review of Systems   Review of Systems  Constitutional: Positive for activity change. Negative for fever.  Respiratory: Negative for shortness of breath.   Gastrointestinal: Positive for nausea and vomiting.  Skin: Negative for rash.  Neurological: Positive for weakness.    Physical Exam Updated Vital Signs BP (!) 134/56   Pulse 93   Temp 98.2 F (36.8 C) (Oral)   Resp 18   SpO2 100%   Physical Exam Vitals and nursing note reviewed.  Constitutional:      Appearance: She is well-developed.  HENT:     Head: Normocephalic and atraumatic.     Mouth/Throat:     Mouth: Mucous membranes are moist.     Comments: Oral exam did not reveal any tongue swelling Cardiovascular:     Rate and Rhythm: Normal rate.  Pulmonary:     Effort: Pulmonary effort is normal.     Breath sounds: No stridor. No wheezing.  Abdominal:     General: Bowel sounds are normal.     Tenderness: There is no abdominal tenderness.  Musculoskeletal:     Cervical back: Normal range of motion and neck supple.  Skin:    General: Skin is warm and dry.  Neurological:     Mental Status: She is alert and oriented to person, place, and time.     ED Results / Procedures / Treatments   Labs (all labs ordered are listed, but only abnormal results are displayed) Labs Reviewed  BASIC METABOLIC PANEL - Abnormal; Notable for the following components:      Result Value   Glucose, Bld 175 (*)    GFR calc non Af Amer 52 (*)    All other components within normal limits  CBC - Abnormal; Notable for the following components:   WBC 15.0 (*)    All other components within normal limits  URINALYSIS, ROUTINE W REFLEX MICROSCOPIC - Abnormal; Notable for the following components:   Leukocytes,Ua TRACE  (*)    Bacteria, UA RARE (*)    All other components within normal limits  URINE CULTURE    EKG None  Radiology DG Chest Port 1 View  Result Date: 10/25/2019 CLINICAL DATA:  Pneumonia. EXAM: PORTABLE CHEST 1 VIEW COMPARISON:  July 05, 2019. FINDINGS: The heart size and mediastinal contours are within normal limits. Both lungs are clear. The visualized skeletal structures are unremarkable. IMPRESSION: No active disease. Electronically Signed   By: Marijo Conception M.D.   On: 10/25/2019 12:59    Procedures Procedures (including critical care time)  Medications Ordered in ED Medications  sodium chloride 0.9 % bolus 500 mL (0 mLs Intravenous Stopped 10/25/19 1417)  cefTRIAXone (ROCEPHIN) 1 g in sodium chloride  0.9 % 100 mL IVPB (0 g Intravenous Stopped 10/25/19 1308)    ED Course  I have reviewed the triage vital signs and the nursing notes.  Pertinent labs & imaging results that were available during my care of the patient were reviewed by me and considered in my medical decision making (see chart for details).    MDM Rules/Calculators/A&P                          Ms. Mccuen comes into the ER with chief complaint of weakness, nausea, vomiting and diarrhea.  She was started on Bactrim yesterday night and the symptoms started thereafter.  She has known history of similar effects after taking Bactrim earlier.  I do not see an encounter for Bactrim side effect in our system.  Habits would was not very helpful, however fortunately I was able to speak with her son and daughter who confirmed that patient had very similar symptoms with Bactrim utilization few months back.  It is possible that she is is having side effects and not true allergic reaction.  She is not having any allergic reaction to her skin, lungs.  We will start oral challenge.  Give IV antibiotics.  Give her IV ceftriaxone and discharge her with Keflex.  We have advised SNF to discontinue Bactrim.  Final Clinical  Impression(s) / ED Diagnoses Final diagnoses:  Medication side effect, initial encounter  Cystitis  Dehydration    Rx / DC Orders ED Discharge Orders         Ordered    cephALEXin (KEFLEX) 500 MG capsule  2 times daily     Discontinue  Reprint     10/25/19 1414    ondansetron (ZOFRAN ODT) 8 MG disintegrating tablet  Every 8 hours PRN     Discontinue  Reprint     10/25/19 1414           Varney Biles, MD 10/25/19 1429

## 2019-10-25 NOTE — Discharge Instructions (Addendum)
Ms. Kimberly Ramirez was seen in the ER for her symptoms and she was found to have mild dehydration.  It appears that her symptoms are because of medication side effects.  Please ensure she does not get Bactrim anymore.    Sometimes even allergic reaction can cause nausea, diarrhea -so if she starts developing new rash, swelling, shortness of breath, wheezing, send her to the ER.  We are also switching her antibiotics to Keflex.  Please return to the ER if your symptoms worsen; you have increased pain, fevers, chills, inability to keep any medications down, confusion. Otherwise see the outpatient doctor as requested.

## 2019-10-25 NOTE — ED Notes (Signed)
Patient has drank 8oz of ice water without difficulty or emesis

## 2019-10-25 NOTE — ED Triage Notes (Addendum)
Per EMS pt diagnosed with UTI yesterday on Ceptra with no improvement; n/v and weakness this AM. Per family pt had a reaction to [place] last time she was on it and physician was never to prescribe it to her again and did.   EMS gave 4 mg zofran IV and 250 NS bolus IV.

## 2019-10-25 NOTE — ED Notes (Signed)
Family at bedside verbalizes pt "just getting over a cold and she was doing much better. She even went to the dining room to eat last night."

## 2019-10-27 DIAGNOSIS — M25511 Pain in right shoulder: Secondary | ICD-10-CM | POA: Diagnosis not present

## 2019-10-27 DIAGNOSIS — M6281 Muscle weakness (generalized): Secondary | ICD-10-CM | POA: Diagnosis not present

## 2019-10-27 DIAGNOSIS — R262 Difficulty in walking, not elsewhere classified: Secondary | ICD-10-CM | POA: Diagnosis not present

## 2019-10-27 DIAGNOSIS — Z20828 Contact with and (suspected) exposure to other viral communicable diseases: Secondary | ICD-10-CM | POA: Diagnosis not present

## 2019-10-27 DIAGNOSIS — R2681 Unsteadiness on feet: Secondary | ICD-10-CM | POA: Diagnosis not present

## 2019-10-27 DIAGNOSIS — Z1159 Encounter for screening for other viral diseases: Secondary | ICD-10-CM | POA: Diagnosis not present

## 2019-10-27 DIAGNOSIS — R278 Other lack of coordination: Secondary | ICD-10-CM | POA: Diagnosis not present

## 2019-10-27 LAB — URINE CULTURE

## 2019-10-28 DIAGNOSIS — R262 Difficulty in walking, not elsewhere classified: Secondary | ICD-10-CM | POA: Diagnosis not present

## 2019-10-28 DIAGNOSIS — R278 Other lack of coordination: Secondary | ICD-10-CM | POA: Diagnosis not present

## 2019-10-28 DIAGNOSIS — M6281 Muscle weakness (generalized): Secondary | ICD-10-CM | POA: Diagnosis not present

## 2019-10-28 DIAGNOSIS — R2681 Unsteadiness on feet: Secondary | ICD-10-CM | POA: Diagnosis not present

## 2019-10-28 DIAGNOSIS — M25511 Pain in right shoulder: Secondary | ICD-10-CM | POA: Diagnosis not present

## 2019-10-31 DIAGNOSIS — Z1159 Encounter for screening for other viral diseases: Secondary | ICD-10-CM | POA: Diagnosis not present

## 2019-10-31 DIAGNOSIS — M6281 Muscle weakness (generalized): Secondary | ICD-10-CM | POA: Diagnosis not present

## 2019-10-31 DIAGNOSIS — M25511 Pain in right shoulder: Secondary | ICD-10-CM | POA: Diagnosis not present

## 2019-10-31 DIAGNOSIS — Z20828 Contact with and (suspected) exposure to other viral communicable diseases: Secondary | ICD-10-CM | POA: Diagnosis not present

## 2019-10-31 DIAGNOSIS — R278 Other lack of coordination: Secondary | ICD-10-CM | POA: Diagnosis not present

## 2019-10-31 DIAGNOSIS — R262 Difficulty in walking, not elsewhere classified: Secondary | ICD-10-CM | POA: Diagnosis not present

## 2019-10-31 DIAGNOSIS — R2681 Unsteadiness on feet: Secondary | ICD-10-CM | POA: Diagnosis not present

## 2019-11-02 DIAGNOSIS — M6281 Muscle weakness (generalized): Secondary | ICD-10-CM | POA: Diagnosis not present

## 2019-11-02 DIAGNOSIS — R278 Other lack of coordination: Secondary | ICD-10-CM | POA: Diagnosis not present

## 2019-11-02 DIAGNOSIS — R262 Difficulty in walking, not elsewhere classified: Secondary | ICD-10-CM | POA: Diagnosis not present

## 2019-11-02 DIAGNOSIS — M25511 Pain in right shoulder: Secondary | ICD-10-CM | POA: Diagnosis not present

## 2019-11-02 DIAGNOSIS — R2681 Unsteadiness on feet: Secondary | ICD-10-CM | POA: Diagnosis not present

## 2019-11-03 DIAGNOSIS — Z20828 Contact with and (suspected) exposure to other viral communicable diseases: Secondary | ICD-10-CM | POA: Diagnosis not present

## 2019-11-03 DIAGNOSIS — Z1159 Encounter for screening for other viral diseases: Secondary | ICD-10-CM | POA: Diagnosis not present

## 2019-11-04 DIAGNOSIS — M6281 Muscle weakness (generalized): Secondary | ICD-10-CM | POA: Diagnosis not present

## 2019-11-04 DIAGNOSIS — R2681 Unsteadiness on feet: Secondary | ICD-10-CM | POA: Diagnosis not present

## 2019-11-04 DIAGNOSIS — M25511 Pain in right shoulder: Secondary | ICD-10-CM | POA: Diagnosis not present

## 2019-11-04 DIAGNOSIS — R278 Other lack of coordination: Secondary | ICD-10-CM | POA: Diagnosis not present

## 2019-11-04 DIAGNOSIS — R262 Difficulty in walking, not elsewhere classified: Secondary | ICD-10-CM | POA: Diagnosis not present

## 2019-11-07 DIAGNOSIS — M25511 Pain in right shoulder: Secondary | ICD-10-CM | POA: Diagnosis not present

## 2019-11-07 DIAGNOSIS — Z20828 Contact with and (suspected) exposure to other viral communicable diseases: Secondary | ICD-10-CM | POA: Diagnosis not present

## 2019-11-07 DIAGNOSIS — R262 Difficulty in walking, not elsewhere classified: Secondary | ICD-10-CM | POA: Diagnosis not present

## 2019-11-07 DIAGNOSIS — M6281 Muscle weakness (generalized): Secondary | ICD-10-CM | POA: Diagnosis not present

## 2019-11-07 DIAGNOSIS — R278 Other lack of coordination: Secondary | ICD-10-CM | POA: Diagnosis not present

## 2019-11-07 DIAGNOSIS — Z1159 Encounter for screening for other viral diseases: Secondary | ICD-10-CM | POA: Diagnosis not present

## 2019-11-07 DIAGNOSIS — R2681 Unsteadiness on feet: Secondary | ICD-10-CM | POA: Diagnosis not present

## 2019-11-08 DIAGNOSIS — M6281 Muscle weakness (generalized): Secondary | ICD-10-CM | POA: Diagnosis not present

## 2019-11-08 DIAGNOSIS — M25511 Pain in right shoulder: Secondary | ICD-10-CM | POA: Diagnosis not present

## 2019-11-08 DIAGNOSIS — R262 Difficulty in walking, not elsewhere classified: Secondary | ICD-10-CM | POA: Diagnosis not present

## 2019-11-08 DIAGNOSIS — R278 Other lack of coordination: Secondary | ICD-10-CM | POA: Diagnosis not present

## 2019-11-08 DIAGNOSIS — R2681 Unsteadiness on feet: Secondary | ICD-10-CM | POA: Diagnosis not present

## 2019-11-09 DIAGNOSIS — R2681 Unsteadiness on feet: Secondary | ICD-10-CM | POA: Diagnosis not present

## 2019-11-09 DIAGNOSIS — M6281 Muscle weakness (generalized): Secondary | ICD-10-CM | POA: Diagnosis not present

## 2019-11-09 DIAGNOSIS — M25511 Pain in right shoulder: Secondary | ICD-10-CM | POA: Diagnosis not present

## 2019-11-09 DIAGNOSIS — R262 Difficulty in walking, not elsewhere classified: Secondary | ICD-10-CM | POA: Diagnosis not present

## 2019-11-09 DIAGNOSIS — R278 Other lack of coordination: Secondary | ICD-10-CM | POA: Diagnosis not present

## 2019-11-10 DIAGNOSIS — R278 Other lack of coordination: Secondary | ICD-10-CM | POA: Diagnosis not present

## 2019-11-10 DIAGNOSIS — R262 Difficulty in walking, not elsewhere classified: Secondary | ICD-10-CM | POA: Diagnosis not present

## 2019-11-10 DIAGNOSIS — M25511 Pain in right shoulder: Secondary | ICD-10-CM | POA: Diagnosis not present

## 2019-11-10 DIAGNOSIS — R2681 Unsteadiness on feet: Secondary | ICD-10-CM | POA: Diagnosis not present

## 2019-11-10 DIAGNOSIS — M6281 Muscle weakness (generalized): Secondary | ICD-10-CM | POA: Diagnosis not present

## 2019-11-14 DIAGNOSIS — M6281 Muscle weakness (generalized): Secondary | ICD-10-CM | POA: Diagnosis not present

## 2019-11-14 DIAGNOSIS — M25511 Pain in right shoulder: Secondary | ICD-10-CM | POA: Diagnosis not present

## 2019-11-14 DIAGNOSIS — Z1159 Encounter for screening for other viral diseases: Secondary | ICD-10-CM | POA: Diagnosis not present

## 2019-11-14 DIAGNOSIS — R2681 Unsteadiness on feet: Secondary | ICD-10-CM | POA: Diagnosis not present

## 2019-11-14 DIAGNOSIS — Z20828 Contact with and (suspected) exposure to other viral communicable diseases: Secondary | ICD-10-CM | POA: Diagnosis not present

## 2019-11-14 DIAGNOSIS — R262 Difficulty in walking, not elsewhere classified: Secondary | ICD-10-CM | POA: Diagnosis not present

## 2019-11-14 DIAGNOSIS — R278 Other lack of coordination: Secondary | ICD-10-CM | POA: Diagnosis not present

## 2019-11-15 DIAGNOSIS — M6281 Muscle weakness (generalized): Secondary | ICD-10-CM | POA: Diagnosis not present

## 2019-11-15 DIAGNOSIS — R2681 Unsteadiness on feet: Secondary | ICD-10-CM | POA: Diagnosis not present

## 2019-11-15 DIAGNOSIS — M25511 Pain in right shoulder: Secondary | ICD-10-CM | POA: Diagnosis not present

## 2019-11-15 DIAGNOSIS — R278 Other lack of coordination: Secondary | ICD-10-CM | POA: Diagnosis not present

## 2019-11-15 DIAGNOSIS — R262 Difficulty in walking, not elsewhere classified: Secondary | ICD-10-CM | POA: Diagnosis not present

## 2019-11-16 DIAGNOSIS — R262 Difficulty in walking, not elsewhere classified: Secondary | ICD-10-CM | POA: Diagnosis not present

## 2019-11-16 DIAGNOSIS — R2681 Unsteadiness on feet: Secondary | ICD-10-CM | POA: Diagnosis not present

## 2019-11-16 DIAGNOSIS — M25511 Pain in right shoulder: Secondary | ICD-10-CM | POA: Diagnosis not present

## 2019-11-16 DIAGNOSIS — M6281 Muscle weakness (generalized): Secondary | ICD-10-CM | POA: Diagnosis not present

## 2019-11-16 DIAGNOSIS — R278 Other lack of coordination: Secondary | ICD-10-CM | POA: Diagnosis not present

## 2019-11-17 DIAGNOSIS — Z1159 Encounter for screening for other viral diseases: Secondary | ICD-10-CM | POA: Diagnosis not present

## 2019-11-17 DIAGNOSIS — Z20828 Contact with and (suspected) exposure to other viral communicable diseases: Secondary | ICD-10-CM | POA: Diagnosis not present

## 2019-11-21 DIAGNOSIS — R2681 Unsteadiness on feet: Secondary | ICD-10-CM | POA: Diagnosis not present

## 2019-11-21 DIAGNOSIS — R262 Difficulty in walking, not elsewhere classified: Secondary | ICD-10-CM | POA: Diagnosis not present

## 2019-11-21 DIAGNOSIS — Z20828 Contact with and (suspected) exposure to other viral communicable diseases: Secondary | ICD-10-CM | POA: Diagnosis not present

## 2019-11-21 DIAGNOSIS — M6281 Muscle weakness (generalized): Secondary | ICD-10-CM | POA: Diagnosis not present

## 2019-11-21 DIAGNOSIS — M25511 Pain in right shoulder: Secondary | ICD-10-CM | POA: Diagnosis not present

## 2019-11-21 DIAGNOSIS — R278 Other lack of coordination: Secondary | ICD-10-CM | POA: Diagnosis not present

## 2019-11-21 DIAGNOSIS — Z1159 Encounter for screening for other viral diseases: Secondary | ICD-10-CM | POA: Diagnosis not present

## 2019-11-23 DIAGNOSIS — R262 Difficulty in walking, not elsewhere classified: Secondary | ICD-10-CM | POA: Diagnosis not present

## 2019-11-23 DIAGNOSIS — R278 Other lack of coordination: Secondary | ICD-10-CM | POA: Diagnosis not present

## 2019-11-23 DIAGNOSIS — R2681 Unsteadiness on feet: Secondary | ICD-10-CM | POA: Diagnosis not present

## 2019-11-23 DIAGNOSIS — M25511 Pain in right shoulder: Secondary | ICD-10-CM | POA: Diagnosis not present

## 2019-11-23 DIAGNOSIS — M6281 Muscle weakness (generalized): Secondary | ICD-10-CM | POA: Diagnosis not present

## 2019-11-24 DIAGNOSIS — Z20828 Contact with and (suspected) exposure to other viral communicable diseases: Secondary | ICD-10-CM | POA: Diagnosis not present

## 2019-11-24 DIAGNOSIS — Z1159 Encounter for screening for other viral diseases: Secondary | ICD-10-CM | POA: Diagnosis not present

## 2019-11-25 DIAGNOSIS — R2681 Unsteadiness on feet: Secondary | ICD-10-CM | POA: Diagnosis not present

## 2019-11-25 DIAGNOSIS — R278 Other lack of coordination: Secondary | ICD-10-CM | POA: Diagnosis not present

## 2019-11-25 DIAGNOSIS — R262 Difficulty in walking, not elsewhere classified: Secondary | ICD-10-CM | POA: Diagnosis not present

## 2019-11-25 DIAGNOSIS — M25511 Pain in right shoulder: Secondary | ICD-10-CM | POA: Diagnosis not present

## 2019-11-25 DIAGNOSIS — M6281 Muscle weakness (generalized): Secondary | ICD-10-CM | POA: Diagnosis not present

## 2019-11-28 DIAGNOSIS — Z1159 Encounter for screening for other viral diseases: Secondary | ICD-10-CM | POA: Diagnosis not present

## 2019-11-28 DIAGNOSIS — Z20828 Contact with and (suspected) exposure to other viral communicable diseases: Secondary | ICD-10-CM | POA: Diagnosis not present

## 2019-11-29 DIAGNOSIS — R300 Dysuria: Secondary | ICD-10-CM | POA: Diagnosis not present

## 2019-11-29 DIAGNOSIS — E441 Mild protein-calorie malnutrition: Secondary | ICD-10-CM | POA: Diagnosis not present

## 2019-11-29 DIAGNOSIS — G301 Alzheimer's disease with late onset: Secondary | ICD-10-CM | POA: Diagnosis not present

## 2019-11-29 DIAGNOSIS — F0280 Dementia in other diseases classified elsewhere without behavioral disturbance: Secondary | ICD-10-CM | POA: Diagnosis not present

## 2019-11-30 ENCOUNTER — Ambulatory Visit: Payer: Medicare Other | Admitting: Podiatry

## 2019-12-01 DIAGNOSIS — Z1159 Encounter for screening for other viral diseases: Secondary | ICD-10-CM | POA: Diagnosis not present

## 2019-12-01 DIAGNOSIS — Z20828 Contact with and (suspected) exposure to other viral communicable diseases: Secondary | ICD-10-CM | POA: Diagnosis not present

## 2019-12-02 DIAGNOSIS — R2681 Unsteadiness on feet: Secondary | ICD-10-CM | POA: Diagnosis not present

## 2019-12-02 DIAGNOSIS — M25511 Pain in right shoulder: Secondary | ICD-10-CM | POA: Diagnosis not present

## 2019-12-02 DIAGNOSIS — R278 Other lack of coordination: Secondary | ICD-10-CM | POA: Diagnosis not present

## 2019-12-02 DIAGNOSIS — R262 Difficulty in walking, not elsewhere classified: Secondary | ICD-10-CM | POA: Diagnosis not present

## 2019-12-02 DIAGNOSIS — M6281 Muscle weakness (generalized): Secondary | ICD-10-CM | POA: Diagnosis not present

## 2019-12-05 DIAGNOSIS — R262 Difficulty in walking, not elsewhere classified: Secondary | ICD-10-CM | POA: Diagnosis not present

## 2019-12-05 DIAGNOSIS — R278 Other lack of coordination: Secondary | ICD-10-CM | POA: Diagnosis not present

## 2019-12-05 DIAGNOSIS — R2681 Unsteadiness on feet: Secondary | ICD-10-CM | POA: Diagnosis not present

## 2019-12-05 DIAGNOSIS — M6281 Muscle weakness (generalized): Secondary | ICD-10-CM | POA: Diagnosis not present

## 2019-12-05 DIAGNOSIS — Z20828 Contact with and (suspected) exposure to other viral communicable diseases: Secondary | ICD-10-CM | POA: Diagnosis not present

## 2019-12-05 DIAGNOSIS — M25511 Pain in right shoulder: Secondary | ICD-10-CM | POA: Diagnosis not present

## 2019-12-05 DIAGNOSIS — Z1159 Encounter for screening for other viral diseases: Secondary | ICD-10-CM | POA: Diagnosis not present

## 2019-12-06 DIAGNOSIS — R2681 Unsteadiness on feet: Secondary | ICD-10-CM | POA: Diagnosis not present

## 2019-12-06 DIAGNOSIS — R278 Other lack of coordination: Secondary | ICD-10-CM | POA: Diagnosis not present

## 2019-12-06 DIAGNOSIS — M25511 Pain in right shoulder: Secondary | ICD-10-CM | POA: Diagnosis not present

## 2019-12-06 DIAGNOSIS — M6281 Muscle weakness (generalized): Secondary | ICD-10-CM | POA: Diagnosis not present

## 2019-12-06 DIAGNOSIS — R262 Difficulty in walking, not elsewhere classified: Secondary | ICD-10-CM | POA: Diagnosis not present

## 2019-12-07 DIAGNOSIS — R2681 Unsteadiness on feet: Secondary | ICD-10-CM | POA: Diagnosis not present

## 2019-12-07 DIAGNOSIS — M6281 Muscle weakness (generalized): Secondary | ICD-10-CM | POA: Diagnosis not present

## 2019-12-07 DIAGNOSIS — R278 Other lack of coordination: Secondary | ICD-10-CM | POA: Diagnosis not present

## 2019-12-07 DIAGNOSIS — M25511 Pain in right shoulder: Secondary | ICD-10-CM | POA: Diagnosis not present

## 2019-12-07 DIAGNOSIS — R262 Difficulty in walking, not elsewhere classified: Secondary | ICD-10-CM | POA: Diagnosis not present

## 2019-12-08 DIAGNOSIS — R278 Other lack of coordination: Secondary | ICD-10-CM | POA: Diagnosis not present

## 2019-12-08 DIAGNOSIS — M6281 Muscle weakness (generalized): Secondary | ICD-10-CM | POA: Diagnosis not present

## 2019-12-08 DIAGNOSIS — R2681 Unsteadiness on feet: Secondary | ICD-10-CM | POA: Diagnosis not present

## 2019-12-08 DIAGNOSIS — Z1159 Encounter for screening for other viral diseases: Secondary | ICD-10-CM | POA: Diagnosis not present

## 2019-12-08 DIAGNOSIS — Z20828 Contact with and (suspected) exposure to other viral communicable diseases: Secondary | ICD-10-CM | POA: Diagnosis not present

## 2019-12-08 DIAGNOSIS — R262 Difficulty in walking, not elsewhere classified: Secondary | ICD-10-CM | POA: Diagnosis not present

## 2019-12-08 DIAGNOSIS — M25511 Pain in right shoulder: Secondary | ICD-10-CM | POA: Diagnosis not present

## 2019-12-09 DIAGNOSIS — M6281 Muscle weakness (generalized): Secondary | ICD-10-CM | POA: Diagnosis not present

## 2019-12-09 DIAGNOSIS — R278 Other lack of coordination: Secondary | ICD-10-CM | POA: Diagnosis not present

## 2019-12-09 DIAGNOSIS — M25511 Pain in right shoulder: Secondary | ICD-10-CM | POA: Diagnosis not present

## 2019-12-09 DIAGNOSIS — R2681 Unsteadiness on feet: Secondary | ICD-10-CM | POA: Diagnosis not present

## 2019-12-09 DIAGNOSIS — R262 Difficulty in walking, not elsewhere classified: Secondary | ICD-10-CM | POA: Diagnosis not present

## 2019-12-12 DIAGNOSIS — Z1159 Encounter for screening for other viral diseases: Secondary | ICD-10-CM | POA: Diagnosis not present

## 2019-12-12 DIAGNOSIS — Z20828 Contact with and (suspected) exposure to other viral communicable diseases: Secondary | ICD-10-CM | POA: Diagnosis not present

## 2019-12-15 DIAGNOSIS — Z20828 Contact with and (suspected) exposure to other viral communicable diseases: Secondary | ICD-10-CM | POA: Diagnosis not present

## 2019-12-15 DIAGNOSIS — Z1159 Encounter for screening for other viral diseases: Secondary | ICD-10-CM | POA: Diagnosis not present

## 2019-12-19 DIAGNOSIS — Z20828 Contact with and (suspected) exposure to other viral communicable diseases: Secondary | ICD-10-CM | POA: Diagnosis not present

## 2019-12-19 DIAGNOSIS — Z1159 Encounter for screening for other viral diseases: Secondary | ICD-10-CM | POA: Diagnosis not present

## 2019-12-22 DIAGNOSIS — Z1159 Encounter for screening for other viral diseases: Secondary | ICD-10-CM | POA: Diagnosis not present

## 2019-12-22 DIAGNOSIS — Z20828 Contact with and (suspected) exposure to other viral communicable diseases: Secondary | ICD-10-CM | POA: Diagnosis not present

## 2019-12-26 DIAGNOSIS — Z1159 Encounter for screening for other viral diseases: Secondary | ICD-10-CM | POA: Diagnosis not present

## 2019-12-26 DIAGNOSIS — Z20828 Contact with and (suspected) exposure to other viral communicable diseases: Secondary | ICD-10-CM | POA: Diagnosis not present

## 2019-12-29 DIAGNOSIS — Z20828 Contact with and (suspected) exposure to other viral communicable diseases: Secondary | ICD-10-CM | POA: Diagnosis not present

## 2019-12-29 DIAGNOSIS — Z1159 Encounter for screening for other viral diseases: Secondary | ICD-10-CM | POA: Diagnosis not present

## 2020-01-02 DIAGNOSIS — Z20828 Contact with and (suspected) exposure to other viral communicable diseases: Secondary | ICD-10-CM | POA: Diagnosis not present

## 2020-01-02 DIAGNOSIS — Z1159 Encounter for screening for other viral diseases: Secondary | ICD-10-CM | POA: Diagnosis not present

## 2020-01-05 DIAGNOSIS — Z20828 Contact with and (suspected) exposure to other viral communicable diseases: Secondary | ICD-10-CM | POA: Diagnosis not present

## 2020-01-05 DIAGNOSIS — Z1159 Encounter for screening for other viral diseases: Secondary | ICD-10-CM | POA: Diagnosis not present

## 2020-01-09 DIAGNOSIS — Z1159 Encounter for screening for other viral diseases: Secondary | ICD-10-CM | POA: Diagnosis not present

## 2020-01-09 DIAGNOSIS — Z20828 Contact with and (suspected) exposure to other viral communicable diseases: Secondary | ICD-10-CM | POA: Diagnosis not present

## 2020-01-12 DIAGNOSIS — Z1159 Encounter for screening for other viral diseases: Secondary | ICD-10-CM | POA: Diagnosis not present

## 2020-01-12 DIAGNOSIS — Z20828 Contact with and (suspected) exposure to other viral communicable diseases: Secondary | ICD-10-CM | POA: Diagnosis not present

## 2020-01-16 DIAGNOSIS — Z1159 Encounter for screening for other viral diseases: Secondary | ICD-10-CM | POA: Diagnosis not present

## 2020-01-16 DIAGNOSIS — Z20828 Contact with and (suspected) exposure to other viral communicable diseases: Secondary | ICD-10-CM | POA: Diagnosis not present

## 2020-01-19 DIAGNOSIS — Z20828 Contact with and (suspected) exposure to other viral communicable diseases: Secondary | ICD-10-CM | POA: Diagnosis not present

## 2020-01-19 DIAGNOSIS — Z1159 Encounter for screening for other viral diseases: Secondary | ICD-10-CM | POA: Diagnosis not present

## 2020-01-23 DIAGNOSIS — Z20828 Contact with and (suspected) exposure to other viral communicable diseases: Secondary | ICD-10-CM | POA: Diagnosis not present

## 2020-01-23 DIAGNOSIS — Z1159 Encounter for screening for other viral diseases: Secondary | ICD-10-CM | POA: Diagnosis not present

## 2020-01-24 IMAGING — DX DG FEMUR 1V*R*
4 series · 4 of 4 positions shown · non-contrast
Comparison: None.

CLINICAL DATA: Fall, right hip pain

EXAM:
RIGHT FEMUR 1 VIEW

[femur ap (1 of 2)]
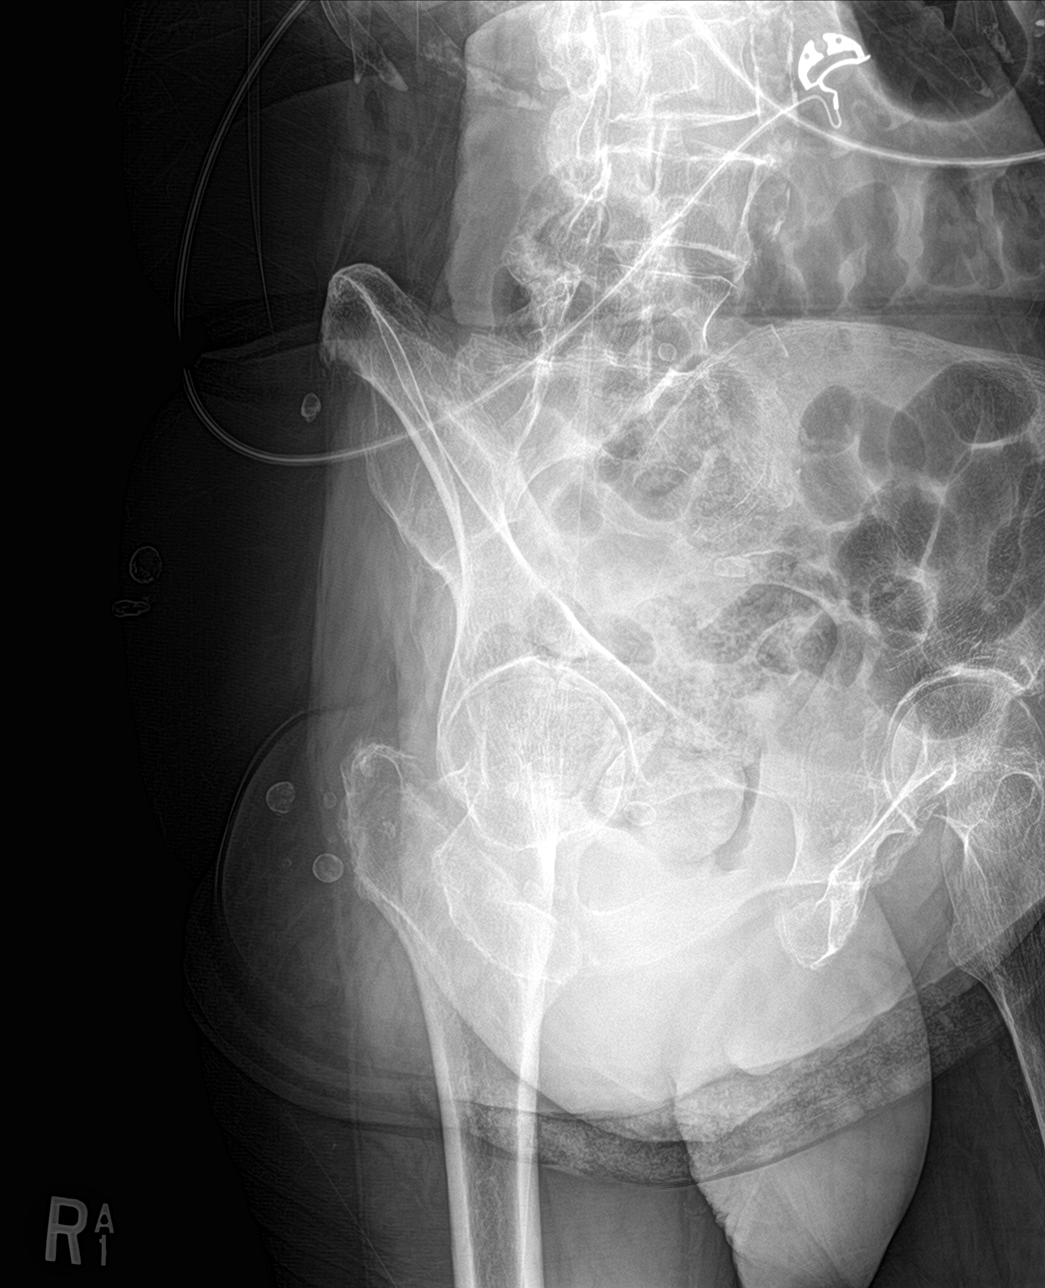

[femur ap (2 of 2)]
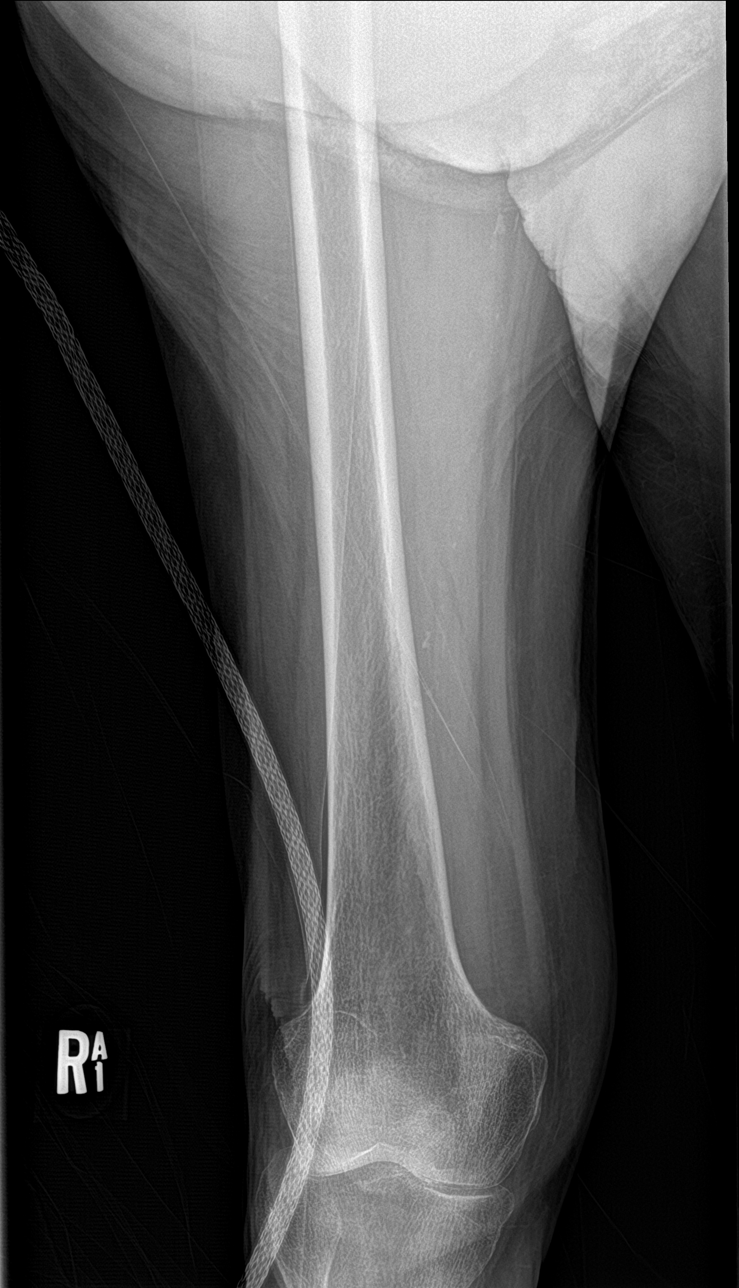

[femur lat (1 of 2)]
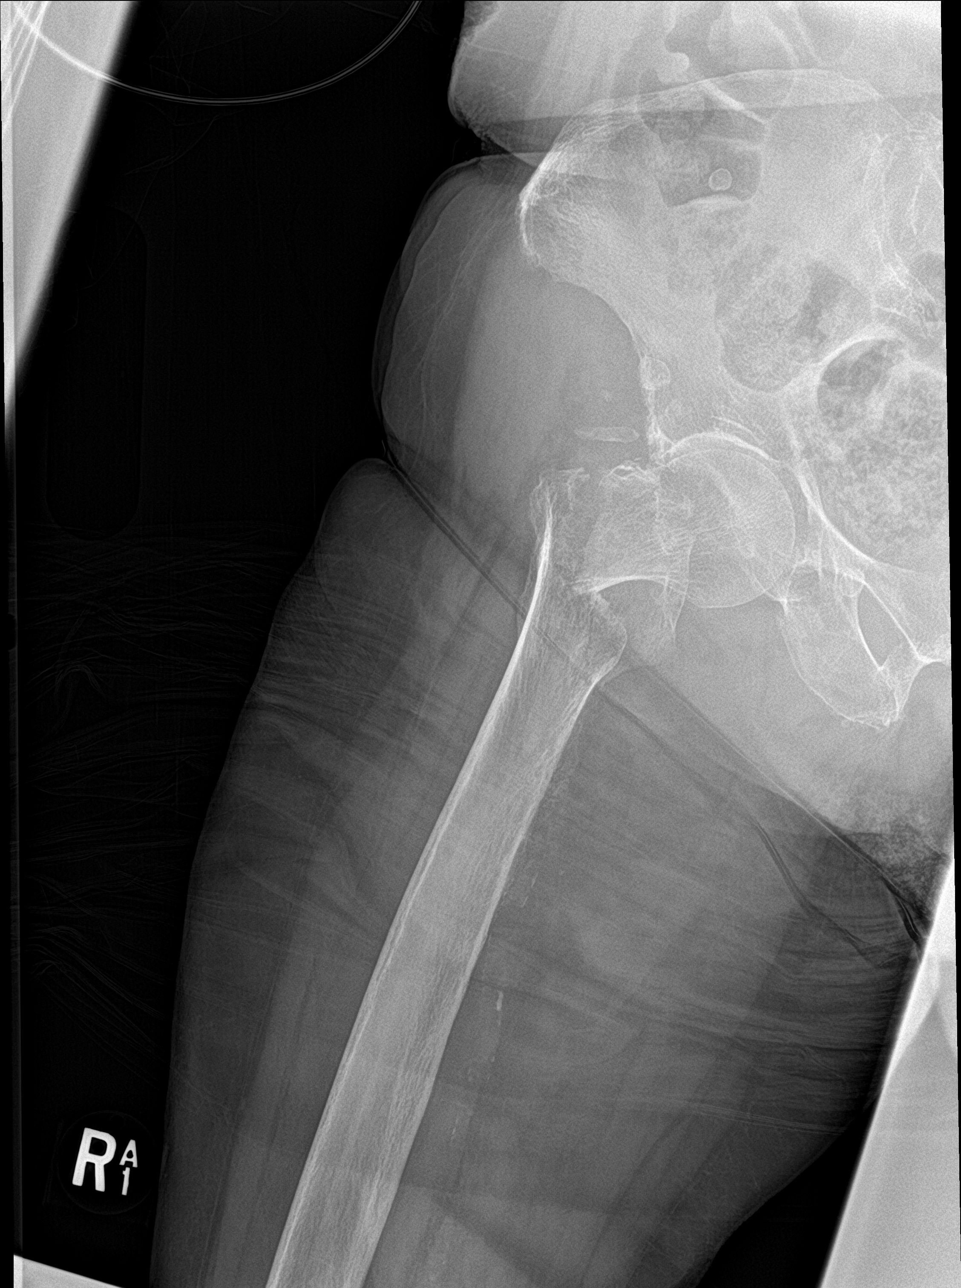

[femur lat (2 of 2)]
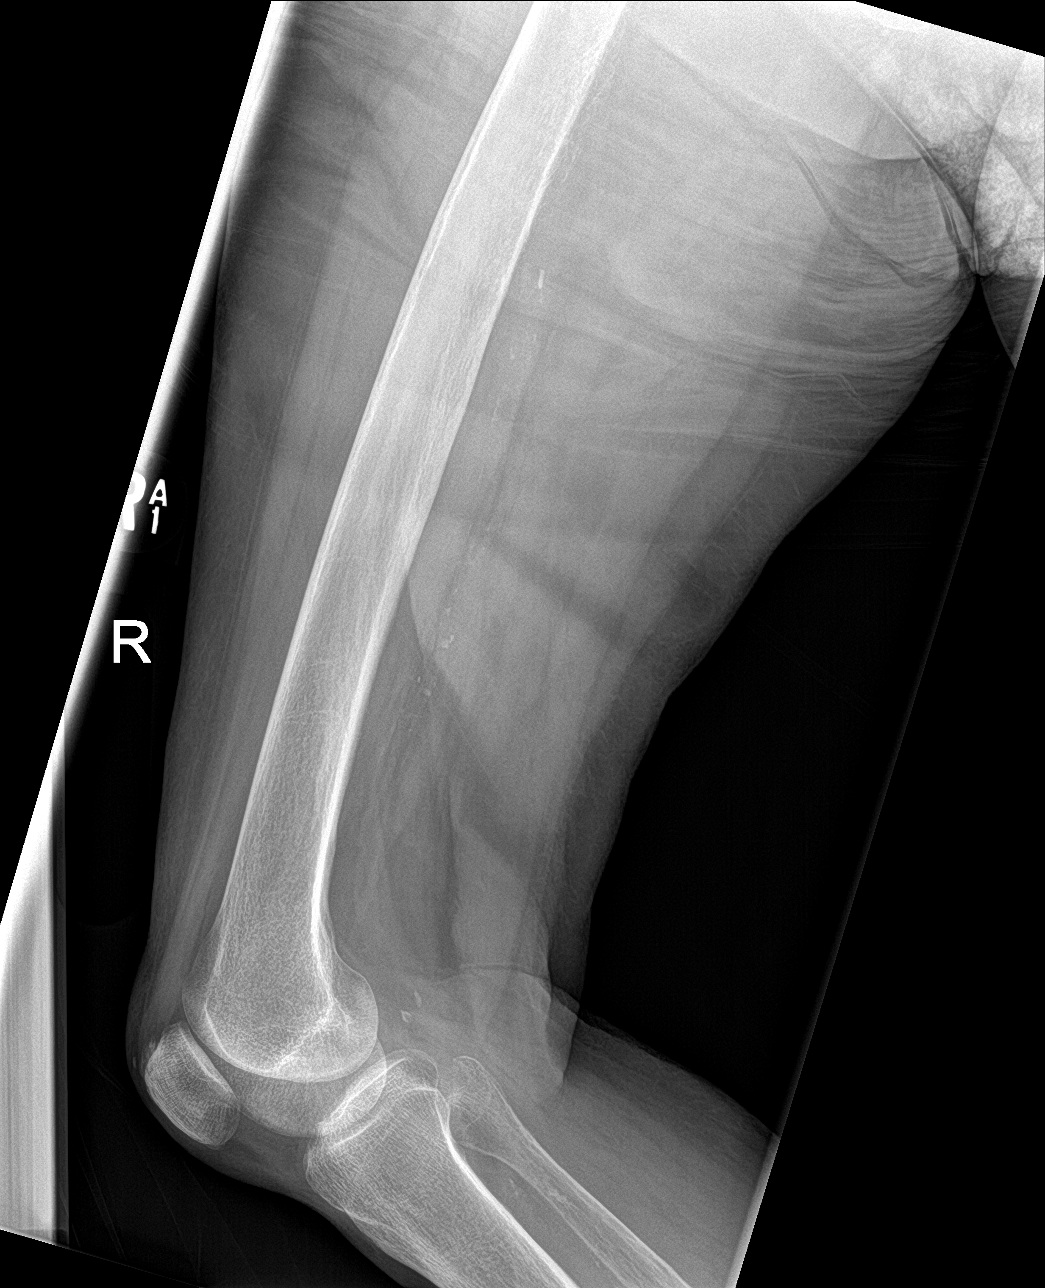

[4 of 4 positions shown; findings below may reference images not displayed]

FINDINGS: Displaced/comminuted fracture of the right femoral neck,
intertrochanteric. Right femoral head remains well positioned
relative to the acetabulum. Distal femur appears intact and normally
aligned.
IMPRESSION: Displaced/comminuted fracture of the right femoral neck,
intertrochanteric, with nearly 90 degrees angulation at the fracture
site.

## 2020-01-24 IMAGING — CT CT CERVICAL SPINE W/O CM
5 of 8 series · 11 of 33 positions shown, 12 images · non-contrast
Comparison: 05/16/2016 CT head 05/17/2016 MRI head.

CLINICAL DATA: 89 y/o  F; fall with head injury.

EXAM:
CT HEAD WITHOUT CONTRAST
CT CERVICAL SPINE WITHOUT CONTRAST
TECHNIQUE: Multidetector CT imaging of the head and cervical spine was
performed following the standard protocol without intravenous
contrast. Multiplanar CT image reconstructions of the cervical spine
were also generated.

[Series 5: head bone · axial · 0.40mm/px · z∈[-98,-44]mm · 2 of 81 slices shown]
[im 27/81  bone]
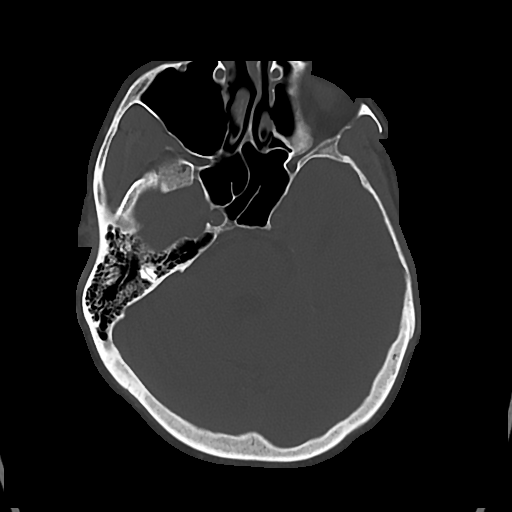
[im 54/81  bone]
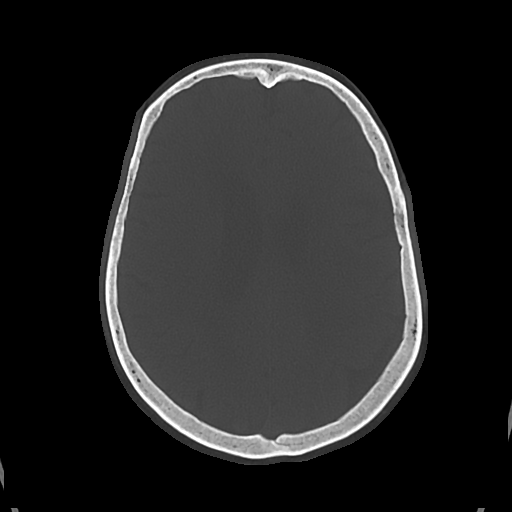

[Series 9: c spine soft · axial · 0.29mm/px · z∈[-227,-163]mm · 2 of 93 slices shown]
[im 31/93  soft-tissue]
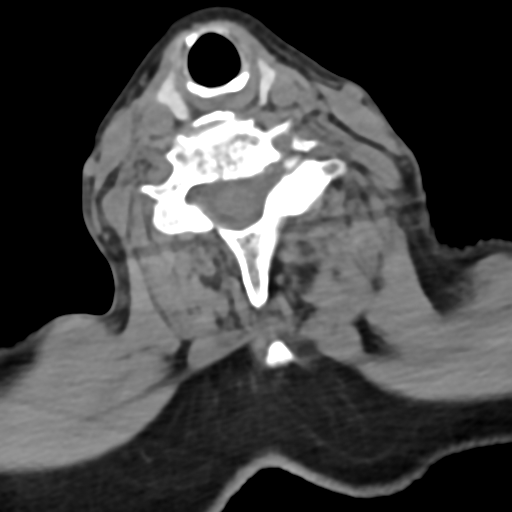
[im 62/93  soft-tissue]
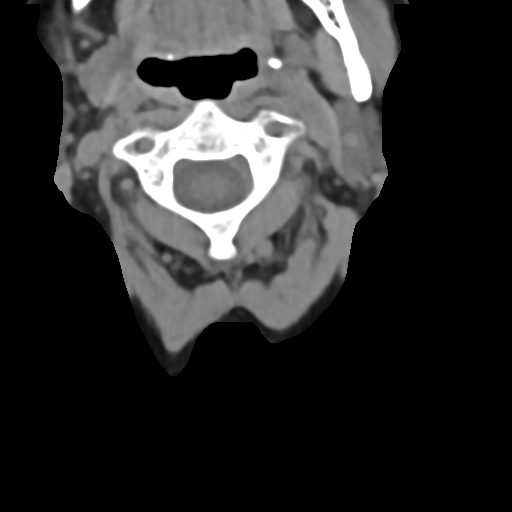

[Series 10: sag bone · sagittal · 0.32mm/px · 4 of 45 slices shown]
[im 9/45  bone]
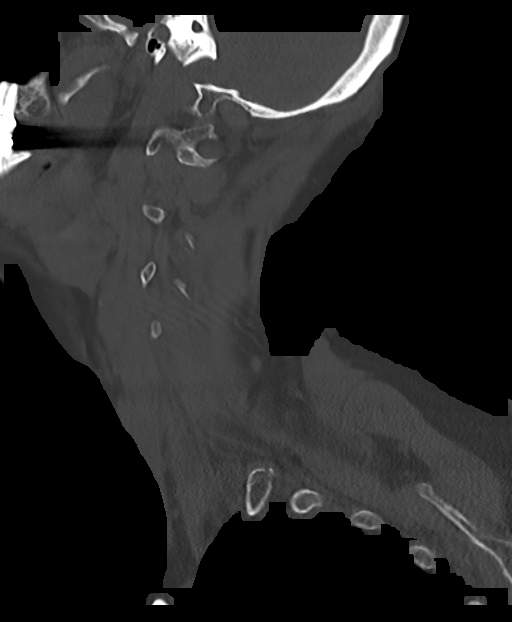
[im 18/45  bone]
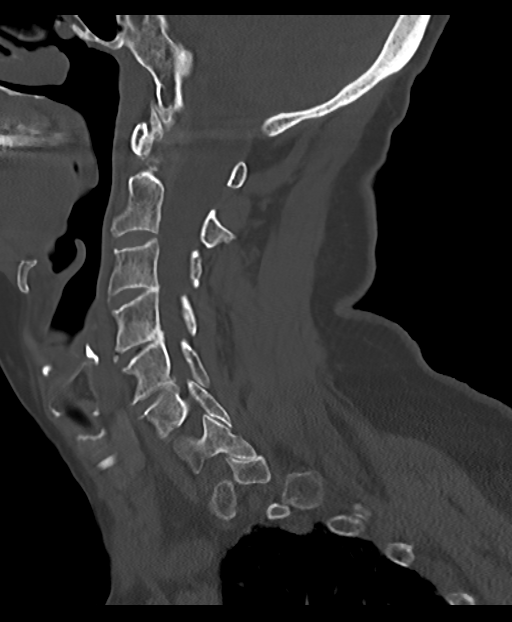
[im 27/45  bone]
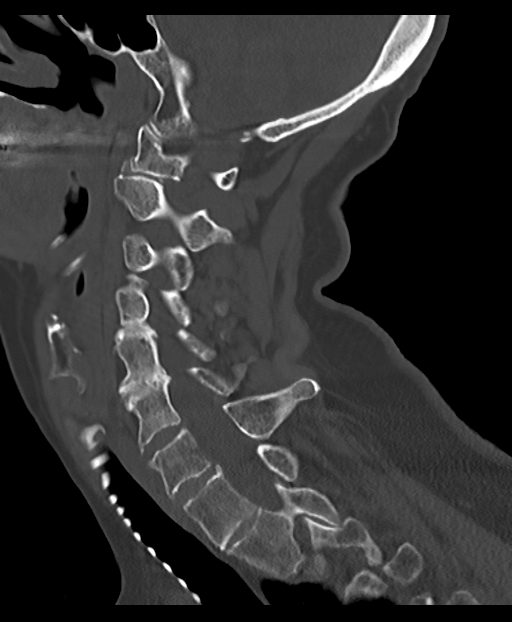
[im 36/45  bone]
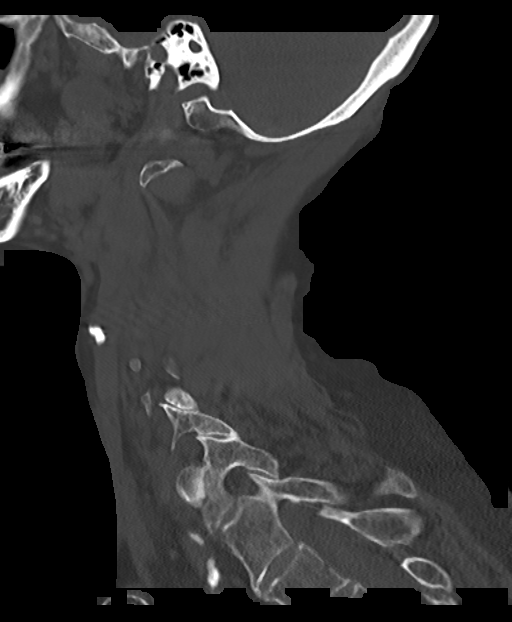

[Series 11: cor bone · coronal · 0.34mm/px · 1 of 49 slices shown]
[im 25/49  bone]
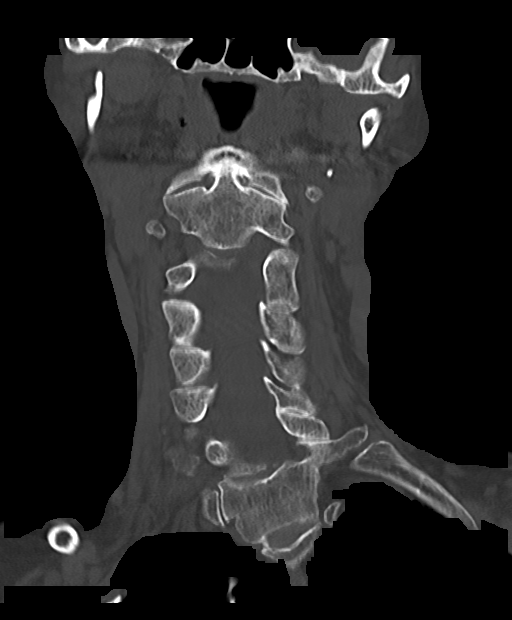

[Series 12: orthogonal axials · axial · 0.21mm/px · z∈[-252,-181]mm · 2 of 89 slices shown, 3 images]
[im 30/89  soft-tissue]
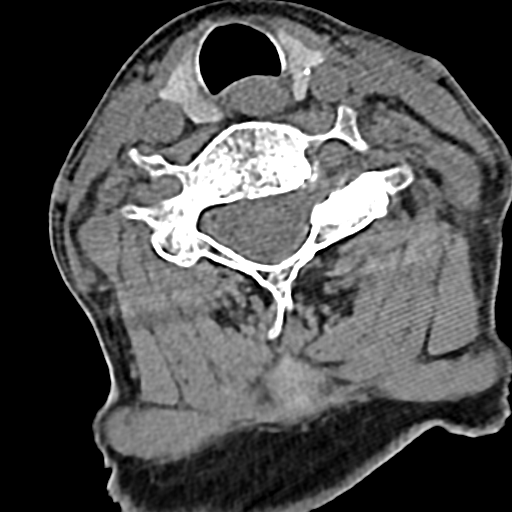
[im 30/89  bone]
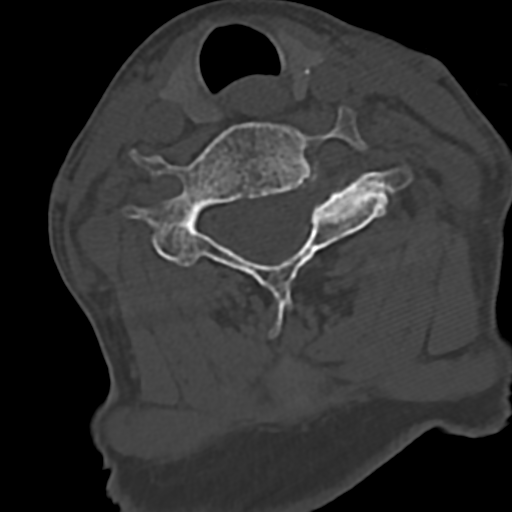
[im 59/89  bone]
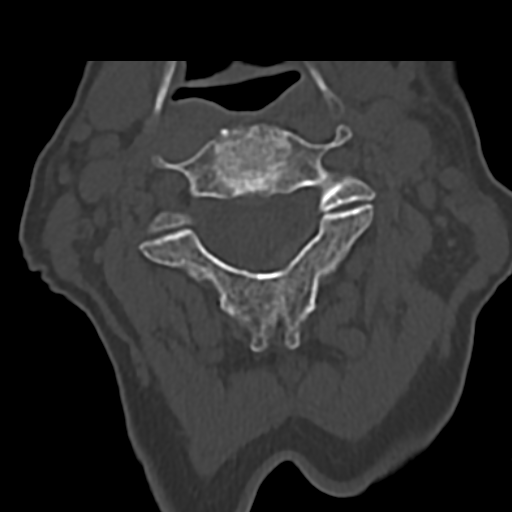

[11 of 33 positions shown; findings below may reference images not displayed]

FINDINGS: CT HEAD FINDINGS

Brain: No evidence of acute infarction, hemorrhage, hydrocephalus,
extra-axial collection or mass lesion/mass effect. Stable moderate
chronic microvascular ischemic changes and parenchymal volume loss
of the brain given differences in technique. Small chronic lacunar
infarction within the left thalamus.

Vascular: Calcific atherosclerosis of carotid siphons. No hyperdense
vessel identified.

Skull: Right parietal region small scalp contusion. No calvarial
fracture.

Sinuses/Orbits: Left maxillary sinus extensive mucosal thickening in
chronic inflammatory changes of the sinus walls. Bilateral
intra-ocular lens replacement. Otherwise negative.

Other: None.

CT CERVICAL SPINE FINDINGS

Alignment: Normal cervical lordosis without listhesis. Leftward
curvature at cervicothoracic junction.

Skull base and vertebrae: No acute fracture. No primary bone lesion
or focal pathologic process.

Soft tissues and spinal canal: No prevertebral fluid or swelling. No
visible canal hematoma.

Disc levels: Multilevel disc and facet degenerative changes greatest
at the C4-5 and C5-6 levels. Uncovertebral and facet hypertrophy
results in moderate bilateral bony foraminal stenosis at the C5-6
level and mild left-sided stenosis at the C4-5 level. 5 mm central
C4-5 disc protrusion mild-to-moderate canal stenosis and anterior
cord impingement.

Upper chest: Biapical pleuroparenchymal scarring.

Other: Negative.
IMPRESSION: CT head:

1. No acute intracranial abnormality or calvarial fracture
identified.
2. Right parietal region small scalp contusion.
3. Stable moderate chronic microvascular ischemic changes and
parenchymal volume loss of the brain given differences in technique.
4. Extensive left chronic maxillary sinus disease.

CT cervical spine:

1. No acute fracture or dislocation identified.
2. Cervical spondylosis greatest at C4-5 and C5-6 levels.
3. C4-5 central disc protrusion with mild-to-moderate canal stenosis
and anterior cord impingement.

By: Andrei Pandey M.D.

## 2020-01-26 DIAGNOSIS — Z1159 Encounter for screening for other viral diseases: Secondary | ICD-10-CM | POA: Diagnosis not present

## 2020-01-26 DIAGNOSIS — Z20828 Contact with and (suspected) exposure to other viral communicable diseases: Secondary | ICD-10-CM | POA: Diagnosis not present

## 2020-01-30 DIAGNOSIS — Z1159 Encounter for screening for other viral diseases: Secondary | ICD-10-CM | POA: Diagnosis not present

## 2020-01-30 DIAGNOSIS — Z20828 Contact with and (suspected) exposure to other viral communicable diseases: Secondary | ICD-10-CM | POA: Diagnosis not present

## 2020-02-06 DIAGNOSIS — Z20828 Contact with and (suspected) exposure to other viral communicable diseases: Secondary | ICD-10-CM | POA: Diagnosis not present

## 2020-02-06 DIAGNOSIS — Z1159 Encounter for screening for other viral diseases: Secondary | ICD-10-CM | POA: Diagnosis not present

## 2020-02-09 DIAGNOSIS — Z1159 Encounter for screening for other viral diseases: Secondary | ICD-10-CM | POA: Diagnosis not present

## 2020-02-09 DIAGNOSIS — Z20828 Contact with and (suspected) exposure to other viral communicable diseases: Secondary | ICD-10-CM | POA: Diagnosis not present

## 2020-02-13 DIAGNOSIS — Z20828 Contact with and (suspected) exposure to other viral communicable diseases: Secondary | ICD-10-CM | POA: Diagnosis not present

## 2020-02-13 DIAGNOSIS — Z1159 Encounter for screening for other viral diseases: Secondary | ICD-10-CM | POA: Diagnosis not present

## 2020-02-16 DIAGNOSIS — Z20828 Contact with and (suspected) exposure to other viral communicable diseases: Secondary | ICD-10-CM | POA: Diagnosis not present

## 2020-02-16 DIAGNOSIS — Z1159 Encounter for screening for other viral diseases: Secondary | ICD-10-CM | POA: Diagnosis not present

## 2020-02-20 DIAGNOSIS — Z1159 Encounter for screening for other viral diseases: Secondary | ICD-10-CM | POA: Diagnosis not present

## 2020-02-20 DIAGNOSIS — Z20828 Contact with and (suspected) exposure to other viral communicable diseases: Secondary | ICD-10-CM | POA: Diagnosis not present

## 2020-02-22 ENCOUNTER — Encounter: Payer: Self-pay | Admitting: Podiatry

## 2020-02-22 ENCOUNTER — Other Ambulatory Visit: Payer: Self-pay

## 2020-02-22 ENCOUNTER — Ambulatory Visit (INDEPENDENT_AMBULATORY_CARE_PROVIDER_SITE_OTHER): Payer: Medicare Other | Admitting: Podiatry

## 2020-02-22 DIAGNOSIS — I872 Venous insufficiency (chronic) (peripheral): Secondary | ICD-10-CM

## 2020-02-22 DIAGNOSIS — B351 Tinea unguium: Secondary | ICD-10-CM | POA: Diagnosis not present

## 2020-02-22 DIAGNOSIS — M79676 Pain in unspecified toe(s): Secondary | ICD-10-CM | POA: Diagnosis not present

## 2020-02-22 NOTE — Progress Notes (Signed)
This patient returns to the office for evaluation and treatment of long thick painful nails .  This patient is unable to trim her own nails since the patient cannot reach her  feet.  Patient says the nails are painful walking and wearing his shoes.  She returns for preventive foot care services.  She presents to the office with her daughter.  General Appearance  Alert, conversant and in no acute stress.  Vascular  Dorsalis pedis and posterior tibial  pulses are palpable  bilaterally.  Capillary return is within normal limits  bilaterally. Temperature is within normal limits  bilaterally.  Neurologic  Senn-Weinstein monofilament wire test within normal limits  bilaterally. Muscle power within normal limits bilaterally.  Nails Thick disfigured discolored nails with subungual debris  from hallux to fifth toes bilaterally. No evidence of bacterial infection or drainage bilaterally.  Orthopedic  No limitations of motion  feet .  No crepitus or effusions noted.  No bony pathology or digital deformities noted.  HAV 1st MPJ right foot.  Skin  normotropic skin with no porokeratosis noted bilaterally.  No signs of infections or ulcers noted.  Asymptomatic  HD 5th toe left foot.   Onychomycosis  Pain in toes right foot  Pain in toes left foot  Debridement  of nails  1-5  B/L with a nail nipper.  Nails were then filed using a dremel tool with no incidents.    RTC  prn   Aislynn Cifelli DPM  

## 2020-02-23 DIAGNOSIS — Z1159 Encounter for screening for other viral diseases: Secondary | ICD-10-CM | POA: Diagnosis not present

## 2020-02-23 DIAGNOSIS — Z20828 Contact with and (suspected) exposure to other viral communicable diseases: Secondary | ICD-10-CM | POA: Diagnosis not present

## 2020-02-27 DIAGNOSIS — Z20828 Contact with and (suspected) exposure to other viral communicable diseases: Secondary | ICD-10-CM | POA: Diagnosis not present

## 2020-02-27 DIAGNOSIS — Z1159 Encounter for screening for other viral diseases: Secondary | ICD-10-CM | POA: Diagnosis not present

## 2020-03-01 DIAGNOSIS — Z20828 Contact with and (suspected) exposure to other viral communicable diseases: Secondary | ICD-10-CM | POA: Diagnosis not present

## 2020-03-01 DIAGNOSIS — Z1159 Encounter for screening for other viral diseases: Secondary | ICD-10-CM | POA: Diagnosis not present

## 2020-03-01 DIAGNOSIS — B351 Tinea unguium: Secondary | ICD-10-CM | POA: Diagnosis not present

## 2020-03-01 DIAGNOSIS — M2041 Other hammer toe(s) (acquired), right foot: Secondary | ICD-10-CM | POA: Diagnosis not present

## 2020-03-05 DIAGNOSIS — I10 Essential (primary) hypertension: Secondary | ICD-10-CM | POA: Diagnosis not present

## 2020-03-05 DIAGNOSIS — Z1389 Encounter for screening for other disorder: Secondary | ICD-10-CM | POA: Diagnosis not present

## 2020-03-05 DIAGNOSIS — G301 Alzheimer's disease with late onset: Secondary | ICD-10-CM | POA: Diagnosis not present

## 2020-03-05 DIAGNOSIS — Z20828 Contact with and (suspected) exposure to other viral communicable diseases: Secondary | ICD-10-CM | POA: Diagnosis not present

## 2020-03-05 DIAGNOSIS — F0280 Dementia in other diseases classified elsewhere without behavioral disturbance: Secondary | ICD-10-CM | POA: Diagnosis not present

## 2020-03-05 DIAGNOSIS — Z0000 Encounter for general adult medical examination without abnormal findings: Secondary | ICD-10-CM | POA: Diagnosis not present

## 2020-03-05 DIAGNOSIS — Z1159 Encounter for screening for other viral diseases: Secondary | ICD-10-CM | POA: Diagnosis not present

## 2020-03-12 DIAGNOSIS — Z1159 Encounter for screening for other viral diseases: Secondary | ICD-10-CM | POA: Diagnosis not present

## 2020-03-12 DIAGNOSIS — Z20828 Contact with and (suspected) exposure to other viral communicable diseases: Secondary | ICD-10-CM | POA: Diagnosis not present

## 2020-03-15 DIAGNOSIS — Z20828 Contact with and (suspected) exposure to other viral communicable diseases: Secondary | ICD-10-CM | POA: Diagnosis not present

## 2020-03-15 DIAGNOSIS — Z1159 Encounter for screening for other viral diseases: Secondary | ICD-10-CM | POA: Diagnosis not present

## 2020-03-19 DIAGNOSIS — Z20828 Contact with and (suspected) exposure to other viral communicable diseases: Secondary | ICD-10-CM | POA: Diagnosis not present

## 2020-03-19 DIAGNOSIS — Z1159 Encounter for screening for other viral diseases: Secondary | ICD-10-CM | POA: Diagnosis not present

## 2020-03-22 DIAGNOSIS — Z20828 Contact with and (suspected) exposure to other viral communicable diseases: Secondary | ICD-10-CM | POA: Diagnosis not present

## 2020-03-22 DIAGNOSIS — Z1159 Encounter for screening for other viral diseases: Secondary | ICD-10-CM | POA: Diagnosis not present

## 2020-03-26 DIAGNOSIS — Z1159 Encounter for screening for other viral diseases: Secondary | ICD-10-CM | POA: Diagnosis not present

## 2020-03-26 DIAGNOSIS — Z20828 Contact with and (suspected) exposure to other viral communicable diseases: Secondary | ICD-10-CM | POA: Diagnosis not present

## 2020-03-29 DIAGNOSIS — Z1159 Encounter for screening for other viral diseases: Secondary | ICD-10-CM | POA: Diagnosis not present

## 2020-03-29 DIAGNOSIS — Z20828 Contact with and (suspected) exposure to other viral communicable diseases: Secondary | ICD-10-CM | POA: Diagnosis not present

## 2020-04-02 DIAGNOSIS — Z20828 Contact with and (suspected) exposure to other viral communicable diseases: Secondary | ICD-10-CM | POA: Diagnosis not present

## 2020-04-02 DIAGNOSIS — Z1159 Encounter for screening for other viral diseases: Secondary | ICD-10-CM | POA: Diagnosis not present

## 2020-04-05 DIAGNOSIS — Z20828 Contact with and (suspected) exposure to other viral communicable diseases: Secondary | ICD-10-CM | POA: Diagnosis not present

## 2020-04-05 DIAGNOSIS — Z1159 Encounter for screening for other viral diseases: Secondary | ICD-10-CM | POA: Diagnosis not present

## 2020-04-09 DIAGNOSIS — Z20828 Contact with and (suspected) exposure to other viral communicable diseases: Secondary | ICD-10-CM | POA: Diagnosis not present

## 2020-04-09 DIAGNOSIS — Z1159 Encounter for screening for other viral diseases: Secondary | ICD-10-CM | POA: Diagnosis not present

## 2020-04-12 DIAGNOSIS — Z1159 Encounter for screening for other viral diseases: Secondary | ICD-10-CM | POA: Diagnosis not present

## 2020-04-12 DIAGNOSIS — Z20828 Contact with and (suspected) exposure to other viral communicable diseases: Secondary | ICD-10-CM | POA: Diagnosis not present

## 2020-04-16 DIAGNOSIS — Z20828 Contact with and (suspected) exposure to other viral communicable diseases: Secondary | ICD-10-CM | POA: Diagnosis not present

## 2020-04-16 DIAGNOSIS — Z1159 Encounter for screening for other viral diseases: Secondary | ICD-10-CM | POA: Diagnosis not present

## 2020-04-19 DIAGNOSIS — Z20828 Contact with and (suspected) exposure to other viral communicable diseases: Secondary | ICD-10-CM | POA: Diagnosis not present

## 2020-04-19 DIAGNOSIS — Z1159 Encounter for screening for other viral diseases: Secondary | ICD-10-CM | POA: Diagnosis not present

## 2020-04-23 DIAGNOSIS — Z1159 Encounter for screening for other viral diseases: Secondary | ICD-10-CM | POA: Diagnosis not present

## 2020-04-23 DIAGNOSIS — Z20828 Contact with and (suspected) exposure to other viral communicable diseases: Secondary | ICD-10-CM | POA: Diagnosis not present

## 2020-04-26 DIAGNOSIS — Z20828 Contact with and (suspected) exposure to other viral communicable diseases: Secondary | ICD-10-CM | POA: Diagnosis not present

## 2020-04-26 DIAGNOSIS — Z1159 Encounter for screening for other viral diseases: Secondary | ICD-10-CM | POA: Diagnosis not present

## 2020-04-30 DIAGNOSIS — Z1159 Encounter for screening for other viral diseases: Secondary | ICD-10-CM | POA: Diagnosis not present

## 2020-04-30 DIAGNOSIS — Z20828 Contact with and (suspected) exposure to other viral communicable diseases: Secondary | ICD-10-CM | POA: Diagnosis not present

## 2020-05-03 DIAGNOSIS — Z1159 Encounter for screening for other viral diseases: Secondary | ICD-10-CM | POA: Diagnosis not present

## 2020-05-03 DIAGNOSIS — Z20828 Contact with and (suspected) exposure to other viral communicable diseases: Secondary | ICD-10-CM | POA: Diagnosis not present

## 2020-05-07 DIAGNOSIS — Z20828 Contact with and (suspected) exposure to other viral communicable diseases: Secondary | ICD-10-CM | POA: Diagnosis not present

## 2020-05-07 DIAGNOSIS — Z1159 Encounter for screening for other viral diseases: Secondary | ICD-10-CM | POA: Diagnosis not present

## 2020-05-10 DIAGNOSIS — L858 Other specified epidermal thickening: Secondary | ICD-10-CM | POA: Diagnosis not present

## 2020-05-10 DIAGNOSIS — F0280 Dementia in other diseases classified elsewhere without behavioral disturbance: Secondary | ICD-10-CM | POA: Diagnosis not present

## 2020-05-10 DIAGNOSIS — I10 Essential (primary) hypertension: Secondary | ICD-10-CM | POA: Diagnosis not present

## 2020-05-10 DIAGNOSIS — B372 Candidiasis of skin and nail: Secondary | ICD-10-CM | POA: Diagnosis not present

## 2020-05-10 DIAGNOSIS — G301 Alzheimer's disease with late onset: Secondary | ICD-10-CM | POA: Diagnosis not present

## 2020-05-14 DIAGNOSIS — Z20828 Contact with and (suspected) exposure to other viral communicable diseases: Secondary | ICD-10-CM | POA: Diagnosis not present

## 2020-05-14 DIAGNOSIS — Z1159 Encounter for screening for other viral diseases: Secondary | ICD-10-CM | POA: Diagnosis not present

## 2020-05-17 DIAGNOSIS — Z20828 Contact with and (suspected) exposure to other viral communicable diseases: Secondary | ICD-10-CM | POA: Diagnosis not present

## 2020-05-17 DIAGNOSIS — Z1159 Encounter for screening for other viral diseases: Secondary | ICD-10-CM | POA: Diagnosis not present

## 2020-05-21 DIAGNOSIS — Z20828 Contact with and (suspected) exposure to other viral communicable diseases: Secondary | ICD-10-CM | POA: Diagnosis not present

## 2020-05-21 DIAGNOSIS — Z1159 Encounter for screening for other viral diseases: Secondary | ICD-10-CM | POA: Diagnosis not present

## 2020-05-24 DIAGNOSIS — Z1159 Encounter for screening for other viral diseases: Secondary | ICD-10-CM | POA: Diagnosis not present

## 2020-05-24 DIAGNOSIS — Z20828 Contact with and (suspected) exposure to other viral communicable diseases: Secondary | ICD-10-CM | POA: Diagnosis not present

## 2020-05-29 ENCOUNTER — Encounter: Payer: Self-pay | Admitting: Podiatry

## 2020-05-29 ENCOUNTER — Ambulatory Visit (INDEPENDENT_AMBULATORY_CARE_PROVIDER_SITE_OTHER): Payer: Medicare Other | Admitting: Podiatry

## 2020-05-29 ENCOUNTER — Other Ambulatory Visit: Payer: Self-pay

## 2020-05-29 DIAGNOSIS — M79676 Pain in unspecified toe(s): Secondary | ICD-10-CM

## 2020-05-29 DIAGNOSIS — B351 Tinea unguium: Secondary | ICD-10-CM | POA: Diagnosis not present

## 2020-05-29 DIAGNOSIS — L84 Corns and callosities: Secondary | ICD-10-CM | POA: Diagnosis not present

## 2020-05-29 DIAGNOSIS — I872 Venous insufficiency (chronic) (peripheral): Secondary | ICD-10-CM

## 2020-05-29 NOTE — Progress Notes (Signed)
This patient returns to the office for evaluation and treatment of long thick painful nails .  This patient is unable to trim her own nails since the patient cannot reach her  feet.  Patient says the nails are painful walking and wearing his shoes.  She returns for preventive foot care services.  She presents to the office with her daughter.  General Appearance  Alert, conversant and in no acute stress.  Vascular  Dorsalis pedis and posterior tibial  pulses are palpable  bilaterally.  Capillary return is within normal limits  bilaterally. Temperature is within normal limits  bilaterally.  Neurologic  Senn-Weinstein monofilament wire test within normal limits  bilaterally. Muscle power within normal limits bilaterally.  Nails Thick disfigured discolored nails with subungual debris  from hallux to fifth toes bilaterally. No evidence of bacterial infection or drainage bilaterally.  Orthopedic  No limitations of motion  feet .  No crepitus or effusions noted.  No bony pathology or digital deformities noted.  HAV 1st MPJ right foot.  Skin  normotropic skin with no porokeratosis noted bilaterally.  No signs of infections or ulcers noted.  Asymptomatic  HD 5th toe left foot.   Onychomycosis  Pain in toes right foot  Pain in toes left foot  Debridement  of nails  1-5  B/L with a nail nipper.  Nails were then filed using a dremel tool with no incidents.    RTC  prn   Gardiner Barefoot DPM

## 2020-05-31 DIAGNOSIS — Z20828 Contact with and (suspected) exposure to other viral communicable diseases: Secondary | ICD-10-CM | POA: Diagnosis not present

## 2020-05-31 DIAGNOSIS — Z1159 Encounter for screening for other viral diseases: Secondary | ICD-10-CM | POA: Diagnosis not present

## 2020-06-04 DIAGNOSIS — Z1159 Encounter for screening for other viral diseases: Secondary | ICD-10-CM | POA: Diagnosis not present

## 2020-06-04 DIAGNOSIS — Z20828 Contact with and (suspected) exposure to other viral communicable diseases: Secondary | ICD-10-CM | POA: Diagnosis not present

## 2020-06-11 DIAGNOSIS — Z20828 Contact with and (suspected) exposure to other viral communicable diseases: Secondary | ICD-10-CM | POA: Diagnosis not present

## 2020-06-11 DIAGNOSIS — Z1159 Encounter for screening for other viral diseases: Secondary | ICD-10-CM | POA: Diagnosis not present

## 2020-06-19 DIAGNOSIS — B351 Tinea unguium: Secondary | ICD-10-CM | POA: Diagnosis not present

## 2020-06-19 DIAGNOSIS — M2041 Other hammer toe(s) (acquired), right foot: Secondary | ICD-10-CM | POA: Diagnosis not present

## 2020-06-19 DIAGNOSIS — L84 Corns and callosities: Secondary | ICD-10-CM | POA: Diagnosis not present

## 2020-06-21 DIAGNOSIS — Z1159 Encounter for screening for other viral diseases: Secondary | ICD-10-CM | POA: Diagnosis not present

## 2020-06-21 DIAGNOSIS — Z20828 Contact with and (suspected) exposure to other viral communicable diseases: Secondary | ICD-10-CM | POA: Diagnosis not present

## 2020-06-25 DIAGNOSIS — Z20828 Contact with and (suspected) exposure to other viral communicable diseases: Secondary | ICD-10-CM | POA: Diagnosis not present

## 2020-06-25 DIAGNOSIS — Z1159 Encounter for screening for other viral diseases: Secondary | ICD-10-CM | POA: Diagnosis not present

## 2020-06-28 DIAGNOSIS — Z1159 Encounter for screening for other viral diseases: Secondary | ICD-10-CM | POA: Diagnosis not present

## 2020-06-28 DIAGNOSIS — Z20828 Contact with and (suspected) exposure to other viral communicable diseases: Secondary | ICD-10-CM | POA: Diagnosis not present

## 2020-07-02 DIAGNOSIS — Z20828 Contact with and (suspected) exposure to other viral communicable diseases: Secondary | ICD-10-CM | POA: Diagnosis not present

## 2020-07-02 DIAGNOSIS — Z1159 Encounter for screening for other viral diseases: Secondary | ICD-10-CM | POA: Diagnosis not present

## 2020-07-05 DIAGNOSIS — Z20828 Contact with and (suspected) exposure to other viral communicable diseases: Secondary | ICD-10-CM | POA: Diagnosis not present

## 2020-07-05 DIAGNOSIS — Z1159 Encounter for screening for other viral diseases: Secondary | ICD-10-CM | POA: Diagnosis not present

## 2020-07-09 DIAGNOSIS — Z1159 Encounter for screening for other viral diseases: Secondary | ICD-10-CM | POA: Diagnosis not present

## 2020-07-09 DIAGNOSIS — Z20828 Contact with and (suspected) exposure to other viral communicable diseases: Secondary | ICD-10-CM | POA: Diagnosis not present

## 2020-07-12 DIAGNOSIS — Z1159 Encounter for screening for other viral diseases: Secondary | ICD-10-CM | POA: Diagnosis not present

## 2020-07-12 DIAGNOSIS — Z20828 Contact with and (suspected) exposure to other viral communicable diseases: Secondary | ICD-10-CM | POA: Diagnosis not present

## 2020-07-16 DIAGNOSIS — Z20828 Contact with and (suspected) exposure to other viral communicable diseases: Secondary | ICD-10-CM | POA: Diagnosis not present

## 2020-07-16 DIAGNOSIS — Z1159 Encounter for screening for other viral diseases: Secondary | ICD-10-CM | POA: Diagnosis not present

## 2020-07-19 DIAGNOSIS — Z20828 Contact with and (suspected) exposure to other viral communicable diseases: Secondary | ICD-10-CM | POA: Diagnosis not present

## 2020-07-19 DIAGNOSIS — Z1159 Encounter for screening for other viral diseases: Secondary | ICD-10-CM | POA: Diagnosis not present

## 2020-07-26 DIAGNOSIS — Z20828 Contact with and (suspected) exposure to other viral communicable diseases: Secondary | ICD-10-CM | POA: Diagnosis not present

## 2020-07-26 DIAGNOSIS — Z1159 Encounter for screening for other viral diseases: Secondary | ICD-10-CM | POA: Diagnosis not present

## 2020-07-30 DIAGNOSIS — Z1159 Encounter for screening for other viral diseases: Secondary | ICD-10-CM | POA: Diagnosis not present

## 2020-07-30 DIAGNOSIS — Z20828 Contact with and (suspected) exposure to other viral communicable diseases: Secondary | ICD-10-CM | POA: Diagnosis not present

## 2020-08-02 DIAGNOSIS — Z20828 Contact with and (suspected) exposure to other viral communicable diseases: Secondary | ICD-10-CM | POA: Diagnosis not present

## 2020-08-02 DIAGNOSIS — Z1159 Encounter for screening for other viral diseases: Secondary | ICD-10-CM | POA: Diagnosis not present

## 2020-08-06 DIAGNOSIS — Z20828 Contact with and (suspected) exposure to other viral communicable diseases: Secondary | ICD-10-CM | POA: Diagnosis not present

## 2020-08-06 DIAGNOSIS — Z1159 Encounter for screening for other viral diseases: Secondary | ICD-10-CM | POA: Diagnosis not present

## 2020-08-13 DIAGNOSIS — Z1159 Encounter for screening for other viral diseases: Secondary | ICD-10-CM | POA: Diagnosis not present

## 2020-08-13 DIAGNOSIS — Z20828 Contact with and (suspected) exposure to other viral communicable diseases: Secondary | ICD-10-CM | POA: Diagnosis not present

## 2020-08-16 DIAGNOSIS — Z20828 Contact with and (suspected) exposure to other viral communicable diseases: Secondary | ICD-10-CM | POA: Diagnosis not present

## 2020-08-16 DIAGNOSIS — Z1159 Encounter for screening for other viral diseases: Secondary | ICD-10-CM | POA: Diagnosis not present

## 2020-08-20 DIAGNOSIS — Z20828 Contact with and (suspected) exposure to other viral communicable diseases: Secondary | ICD-10-CM | POA: Diagnosis not present

## 2020-08-20 DIAGNOSIS — Z1159 Encounter for screening for other viral diseases: Secondary | ICD-10-CM | POA: Diagnosis not present

## 2020-08-23 DIAGNOSIS — Z1159 Encounter for screening for other viral diseases: Secondary | ICD-10-CM | POA: Diagnosis not present

## 2020-08-23 DIAGNOSIS — Z20828 Contact with and (suspected) exposure to other viral communicable diseases: Secondary | ICD-10-CM | POA: Diagnosis not present

## 2020-08-27 DIAGNOSIS — Z1159 Encounter for screening for other viral diseases: Secondary | ICD-10-CM | POA: Diagnosis not present

## 2020-08-27 DIAGNOSIS — Z20828 Contact with and (suspected) exposure to other viral communicable diseases: Secondary | ICD-10-CM | POA: Diagnosis not present

## 2020-08-28 ENCOUNTER — Ambulatory Visit (INDEPENDENT_AMBULATORY_CARE_PROVIDER_SITE_OTHER): Payer: Medicare Other | Admitting: Podiatry

## 2020-08-28 ENCOUNTER — Other Ambulatory Visit: Payer: Self-pay

## 2020-08-28 ENCOUNTER — Encounter: Payer: Self-pay | Admitting: Podiatry

## 2020-08-28 DIAGNOSIS — B351 Tinea unguium: Secondary | ICD-10-CM | POA: Diagnosis not present

## 2020-08-28 DIAGNOSIS — G301 Alzheimer's disease with late onset: Secondary | ICD-10-CM | POA: Diagnosis not present

## 2020-08-28 DIAGNOSIS — I872 Venous insufficiency (chronic) (peripheral): Secondary | ICD-10-CM

## 2020-08-28 DIAGNOSIS — M79676 Pain in unspecified toe(s): Secondary | ICD-10-CM

## 2020-08-28 DIAGNOSIS — I10 Essential (primary) hypertension: Secondary | ICD-10-CM | POA: Diagnosis not present

## 2020-08-28 DIAGNOSIS — F0280 Dementia in other diseases classified elsewhere without behavioral disturbance: Secondary | ICD-10-CM | POA: Diagnosis not present

## 2020-08-28 DIAGNOSIS — M7061 Trochanteric bursitis, right hip: Secondary | ICD-10-CM | POA: Diagnosis not present

## 2020-08-28 DIAGNOSIS — B372 Candidiasis of skin and nail: Secondary | ICD-10-CM | POA: Diagnosis not present

## 2020-08-28 DIAGNOSIS — L84 Corns and callosities: Secondary | ICD-10-CM

## 2020-08-28 DIAGNOSIS — H00015 Hordeolum externum left lower eyelid: Secondary | ICD-10-CM | POA: Diagnosis not present

## 2020-08-28 NOTE — Progress Notes (Signed)
This patient returns to the office for evaluation and treatment of long thick painful nails .  This patient is unable to trim her own nails since the patient cannot reach her  feet.  Patient says the nails are painful walking and wearing his shoes.  She returns for preventive foot care services.  She presents to the office with her daughter.  General Appearance  Alert, conversant and in no acute stress.  Vascular  Dorsalis pedis and posterior tibial  pulses are palpable  bilaterally.  Capillary return is within normal limits  bilaterally. Temperature is within normal limits  bilaterally.  Neurologic  Senn-Weinstein monofilament wire test within normal limits  bilaterally. Muscle power within normal limits bilaterally.  Nails Thick disfigured discolored nails with subungual debris  from hallux to fifth toes bilaterally. No evidence of bacterial infection or drainage bilaterally.  Orthopedic  No limitations of motion  feet .  No crepitus or effusions noted.  No bony pathology or digital deformities noted.  HAV 1st MPJ right foot.  Skin  normotropic skin with no porokeratosis noted bilaterally.  No signs of infections or ulcers noted.  Symptomatic  HD 5th toe left foot.   Onychomycosis  Pain in toes right foot  Pain in toes left foot  Debridement  of nails  1-5  B/L with a nail nipper.  Nails were then filed using a dremel tool with no incidents.    RTC  4 months   Gardiner Barefoot DPM

## 2020-08-30 DIAGNOSIS — Z20828 Contact with and (suspected) exposure to other viral communicable diseases: Secondary | ICD-10-CM | POA: Diagnosis not present

## 2020-08-30 DIAGNOSIS — Z1159 Encounter for screening for other viral diseases: Secondary | ICD-10-CM | POA: Diagnosis not present

## 2020-09-03 DIAGNOSIS — Z1159 Encounter for screening for other viral diseases: Secondary | ICD-10-CM | POA: Diagnosis not present

## 2020-09-03 DIAGNOSIS — Z20828 Contact with and (suspected) exposure to other viral communicable diseases: Secondary | ICD-10-CM | POA: Diagnosis not present

## 2020-09-04 ENCOUNTER — Encounter (HOSPITAL_COMMUNITY): Payer: Self-pay

## 2020-09-04 ENCOUNTER — Emergency Department (HOSPITAL_COMMUNITY): Payer: Medicare Other

## 2020-09-04 ENCOUNTER — Emergency Department (HOSPITAL_COMMUNITY)
Admission: EM | Admit: 2020-09-04 | Discharge: 2020-09-05 | Disposition: A | Discharge status: Home / Self Care | Payer: Medicare Other | Attending: Emergency Medicine | Admitting: Emergency Medicine

## 2020-09-04 ENCOUNTER — Other Ambulatory Visit: Payer: Self-pay

## 2020-09-04 DIAGNOSIS — R110 Nausea: Secondary | ICD-10-CM | POA: Diagnosis not present

## 2020-09-04 DIAGNOSIS — Z90710 Acquired absence of both cervix and uterus: Secondary | ICD-10-CM | POA: Diagnosis not present

## 2020-09-04 DIAGNOSIS — R404 Transient alteration of awareness: Secondary | ICD-10-CM

## 2020-09-04 DIAGNOSIS — I959 Hypotension, unspecified: Secondary | ICD-10-CM | POA: Diagnosis not present

## 2020-09-04 DIAGNOSIS — R197 Diarrhea, unspecified: Secondary | ICD-10-CM | POA: Diagnosis not present

## 2020-09-04 DIAGNOSIS — R1013 Epigastric pain: Secondary | ICD-10-CM | POA: Diagnosis not present

## 2020-09-04 DIAGNOSIS — I10 Essential (primary) hypertension: Secondary | ICD-10-CM | POA: Insufficient documentation

## 2020-09-04 DIAGNOSIS — K5750 Diverticulosis of both small and large intestine without perforation or abscess without bleeding: Secondary | ICD-10-CM | POA: Diagnosis not present

## 2020-09-04 DIAGNOSIS — N3000 Acute cystitis without hematuria: Secondary | ICD-10-CM | POA: Insufficient documentation

## 2020-09-04 DIAGNOSIS — G459 Transient cerebral ischemic attack, unspecified: Secondary | ICD-10-CM | POA: Diagnosis not present

## 2020-09-04 DIAGNOSIS — I700 Atherosclerosis of aorta: Secondary | ICD-10-CM | POA: Diagnosis not present

## 2020-09-04 DIAGNOSIS — R1011 Right upper quadrant pain: Secondary | ICD-10-CM | POA: Diagnosis not present

## 2020-09-04 DIAGNOSIS — F0390 Unspecified dementia without behavioral disturbance: Secondary | ICD-10-CM | POA: Insufficient documentation

## 2020-09-04 DIAGNOSIS — Z96611 Presence of right artificial shoulder joint: Secondary | ICD-10-CM | POA: Insufficient documentation

## 2020-09-04 DIAGNOSIS — R4182 Altered mental status, unspecified: Secondary | ICD-10-CM | POA: Diagnosis not present

## 2020-09-04 DIAGNOSIS — R0902 Hypoxemia: Secondary | ICD-10-CM | POA: Diagnosis not present

## 2020-09-04 LAB — COMPREHENSIVE METABOLIC PANEL
ALT: 17 U/L (ref 0–44)
AST: 50 U/L — ABNORMAL HIGH (ref 15–41)
Albumin: 4.6 g/dL (ref 3.5–5.0)
Alkaline Phosphatase: 86 U/L (ref 38–126)
Anion gap: 8 (ref 5–15)
BUN: 33 mg/dL — ABNORMAL HIGH (ref 8–23)
CO2: 29 mmol/L (ref 22–32)
Calcium: 10 mg/dL (ref 8.9–10.3)
Chloride: 102 mmol/L (ref 98–111)
Creatinine, Ser: 1.18 mg/dL — ABNORMAL HIGH (ref 0.44–1.00)
GFR, Estimated: 44 mL/min — ABNORMAL LOW (ref >60)
Glucose, Bld: 111 mg/dL — ABNORMAL HIGH (ref 70–99)
Potassium: 4.8 mmol/L (ref 3.5–5.1)
Sodium: 139 mmol/L (ref 135–145)
Total Bilirubin: 0.5 mg/dL (ref 0.3–1.2)
Total Protein: 8.7 g/dL — ABNORMAL HIGH (ref 6.5–8.1)

## 2020-09-04 LAB — TROPONIN I (HIGH SENSITIVITY)
Troponin I (High Sensitivity): 6 ng/L (ref <18)
Troponin I (High Sensitivity): 6 ng/L (ref <18)

## 2020-09-04 LAB — CBC WITH DIFFERENTIAL/PLATELET
Abs Immature Granulocytes: 0.03 10*3/uL (ref 0.00–0.07)
Basophils Absolute: 0 10*3/uL (ref 0.0–0.1)
Basophils Relative: 0 %
Eosinophils Absolute: 0 10*3/uL (ref 0.0–0.5)
Eosinophils Relative: 0 %
HCT: 41.4 % (ref 36.0–46.0)
Hemoglobin: 13.3 g/dL (ref 12.0–15.0)
Immature Granulocytes: 0 %
Lymphocytes Relative: 13 %
Lymphs Abs: 1.3 10*3/uL (ref 0.7–4.0)
MCH: 31.8 pg (ref 26.0–34.0)
MCHC: 32.1 g/dL (ref 30.0–36.0)
MCV: 99 fL (ref 80.0–100.0)
Monocytes Absolute: 0.7 10*3/uL (ref 0.1–1.0)
Monocytes Relative: 7 %
Neutro Abs: 7.9 10*3/uL — ABNORMAL HIGH (ref 1.7–7.7)
Neutrophils Relative %: 80 %
Platelets: 210 10*3/uL (ref 150–400)
RBC: 4.18 MIL/uL (ref 3.87–5.11)
RDW: 12 % (ref 11.5–15.5)
WBC: 10 10*3/uL (ref 4.0–10.5)
nRBC: 0 % (ref 0.0–0.2)

## 2020-09-04 LAB — PROTIME-INR
INR: 0.9 (ref 0.8–1.2)
Prothrombin Time: 12.5 seconds (ref 11.4–15.2)

## 2020-09-04 LAB — LIPASE, BLOOD: Lipase: 65 U/L — ABNORMAL HIGH (ref 11–51)

## 2020-09-04 MED ORDER — SODIUM CHLORIDE 0.9 % IV BOLUS
500.0000 mL | Freq: Once | INTRAVENOUS | Status: AC
Start: 2020-09-04 — End: 2020-09-05
  Administered 2020-09-04: 500 mL via INTRAVENOUS

## 2020-09-04 NOTE — ED Provider Notes (Signed)
[facility] DEPT Provider Note   CSN: [id number] Arrival date & time: 09/04/20  1825     History Chief Complaint  Patient presents with  . Diarrhea    Kimberly Ramirez is a 87 y.o. female with a past medical history of Alzheimer's, TIA, who presents today for evaluation of a period of altered mental status. History is primarily obtained from med tech at patient's care facility.  Patient reportedly walked down for dinner okay, and then while at dinner was noted to be slumped, resting her head on her chest.  She was reportedly able to move her extremities and was able to speak however her words did sound slightly slurred.  They report she may have had some right-sided facial droop and was slow to find her words.  She was able to answer yes/no questions and was drooling. This lasted until EMS arrived.  With EMS her blood sugar was normal. They state that patient has been well as far as they are aware. Patient did tell EMS that she had some diarrhea.  Level 5 caveat for dementia.  HPI     Past Medical History:  Diagnosis Date  . Alzheimer disease ([place])   . Closed right hip fracture ([place]) 05/2017  . Hypertension   . TIA (transient ischemic attack) 2019    Patient Active Problem List   Diagnosis Date Noted  . Closed fracture of right proximal humerus 07/20/2019  . Closed right hip fracture, initial encounter ([place]) 05/30/2017  . Leukocytosis 05/30/2017  . Hip fracture ([place]) 05/30/2017  . Dysuria 05/30/2017  . TIA (transient ischemic attack) 05/16/2016    Past Surgical History:  Procedure Laterality Date  . ABDOMINAL HYSTERECTOMY    . FEMUR IM NAIL Right 05/31/2017  . FRACTURE SURGERY     left leg  . INTRAMEDULLARY (IM) NAIL INTERTROCHANTERIC Right 05/31/2017   Procedure: INTRAMEDULLARY (IM) NAIL INTERTROCHANTRIC;  Surgeon: Hiram Gash, MD;  Location: [facility];  Service: Orthopedics;  Laterality: Right;  . TOTAL SHOULDER ARTHROPLASTY Right 07/20/2019    Procedure: TOTAL SHOULDER ARTHROPLASTY;  Surgeon: Hiram Gash, MD;  Location: [facility];  Service: Orthopedics;  Laterality: Right;     OB History   No obstetric history on file.     Family History  Problem Relation Age of Onset  . Parkinson's disease Mother   . Heart Problems Father     Social History   Tobacco Use  . Smoking status: Never Smoker  . Smokeless tobacco: Never Used  Vaping Use  . Vaping Use: Never used  Substance Use Topics  . Alcohol use: No  . Drug use: No    Home Medications Prior to Admission medications   Medication Sig Start Date End Date Taking? Authorizing Provider  cephALEXin (KEFLEX) 500 MG capsule Take 1 capsule (500 mg total) by mouth 4 (four) times daily for 7 days. 09/05/20 09/12/20 Yes Lorin Glass, PA-C  acetaminophen (TYLENOL) 500 MG tablet Take 500-1,000 mg by mouth 3 (three) times daily as needed (1 tablet for mild/moderate pain, and 2 tablets for severe pain).     [provider]  Emollient Jilda Panda) LOTN Apply 1 application topically 3 (three) times daily as needed (dry skin or itching).    [provider]  fluconazole (DIFLUCAN) 100 MG tablet Take 100 mg by mouth 3 (three) times daily. 01/25/20   [provider]  guaifenesin (ROBITUSSIN) 100 MG/5ML syrup Take 200 mg by mouth every 4 (four) hours as needed for cough.  [provider]  ketoconazole (NIZORAL) 2 % cream Apply 1 application topically daily. 12/20/19   [provider]  [facility] powder Apply topically. 02/11/20   [provider]  ondansetron (ZOFRAN ODT) 8 MG disintegrating tablet Take 1 tablet (8 mg total) by mouth every 8 (eight) hours as needed for nausea. 10/25/19   Varney Biles, MD  polyethylene glycol (MIRALAX / GLYCOLAX) 17 g packet Take 17 g by mouth daily.    [provider]  Polyvinyl Alcohol-Povidone (CLEAR EYES NATURAL TEARS) 5-6 MG/ML SOLN Place 1 drop into both eyes at bedtime.    [provider]  tiZANidine (ZANAFLEX) 2 MG tablet Take 2 mg by mouth 2 (two) times daily. 11/01/19   [provider]  Zinc Oxide (DESITIN RAPID RELIEF) 13 % CREA Apply 1 application topically See admin instructions. To buttocks three times daily with shift change, AND as needed for rash or redness    [provider]    Allergies    Atorvastatin and Septra [sulfamethoxazole-trimethoprim]  Review of Systems   Review of Systems  Unable to perform ROS: Dementia    Physical Exam Updated Vital Signs BP (!) 181/73   Pulse 84   Temp 97.9 F (36.6 C) (Oral)   Resp 14   SpO2 100%   Physical Exam Vitals and nursing note reviewed.  Constitutional:      General: She is not in acute distress.    Appearance: She is not diaphoretic.  HENT:     Head: Normocephalic and atraumatic.  Eyes:     General: No scleral icterus.       Right eye: No discharge.        Left eye: No discharge.     Conjunctiva/sclera: Conjunctivae normal.  Cardiovascular:     Rate and Rhythm: Normal rate and regular rhythm.     Pulses: Normal pulses.  Pulmonary:     Effort: Pulmonary effort is normal. No respiratory distress.     Breath sounds: Normal breath sounds. No stridor.  Abdominal:     General: There is no distension.     Tenderness: There is abdominal tenderness (Epigastric, right upper quadrant).  Musculoskeletal:        General: No deformity or signs of injury.     Cervical back: Normal range of motion and neck supple.     Right lower leg: No edema.     Left lower leg: No edema.  Skin:    General: Skin is warm and dry.  Neurological:     Mental Status: She is alert. Mental status is at baseline.     Motor: No abnormal muscle tone.     Comments: Patient is awake and alert, she is oriented to person, not to place or time.  She does not know what year it is or who the president is.  No facial droop.  Speech is not slurred.  She moves all 4 extremities spontaneously with 5/5 strength.   Psychiatric:        Mood and Affect: Mood normal.        Behavior: Behavior normal.     ED Results / Procedures / Treatments   Labs (all labs ordered are listed, but only abnormal results are displayed) Labs Reviewed  COMPREHENSIVE METABOLIC PANEL - Abnormal; Notable for the following components:      Result Value   Glucose, Bld 111 (*)    BUN 33 (*)    Creatinine, Ser 1.18 (*)    Total Protein  8.7 (*)    AST 50 (*)    GFR, Estimated 44 (*)    All other components within normal limits  CBC WITH DIFFERENTIAL/PLATELET - Abnormal; Notable for the following components:   Neutro Abs 7.9 (*)    All other components within normal limits  URINALYSIS, ROUTINE W REFLEX MICROSCOPIC - Abnormal; Notable for the following components:   APPearance HAZY (*)    Nitrite POSITIVE (*)    Leukocytes,Ua LARGE (*)    Bacteria, UA MANY (*)    All other components within normal limits  LIPASE, BLOOD - Abnormal; Notable for the following components:   Lipase 65 (*)    All other components within normal limits  URINE CULTURE  PROTIME-INR  TROPONIN I (HIGH SENSITIVITY)  TROPONIN I (HIGH SENSITIVITY)    EKG None  Radiology CT Abdomen Pelvis Wo Contrast  Result Date: 09/04/2020 CLINICAL DATA:  87 year old female with left upper quadrant abdominal pain and diarrhea. EXAM: CT ABDOMEN AND PELVIS WITHOUT CONTRAST TECHNIQUE: Multidetector CT imaging of the abdomen and pelvis was performed following the standard protocol without IV contrast. COMPARISON:  None. FINDINGS: Evaluation of this exam is limited in the absence of intravenous contrast. Lower chest: The visualized lung bases are clear. There is coronary vascular calcification. No intra-abdominal free air or free fluid. Hepatobiliary: The liver is unremarkable. No intrahepatic biliary dilatation. The gallbladder is unremarkable. Pancreas: Unremarkable. No pancreatic ductal dilatation or surrounding inflammatory changes. Spleen: Normal in size without  focal abnormality. Adrenals/Urinary Tract: The adrenal glands unremarkable there is no hydronephrosis or nephrolithiasis on either side. Indeterminate subcentimeter exophytic lesion from the inferior pole of the left kidney. Ultrasound may provide better evaluation on a nonemergent/outpatient basis. The visualized ureters and urinary bladder appear unremarkable. Stomach/Bowel: There is sigmoid diverticulosis without active inflammatory changes. There is no bowel obstruction or active inflammation. The appendix is normal. Vascular/Lymphatic: Advanced aortoiliac atherosclerotic disease. The IVC is unremarkable. No portal venous gas. There is no adenopathy. Reproductive: Hysterectomy. No adnexal masses. Other: None Musculoskeletal: Osteopenia with degenerative changes of the spine. Status post prior ORIF of the right femur. No acute osseous pathology. IMPRESSION: 1. No acute intra-abdominal or pelvic pathology. 2. Sigmoid diverticulosis. No bowel obstruction. Normal appendix. 3. Aortic Atherosclerosis (ICD10-I70.0). Electronically Signed   By: Anner Crete M.D.   On: 09/04/2020 21:28   CT Head Wo Contrast  Result Date: 09/04/2020 CLINICAL DATA:  Transient ischemic attack (TIA) Dementia ams EXAM: CT HEAD WITHOUT CONTRAST TECHNIQUE: Contiguous axial images were obtained from the base of the skull through the vertex without intravenous contrast. COMPARISON:  Head CT 05/30/2017 FINDINGS: Brain: No intracranial hemorrhage, mass effect, or midline shift. Slight progression of atrophy and advanced chronic small vessel ischemia from 2019. Remote left thalamic lacunar infarct. No hydrocephalus. The basilar cisterns are patent. No evidence of territorial infarct or acute ischemia. No extra-axial or intracranial fluid collection. Vascular: Atherosclerosis of skullbase vasculature without hyperdense vessel or abnormal calcification. Skull: No fracture or focal lesion. Sinuses/Orbits: Chronic left maxillary sinusitis with  cortical thickening and high density material. Chronic opacification of left ethmoid air cells. No acute findings. Bilateral lens extraction. Other: None. IMPRESSION: 1. No acute intracranial abnormality. 2. Slight progression of atrophy and advanced chronic small vessel ischemia from 2019. Remote left thalamic lacunar infarct. 3. Chronic left maxillary sinusitis. Electronically Signed   By: Keith Rake M.D.   On: 09/04/2020 21:25    Procedures Procedures   Medications Ordered in ED Medications  cephALEXin (KEFLEX) capsule 500 mg (  has no administration in time range)  sodium chloride 0.9 % bolus 500 mL (0 mLs Intravenous Stopped 09/05/20 0022)    ED Course  I have reviewed the triage vital signs and the nursing notes.  Pertinent labs & imaging results that were available during my care of the patient were reviewed by me and considered in my medical decision making (see chart for details).  Clinical Course as of 09/05/20 0044  Tue Sep 04, 2020  [facility] Reports that that she was slumping over Had a hard time gettting words out A little slur at the end and mumbling Lasted 445 went to dining and at like 530 started slurring right side of face looked a little droopy on the right side and was drooling  [EH]  Wed Sep 05, 2020  0039 I spoke with Patients daughter who is HCPOA per her report.  Updated on UA, plan of care.  [EH]    Clinical Course User Index [EH] Ollen Gross   MDM Rules/Calculators/A&P                         Patient is a 87 year old woman who presents today for evaluation of episode of decreased level of consciousness. Patient is demented limiting ability to provide history. On my exam patient appears to be back to her neurologic baseline per report of baseline given to me by facility staff.  Given that she does report diarrhea and has abdominal tenderness labs and CT scan are obtained and reviewed. CBC does not show leukocytosis or anemia.  CMP  shows creatinine is not significantly elevated compared to baseline.  Alk phos is normal.  Her lipase is slightly elevated at 65. CT abdomen pelvis is obtained without contrast due to national IV contrast shortage.  This does not show any evidence of acute abnormalities.  Does show diverticulosis without diverticulitis.  CT head is obtained given her dementia, it is unclear if she possibly had a fall or any kind of head trauma before this event.  CT head does not show any acute abnormalities. Troponin is not elevated.  I ordered IV fluids for dehydration.  UA is obtained showing nitrite positive, with microscopy showing 0-5 reds, 21-50 whites with many bacteria and only 0-5 squamous epithelial cells.  Urine culture is sent.  Given her symptoms we will treat.  I spoke with patient's daughter who reportedly is her healthcare power of attorney and the daughter's husband who is a retired Psychologist, sport and exercise. We discussed options for admission for continued evaluation including possibly MRI and EEG, versus outpatient neurology follow-up.  Given patient's overall condition and status they elected for outpatient follow-up after we discussed risks, benefits, and alternatives.    We discussed that patient could have a stroke or other significant event, and they are aware of this, and feel like the goal is to have patient home in her own bed tonight rather than pursuing aggressive further evaluation tonight.  I feel that this is very reasonable, they appear knowledgeable about the current situation.  Patient's daughter came while patient was in the ER and feels like patient is back to her normal baseline.  Patient was able to tolerate p.o. intake and was given IV fluids.  Patient will be discharged back to facility.   Antibiotics sent to what daughter reports is preferred pharmacy.  Return precautions were discussed with patient/family who states their understanding.  At the time of discharge patient/family denied  any unaddressed complaints or  concerns.  Patient/family is agreeable for discharge home.  Note: Portions of this report may have been transcribed using voice recognition software. Every effort was made to ensure accuracy; however, inadvertent computerized transcription errors may be present   Final Clinical Impression(s) / ED Diagnoses Final diagnoses:  Altered level of consciousness  TIA (transient ischemic attack)  Acute cystitis without hematuria    Rx / DC Orders ED Discharge Orders         Ordered    cephALEXin (KEFLEX) 500 MG capsule  4 times daily        09/05/20 0041           Lorin Glass, PA-C 09/05/20 7782    Milton Ferguson, MD 09/05/20 1500

## 2020-09-04 NOTE — ED Triage Notes (Addendum)
EMS reports from [facility] assisted [facility], facility called for diarrhea since yesterday. Denies N/V or ab pain. Hx of Alzheimers's, poor historian. BP 129/64 HR 70 RR 14 Spo2 97 RA CBG 151 Temp 97.7

## 2020-09-05 DIAGNOSIS — R4182 Altered mental status, unspecified: Secondary | ICD-10-CM | POA: Diagnosis not present

## 2020-09-05 DIAGNOSIS — I10 Essential (primary) hypertension: Secondary | ICD-10-CM | POA: Diagnosis not present

## 2020-09-05 LAB — URINALYSIS, ROUTINE W REFLEX MICROSCOPIC
Bilirubin Urine: NEGATIVE
Glucose, UA: NEGATIVE mg/dL
Hgb urine dipstick: NEGATIVE
Ketones, ur: NEGATIVE mg/dL
Nitrite: POSITIVE — AB
Protein, ur: NEGATIVE mg/dL
Specific Gravity, Urine: 1.013 (ref 1.005–1.030)
pH: 7 (ref 5.0–8.0)

## 2020-09-05 MED ORDER — CEPHALEXIN 500 MG PO CAPS: 500.0000 mg | ORAL_CAPSULE | Freq: Four times a day (QID) | ORAL | 0 refills | Status: AC

## 2020-09-05 MED ORDER — CEPHALEXIN 500 MG PO CAPS
500.0000 mg | ORAL_CAPSULE | Freq: Once | ORAL | Status: AC
  Administered 2020-09-05: 500 mg via ORAL
  Filled 2020-09-05: qty 1

## 2020-09-05 NOTE — Discharge Instructions (Signed)
As we discussed today I suspect that you may have had a TIA. We discussed options for admission versus outpatient follow-up. I would recommend that you schedule a follow-up with a neurologist if this is something that you wish for further evaluation on.  Lipase was slightly elevated today at 65 with some mild abdominal pain, but the CT scan was reassuring.  I would recommend if any symptoms persist getting this rechecked by primary care doctor.  Urine is concerning for infection.

## 2020-09-05 NOTE — ED Notes (Signed)
[facility] transport requested.  

## 2020-09-06 DIAGNOSIS — Z1159 Encounter for screening for other viral diseases: Secondary | ICD-10-CM | POA: Diagnosis not present

## 2020-09-06 DIAGNOSIS — Z20828 Contact with and (suspected) exposure to other viral communicable diseases: Secondary | ICD-10-CM | POA: Diagnosis not present

## 2020-09-07 LAB — URINE CULTURE: Culture: >=100000 — AB

## 2020-09-08 ENCOUNTER — Telehealth: Payer: Self-pay | Admitting: Emergency Medicine

## 2020-09-08 NOTE — Telephone Encounter (Signed)
Post ED Visit - Positive Culture Follow-up  Culture report reviewed by antimicrobial stewardship pharmacist: [place] Team []  Elenor Quinones, Pharm.D. []  Heide Guile, Pharm.D., BCPS AQ-ID []  Parks Neptune, Pharm.D., BCPS []  Alycia Rossetti, Pharm.D., BCPS []  [place], Pharm.D., BCPS, AAHIVP []  Legrand Como, Pharm.D., BCPS, AAHIVP []  Salome Arnt, PharmD, BCPS []  Johnnette Gourd, PharmD, BCPS []  Hughes Better, PharmD, BCPS []  Leeroy Cha, PharmD []  Laqueta Linden, PharmD, BCPS []  Albertina Parr, PharmD  [place] Team []  Leodis Sias, PharmD []  Lindell Spar, PharmD []  Royetta Asal, PharmD []  Graylin Shiver, Rph []  Rema Fendt) Glennon Mac, PharmD []  Arlyn Dunning, PharmD []  Netta Cedars, PharmD []  Dia Sitter, PharmD []  Leone Haven, PharmD []  Gretta Arab, PharmD []  Theodis Shove, PharmD []  Peggyann Juba, PharmD [x]  Reuel Boom, PharmD   Positive urine culture Treated with Cephalexin, organism sensitive to the same and no further patient follow-up is required at this time.  Kimberly Ramirez 09/08/2020, 2:24 PM

## 2020-09-10 DIAGNOSIS — Z20828 Contact with and (suspected) exposure to other viral communicable diseases: Secondary | ICD-10-CM | POA: Diagnosis not present

## 2020-09-10 DIAGNOSIS — Z1159 Encounter for screening for other viral diseases: Secondary | ICD-10-CM | POA: Diagnosis not present

## 2020-09-13 DIAGNOSIS — Z1159 Encounter for screening for other viral diseases: Secondary | ICD-10-CM | POA: Diagnosis not present

## 2020-09-13 DIAGNOSIS — Z20828 Contact with and (suspected) exposure to other viral communicable diseases: Secondary | ICD-10-CM | POA: Diagnosis not present

## 2020-09-17 DIAGNOSIS — Z1159 Encounter for screening for other viral diseases: Secondary | ICD-10-CM | POA: Diagnosis not present

## 2020-09-17 DIAGNOSIS — Z20828 Contact with and (suspected) exposure to other viral communicable diseases: Secondary | ICD-10-CM | POA: Diagnosis not present

## 2020-09-20 DIAGNOSIS — Z1159 Encounter for screening for other viral diseases: Secondary | ICD-10-CM | POA: Diagnosis not present

## 2020-09-20 DIAGNOSIS — Z20828 Contact with and (suspected) exposure to other viral communicable diseases: Secondary | ICD-10-CM | POA: Diagnosis not present

## 2020-09-24 DIAGNOSIS — Z20828 Contact with and (suspected) exposure to other viral communicable diseases: Secondary | ICD-10-CM | POA: Diagnosis not present

## 2020-09-24 DIAGNOSIS — Z1159 Encounter for screening for other viral diseases: Secondary | ICD-10-CM | POA: Diagnosis not present

## 2020-09-27 DIAGNOSIS — Z20828 Contact with and (suspected) exposure to other viral communicable diseases: Secondary | ICD-10-CM | POA: Diagnosis not present

## 2020-09-27 DIAGNOSIS — Z1159 Encounter for screening for other viral diseases: Secondary | ICD-10-CM | POA: Diagnosis not present

## 2020-10-01 DIAGNOSIS — Z1159 Encounter for screening for other viral diseases: Secondary | ICD-10-CM | POA: Diagnosis not present

## 2020-10-01 DIAGNOSIS — Z20828 Contact with and (suspected) exposure to other viral communicable diseases: Secondary | ICD-10-CM | POA: Diagnosis not present

## 2020-10-04 DIAGNOSIS — Z1159 Encounter for screening for other viral diseases: Secondary | ICD-10-CM | POA: Diagnosis not present

## 2020-10-04 DIAGNOSIS — Z20828 Contact with and (suspected) exposure to other viral communicable diseases: Secondary | ICD-10-CM | POA: Diagnosis not present

## 2020-10-08 DIAGNOSIS — Z20828 Contact with and (suspected) exposure to other viral communicable diseases: Secondary | ICD-10-CM | POA: Diagnosis not present

## 2020-10-08 DIAGNOSIS — Z1159 Encounter for screening for other viral diseases: Secondary | ICD-10-CM | POA: Diagnosis not present

## 2020-10-11 DIAGNOSIS — Z1159 Encounter for screening for other viral diseases: Secondary | ICD-10-CM | POA: Diagnosis not present

## 2020-10-11 DIAGNOSIS — Z20828 Contact with and (suspected) exposure to other viral communicable diseases: Secondary | ICD-10-CM | POA: Diagnosis not present

## 2020-10-15 DIAGNOSIS — Z1159 Encounter for screening for other viral diseases: Secondary | ICD-10-CM | POA: Diagnosis not present

## 2020-10-15 DIAGNOSIS — Z20828 Contact with and (suspected) exposure to other viral communicable diseases: Secondary | ICD-10-CM | POA: Diagnosis not present

## 2020-10-18 DIAGNOSIS — Z20828 Contact with and (suspected) exposure to other viral communicable diseases: Secondary | ICD-10-CM | POA: Diagnosis not present

## 2020-10-18 DIAGNOSIS — Z1159 Encounter for screening for other viral diseases: Secondary | ICD-10-CM | POA: Diagnosis not present

## 2020-10-22 DIAGNOSIS — Z1159 Encounter for screening for other viral diseases: Secondary | ICD-10-CM | POA: Diagnosis not present

## 2020-10-22 DIAGNOSIS — Z20828 Contact with and (suspected) exposure to other viral communicable diseases: Secondary | ICD-10-CM | POA: Diagnosis not present

## 2020-10-29 DIAGNOSIS — Z20828 Contact with and (suspected) exposure to other viral communicable diseases: Secondary | ICD-10-CM | POA: Diagnosis not present

## 2020-10-29 DIAGNOSIS — Z1159 Encounter for screening for other viral diseases: Secondary | ICD-10-CM | POA: Diagnosis not present

## 2020-11-01 DIAGNOSIS — Z20828 Contact with and (suspected) exposure to other viral communicable diseases: Secondary | ICD-10-CM | POA: Diagnosis not present

## 2020-11-01 DIAGNOSIS — Z1159 Encounter for screening for other viral diseases: Secondary | ICD-10-CM | POA: Diagnosis not present

## 2020-11-05 DIAGNOSIS — Z1159 Encounter for screening for other viral diseases: Secondary | ICD-10-CM | POA: Diagnosis not present

## 2020-11-05 DIAGNOSIS — Z20828 Contact with and (suspected) exposure to other viral communicable diseases: Secondary | ICD-10-CM | POA: Diagnosis not present

## 2020-11-08 DIAGNOSIS — Z1159 Encounter for screening for other viral diseases: Secondary | ICD-10-CM | POA: Diagnosis not present

## 2020-11-08 DIAGNOSIS — Z20828 Contact with and (suspected) exposure to other viral communicable diseases: Secondary | ICD-10-CM | POA: Diagnosis not present

## 2020-11-12 DIAGNOSIS — Z20828 Contact with and (suspected) exposure to other viral communicable diseases: Secondary | ICD-10-CM | POA: Diagnosis not present

## 2020-11-12 DIAGNOSIS — Z1159 Encounter for screening for other viral diseases: Secondary | ICD-10-CM | POA: Diagnosis not present

## 2020-11-15 DIAGNOSIS — Z1159 Encounter for screening for other viral diseases: Secondary | ICD-10-CM | POA: Diagnosis not present

## 2020-11-15 DIAGNOSIS — Z20828 Contact with and (suspected) exposure to other viral communicable diseases: Secondary | ICD-10-CM | POA: Diagnosis not present

## 2020-11-28 DIAGNOSIS — Z20828 Contact with and (suspected) exposure to other viral communicable diseases: Secondary | ICD-10-CM | POA: Diagnosis not present

## 2020-12-05 DIAGNOSIS — Z8616 Personal history of COVID-19: Secondary | ICD-10-CM | POA: Diagnosis not present

## 2020-12-12 ENCOUNTER — Encounter: Payer: Self-pay | Admitting: Podiatry

## 2020-12-12 ENCOUNTER — Ambulatory Visit (INDEPENDENT_AMBULATORY_CARE_PROVIDER_SITE_OTHER): Payer: Medicare Other | Admitting: Podiatry

## 2020-12-12 ENCOUNTER — Other Ambulatory Visit: Payer: Self-pay

## 2020-12-12 DIAGNOSIS — L84 Corns and callosities: Secondary | ICD-10-CM

## 2020-12-12 DIAGNOSIS — M79676 Pain in unspecified toe(s): Secondary | ICD-10-CM | POA: Diagnosis not present

## 2020-12-12 DIAGNOSIS — I872 Venous insufficiency (chronic) (peripheral): Secondary | ICD-10-CM

## 2020-12-12 DIAGNOSIS — B351 Tinea unguium: Secondary | ICD-10-CM | POA: Diagnosis not present

## 2020-12-12 NOTE — Progress Notes (Signed)
This patient returns to the office for evaluation and treatment of long thick painful nails .  This patient is unable to trim her own nails since the patient cannot reach her  feet.  Patient says the nails are painful walking and wearing his shoes.  She returns for preventive foot care services.  She presents to the office with her daughter.  General Appearance  Alert, conversant and in no acute stress.  Vascular  Dorsalis pedis and posterior tibial  pulses are  weakly palpable  bilaterally.  Capillary return is within normal limits  bilaterally. Temperature is within normal limits  bilaterally.  Neurologic  Senn-Weinstein monofilament wire test within normal limits  bilaterally. Muscle power within normal limits bilaterally.  Nails Thick disfigured discolored nails with subungual debris  from hallux to fifth toes bilaterally. No evidence of bacterial infection or drainage bilaterally.  Orthopedic  No limitations of motion  feet .  No crepitus or effusions noted.  No bony pathology or digital deformities noted.  HAV 1st MPJ right foot.  Skin  normotropic skin with no porokeratosis noted bilaterally.  No signs of infections or ulcers noted.  Asymptomatic  HD 5th toe left foot.   Symptomatic callus left hallux.  Onychomycosis  Pain in toes right foot  Pain in toes left foot  Debridement  of nails  1-5  B/L with a nail nipper.  Nails were then filed using a dremel tool with no incidents.    RTC  3  months   Gardiner Barefoot DPM

## 2020-12-19 DIAGNOSIS — Z20828 Contact with and (suspected) exposure to other viral communicable diseases: Secondary | ICD-10-CM | POA: Diagnosis not present

## 2020-12-25 DIAGNOSIS — B351 Tinea unguium: Secondary | ICD-10-CM | POA: Diagnosis not present

## 2020-12-25 DIAGNOSIS — L84 Corns and callosities: Secondary | ICD-10-CM | POA: Diagnosis not present

## 2020-12-25 DIAGNOSIS — M2041 Other hammer toe(s) (acquired), right foot: Secondary | ICD-10-CM | POA: Diagnosis not present

## 2020-12-26 DIAGNOSIS — Z20828 Contact with and (suspected) exposure to other viral communicable diseases: Secondary | ICD-10-CM | POA: Diagnosis not present

## 2020-12-27 DIAGNOSIS — Z23 Encounter for immunization: Secondary | ICD-10-CM | POA: Diagnosis not present

## 2020-12-28 DIAGNOSIS — Z23 Encounter for immunization: Secondary | ICD-10-CM | POA: Diagnosis not present

## 2021-01-02 DIAGNOSIS — Z20828 Contact with and (suspected) exposure to other viral communicable diseases: Secondary | ICD-10-CM | POA: Diagnosis not present

## 2021-01-10 NOTE — Telephone Encounter (Signed)
error 

## 2021-01-16 DIAGNOSIS — Z8616 Personal history of COVID-19: Secondary | ICD-10-CM | POA: Diagnosis not present

## 2021-01-23 DIAGNOSIS — Z20828 Contact with and (suspected) exposure to other viral communicable diseases: Secondary | ICD-10-CM | POA: Diagnosis not present

## 2021-02-06 DIAGNOSIS — Z20828 Contact with and (suspected) exposure to other viral communicable diseases: Secondary | ICD-10-CM | POA: Diagnosis not present

## 2021-02-13 DIAGNOSIS — Z20822 Contact with and (suspected) exposure to covid-19: Secondary | ICD-10-CM | POA: Diagnosis not present

## 2021-02-20 DIAGNOSIS — Z20828 Contact with and (suspected) exposure to other viral communicable diseases: Secondary | ICD-10-CM | POA: Diagnosis not present

## 2021-03-04 DIAGNOSIS — G301 Alzheimer's disease with late onset: Secondary | ICD-10-CM | POA: Diagnosis not present

## 2021-03-04 DIAGNOSIS — E7800 Pure hypercholesterolemia, unspecified: Secondary | ICD-10-CM | POA: Diagnosis not present

## 2021-03-04 DIAGNOSIS — I10 Essential (primary) hypertension: Secondary | ICD-10-CM | POA: Diagnosis not present

## 2021-03-04 DIAGNOSIS — F0280 Dementia in other diseases classified elsewhere without behavioral disturbance: Secondary | ICD-10-CM | POA: Diagnosis not present

## 2021-03-04 DIAGNOSIS — Z1389 Encounter for screening for other disorder: Secondary | ICD-10-CM | POA: Diagnosis not present

## 2021-03-04 DIAGNOSIS — Z0000 Encounter for general adult medical examination without abnormal findings: Secondary | ICD-10-CM | POA: Diagnosis not present

## 2021-03-04 DIAGNOSIS — Z79899 Other long term (current) drug therapy: Secondary | ICD-10-CM | POA: Diagnosis not present

## 2021-03-06 DIAGNOSIS — Z20828 Contact with and (suspected) exposure to other viral communicable diseases: Secondary | ICD-10-CM | POA: Diagnosis not present

## 2021-03-11 DIAGNOSIS — Z20828 Contact with and (suspected) exposure to other viral communicable diseases: Secondary | ICD-10-CM | POA: Diagnosis not present

## 2021-03-11 DIAGNOSIS — Z1159 Encounter for screening for other viral diseases: Secondary | ICD-10-CM | POA: Diagnosis not present

## 2021-03-15 ENCOUNTER — Encounter: Payer: Self-pay | Admitting: Podiatry

## 2021-03-15 ENCOUNTER — Other Ambulatory Visit: Payer: Self-pay

## 2021-03-15 ENCOUNTER — Ambulatory Visit (INDEPENDENT_AMBULATORY_CARE_PROVIDER_SITE_OTHER): Payer: Medicare Other | Admitting: Podiatry

## 2021-03-15 DIAGNOSIS — I872 Venous insufficiency (chronic) (peripheral): Secondary | ICD-10-CM | POA: Diagnosis not present

## 2021-03-15 DIAGNOSIS — M79676 Pain in unspecified toe(s): Secondary | ICD-10-CM | POA: Diagnosis not present

## 2021-03-15 DIAGNOSIS — B351 Tinea unguium: Secondary | ICD-10-CM

## 2021-03-15 DIAGNOSIS — L84 Corns and callosities: Secondary | ICD-10-CM

## 2021-03-15 NOTE — Progress Notes (Signed)
This patient returns to the office for evaluation and treatment of long thick painful nails .  This patient is unable to trim her own nails since the patient cannot reach her  feet.  Patient says the nails are painful walking and wearing his shoes.  She returns for preventive foot care services.  She presents to the office with her daughter.  General Appearance  Alert, conversant and in no acute stress.  Vascular  Dorsalis pedis and posterior tibial  pulses are  weakly palpable  bilaterally.  Capillary return is within normal limits  bilaterally. Temperature is within normal limits  bilaterally.  Neurologic  Senn-Weinstein monofilament wire test within normal limits  bilaterally. Muscle power within normal limits bilaterally.  Nails Thick disfigured discolored nails with subungual debris  from hallux to fifth toes bilaterally. No evidence of bacterial infection or drainage bilaterally.  Orthopedic  No limitations of motion  feet .  No crepitus or effusions noted.  No bony pathology or digital deformities noted.  HAV 1st MPJ right foot.  Skin  normotropic skin with no porokeratosis noted bilaterally.  No signs of infections or ulcers noted.  Symptomatic  HD 5th toe left foot.   Asymptomatic callus left hallux.  Onychomycosis  Pain in toes right foot  Pain in toes left foot  Corn fifth toe left foot.  Debridement  of nails  1-5  B/L with a nail nipper.  Nails were then filed using a dremel tool with no incidents. Debride corn with # 15 blade and dremel tool.   RTC  3  months   Gardiner Barefoot DPM

## 2021-03-18 DIAGNOSIS — L84 Corns and callosities: Secondary | ICD-10-CM | POA: Diagnosis not present

## 2021-03-18 DIAGNOSIS — Z20828 Contact with and (suspected) exposure to other viral communicable diseases: Secondary | ICD-10-CM | POA: Diagnosis not present

## 2021-03-18 DIAGNOSIS — M2041 Other hammer toe(s) (acquired), right foot: Secondary | ICD-10-CM | POA: Diagnosis not present

## 2021-03-18 DIAGNOSIS — B351 Tinea unguium: Secondary | ICD-10-CM | POA: Diagnosis not present

## 2021-03-18 DIAGNOSIS — Z1159 Encounter for screening for other viral diseases: Secondary | ICD-10-CM | POA: Diagnosis not present

## 2021-03-27 DIAGNOSIS — Z1159 Encounter for screening for other viral diseases: Secondary | ICD-10-CM | POA: Diagnosis not present

## 2021-03-27 DIAGNOSIS — Z20828 Contact with and (suspected) exposure to other viral communicable diseases: Secondary | ICD-10-CM | POA: Diagnosis not present

## 2021-04-01 DIAGNOSIS — Z1159 Encounter for screening for other viral diseases: Secondary | ICD-10-CM | POA: Diagnosis not present

## 2021-04-01 DIAGNOSIS — Z20828 Contact with and (suspected) exposure to other viral communicable diseases: Secondary | ICD-10-CM | POA: Diagnosis not present

## 2021-04-03 DIAGNOSIS — Z20828 Contact with and (suspected) exposure to other viral communicable diseases: Secondary | ICD-10-CM | POA: Diagnosis not present

## 2021-04-03 DIAGNOSIS — Z1159 Encounter for screening for other viral diseases: Secondary | ICD-10-CM | POA: Diagnosis not present

## 2021-04-08 DIAGNOSIS — Z20828 Contact with and (suspected) exposure to other viral communicable diseases: Secondary | ICD-10-CM | POA: Diagnosis not present

## 2021-04-08 DIAGNOSIS — Z1159 Encounter for screening for other viral diseases: Secondary | ICD-10-CM | POA: Diagnosis not present

## 2021-04-10 DIAGNOSIS — Z1159 Encounter for screening for other viral diseases: Secondary | ICD-10-CM | POA: Diagnosis not present

## 2021-04-10 DIAGNOSIS — Z20828 Contact with and (suspected) exposure to other viral communicable diseases: Secondary | ICD-10-CM | POA: Diagnosis not present

## 2021-04-15 DIAGNOSIS — Z20822 Contact with and (suspected) exposure to covid-19: Secondary | ICD-10-CM | POA: Diagnosis not present

## 2021-04-17 DIAGNOSIS — Z1159 Encounter for screening for other viral diseases: Secondary | ICD-10-CM | POA: Diagnosis not present

## 2021-04-17 DIAGNOSIS — Z20828 Contact with and (suspected) exposure to other viral communicable diseases: Secondary | ICD-10-CM | POA: Diagnosis not present

## 2021-04-22 DIAGNOSIS — Z20822 Contact with and (suspected) exposure to covid-19: Secondary | ICD-10-CM | POA: Diagnosis not present

## 2021-04-24 DIAGNOSIS — Z20828 Contact with and (suspected) exposure to other viral communicable diseases: Secondary | ICD-10-CM | POA: Diagnosis not present

## 2021-04-24 DIAGNOSIS — Z1159 Encounter for screening for other viral diseases: Secondary | ICD-10-CM | POA: Diagnosis not present

## 2021-04-29 DIAGNOSIS — Z20822 Contact with and (suspected) exposure to covid-19: Secondary | ICD-10-CM | POA: Diagnosis not present

## 2021-05-06 DIAGNOSIS — Z20822 Contact with and (suspected) exposure to covid-19: Secondary | ICD-10-CM | POA: Diagnosis not present

## 2021-05-13 DIAGNOSIS — Z20822 Contact with and (suspected) exposure to covid-19: Secondary | ICD-10-CM | POA: Diagnosis not present

## 2021-05-17 DIAGNOSIS — Z20828 Contact with and (suspected) exposure to other viral communicable diseases: Secondary | ICD-10-CM | POA: Diagnosis not present

## 2021-06-14 ENCOUNTER — Ambulatory Visit (INDEPENDENT_AMBULATORY_CARE_PROVIDER_SITE_OTHER): Payer: Medicare Other | Admitting: Podiatry

## 2021-06-14 ENCOUNTER — Other Ambulatory Visit: Payer: Self-pay

## 2021-06-14 ENCOUNTER — Encounter: Payer: Self-pay | Admitting: Podiatry

## 2021-06-14 DIAGNOSIS — I872 Venous insufficiency (chronic) (peripheral): Secondary | ICD-10-CM

## 2021-06-14 DIAGNOSIS — M79676 Pain in unspecified toe(s): Secondary | ICD-10-CM

## 2021-06-14 DIAGNOSIS — B351 Tinea unguium: Secondary | ICD-10-CM

## 2021-06-14 NOTE — Progress Notes (Signed)
This patient returns to the office for evaluation and treatment of long thick painful nails .  This patient is unable to trim her own nails since the patient cannot reach her  feet.  Patient says the nails are painful walking and wearing his shoes.  She returns for preventive foot care services.  She presents to the office with her daughter. ? ?General Appearance  Alert, conversant and in no acute stress. ? ?Vascular  Dorsalis pedis and posterior tibial  pulses are  weakly palpable  bilaterally.  Capillary return is within normal limits  bilaterally. Temperature is within normal limits  bilaterally. ? ?Neurologic  Senn-Weinstein monofilament wire test within normal limits  bilaterally. Muscle power within normal limits bilaterally. ? ?Nails Thick disfigured discolored nails with subungual debris  from hallux to fifth toes bilaterally. No evidence of bacterial infection or drainage bilaterally. ? ?Orthopedic  No limitations of motion  feet .  No crepitus or effusions noted.  No bony pathology or digital deformities noted.  HAV 1st MPJ right foot. ? ?Skin  normotropic skin with no porokeratosis noted bilaterally.  No signs of infections or ulcers noted.  Asymptomatic  HD 5th toe left foot.   Asymptomatic callus left hallux. ? ?Onychomycosis  Pain in toes right foot  Pain in toes left foot  Corn fifth toe left foot. ? ?Debridement  of nails  1-5  B/L with a nail nipper.  Nails were then filed using a dremel tool with no incidents.   RTC  3  months ? ? ?Gardiner Barefoot DPM  ?

## 2021-06-25 ENCOUNTER — Emergency Department (HOSPITAL_COMMUNITY): Payer: Medicare Other

## 2021-06-25 ENCOUNTER — Emergency Department (HOSPITAL_COMMUNITY)
Admission: EM | Admit: 2021-06-25 | Discharge: 2021-06-25 | Disposition: A | Discharge status: Skilled Nursing Facility | Payer: Medicare Other | Source: Home / Self Care | Attending: Emergency Medicine | Admitting: Emergency Medicine

## 2021-06-25 ENCOUNTER — Encounter (HOSPITAL_COMMUNITY): Payer: Self-pay | Admitting: Emergency Medicine

## 2021-06-25 ENCOUNTER — Other Ambulatory Visit: Payer: Self-pay

## 2021-06-25 DIAGNOSIS — S0990XA Unspecified injury of head, initial encounter: Secondary | ICD-10-CM | POA: Diagnosis not present

## 2021-06-25 DIAGNOSIS — M5021 Other cervical disc displacement,  high cervical region: Secondary | ICD-10-CM | POA: Diagnosis not present

## 2021-06-25 DIAGNOSIS — Z8616 Personal history of COVID-19: Secondary | ICD-10-CM | POA: Diagnosis not present

## 2021-06-25 DIAGNOSIS — J439 Emphysema, unspecified: Secondary | ICD-10-CM | POA: Diagnosis not present

## 2021-06-25 DIAGNOSIS — Y92129 Unspecified place in nursing home as the place of occurrence of the external cause: Secondary | ICD-10-CM | POA: Diagnosis not present

## 2021-06-25 DIAGNOSIS — D649 Anemia, unspecified: Secondary | ICD-10-CM | POA: Diagnosis not present

## 2021-06-25 DIAGNOSIS — R079 Chest pain, unspecified: Secondary | ICD-10-CM | POA: Diagnosis not present

## 2021-06-25 DIAGNOSIS — Z8673 Personal history of transient ischemic attack (TIA), and cerebral infarction without residual deficits: Secondary | ICD-10-CM | POA: Diagnosis not present

## 2021-06-25 DIAGNOSIS — R4182 Altered mental status, unspecified: Secondary | ICD-10-CM | POA: Diagnosis not present

## 2021-06-25 DIAGNOSIS — S32592A Other specified fracture of left pubis, initial encounter for closed fracture: Secondary | ICD-10-CM | POA: Diagnosis not present

## 2021-06-25 DIAGNOSIS — M47812 Spondylosis without myelopathy or radiculopathy, cervical region: Secondary | ICD-10-CM | POA: Diagnosis not present

## 2021-06-25 DIAGNOSIS — M160 Bilateral primary osteoarthritis of hip: Secondary | ICD-10-CM | POA: Diagnosis not present

## 2021-06-25 DIAGNOSIS — J320 Chronic maxillary sinusitis: Secondary | ICD-10-CM | POA: Diagnosis not present

## 2021-06-25 DIAGNOSIS — S0003XA Contusion of scalp, initial encounter: Secondary | ICD-10-CM | POA: Diagnosis not present

## 2021-06-25 DIAGNOSIS — Z882 Allergy status to sulfonamides status: Secondary | ICD-10-CM | POA: Diagnosis not present

## 2021-06-25 DIAGNOSIS — Z820 Family history of epilepsy and other diseases of the nervous system: Secondary | ICD-10-CM | POA: Diagnosis not present

## 2021-06-25 DIAGNOSIS — N760 Acute vaginitis: Secondary | ICD-10-CM | POA: Insufficient documentation

## 2021-06-25 DIAGNOSIS — S20411A Abrasion of right back wall of thorax, initial encounter: Secondary | ICD-10-CM | POA: Diagnosis not present

## 2021-06-25 DIAGNOSIS — I959 Hypotension, unspecified: Secondary | ICD-10-CM | POA: Diagnosis not present

## 2021-06-25 DIAGNOSIS — Z66 Do not resuscitate: Secondary | ICD-10-CM | POA: Diagnosis not present

## 2021-06-25 DIAGNOSIS — N390 Urinary tract infection, site not specified: Secondary | ICD-10-CM

## 2021-06-25 DIAGNOSIS — Z8249 Family history of ischemic heart disease and other diseases of the circulatory system: Secondary | ICD-10-CM | POA: Diagnosis not present

## 2021-06-25 DIAGNOSIS — Z888 Allergy status to other drugs, medicaments and biological substances status: Secondary | ICD-10-CM | POA: Diagnosis not present

## 2021-06-25 DIAGNOSIS — Z96611 Presence of right artificial shoulder joint: Secondary | ICD-10-CM | POA: Diagnosis not present

## 2021-06-25 DIAGNOSIS — R300 Dysuria: Secondary | ICD-10-CM | POA: Diagnosis not present

## 2021-06-25 DIAGNOSIS — S32512A Fracture of superior rim of left pubis, initial encounter for closed fracture: Secondary | ICD-10-CM | POA: Diagnosis not present

## 2021-06-25 DIAGNOSIS — S72001A Fracture of unspecified part of neck of right femur, initial encounter for closed fracture: Secondary | ICD-10-CM | POA: Diagnosis not present

## 2021-06-25 DIAGNOSIS — G309 Alzheimer's disease, unspecified: Secondary | ICD-10-CM | POA: Insufficient documentation

## 2021-06-25 DIAGNOSIS — Z043 Encounter for examination and observation following other accident: Secondary | ICD-10-CM | POA: Diagnosis not present

## 2021-06-25 DIAGNOSIS — F0280 Dementia in other diseases classified elsewhere without behavioral disturbance: Secondary | ICD-10-CM | POA: Insufficient documentation

## 2021-06-25 DIAGNOSIS — W19XXXA Unspecified fall, initial encounter: Secondary | ICD-10-CM | POA: Diagnosis not present

## 2021-06-25 DIAGNOSIS — Z20822 Contact with and (suspected) exposure to covid-19: Secondary | ICD-10-CM | POA: Insufficient documentation

## 2021-06-25 DIAGNOSIS — R410 Disorientation, unspecified: Secondary | ICD-10-CM | POA: Diagnosis not present

## 2021-06-25 DIAGNOSIS — I10 Essential (primary) hypertension: Secondary | ICD-10-CM | POA: Diagnosis not present

## 2021-06-25 DIAGNOSIS — R9431 Abnormal electrocardiogram [ECG] [EKG]: Secondary | ICD-10-CM | POA: Diagnosis not present

## 2021-06-25 DIAGNOSIS — Z79899 Other long term (current) drug therapy: Secondary | ICD-10-CM | POA: Diagnosis not present

## 2021-06-25 DIAGNOSIS — R197 Diarrhea, unspecified: Secondary | ICD-10-CM | POA: Diagnosis not present

## 2021-06-25 DIAGNOSIS — Z7401 Bed confinement status: Secondary | ICD-10-CM | POA: Diagnosis not present

## 2021-06-25 DIAGNOSIS — R1084 Generalized abdominal pain: Secondary | ICD-10-CM | POA: Diagnosis not present

## 2021-06-25 LAB — CBC WITH DIFFERENTIAL/PLATELET
Abs Immature Granulocytes: 0.05 10*3/uL (ref 0.00–0.07)
Basophils Absolute: 0 10*3/uL (ref 0.0–0.1)
Basophils Relative: 0 %
Eosinophils Absolute: 0.1 10*3/uL (ref 0.0–0.5)
Eosinophils Relative: 1 %
HCT: 38.3 % (ref 36.0–46.0)
Hemoglobin: 12.5 g/dL (ref 12.0–15.0)
Immature Granulocytes: 1 %
Lymphocytes Relative: 12 %
Lymphs Abs: 1.2 10*3/uL (ref 0.7–4.0)
MCH: 32.1 pg (ref 26.0–34.0)
MCHC: 32.6 g/dL (ref 30.0–36.0)
MCV: 98.5 fL (ref 80.0–100.0)
Monocytes Absolute: 0.6 10*3/uL (ref 0.1–1.0)
Monocytes Relative: 6 %
Neutro Abs: 8.1 10*3/uL — ABNORMAL HIGH (ref 1.7–7.7)
Neutrophils Relative %: 80 %
Platelets: 209 10*3/uL (ref 150–400)
RBC: 3.89 MIL/uL (ref 3.87–5.11)
RDW: 12.6 % (ref 11.5–15.5)
WBC: 10.1 10*3/uL (ref 4.0–10.5)
nRBC: 0 % (ref 0.0–0.2)

## 2021-06-25 LAB — COMPREHENSIVE METABOLIC PANEL
ALT: 14 U/L (ref 0–44)
AST: 42 U/L — ABNORMAL HIGH (ref 15–41)
Albumin: 3.6 g/dL (ref 3.5–5.0)
Alkaline Phosphatase: 75 U/L (ref 38–126)
Anion gap: 12 (ref 5–15)
BUN: 22 mg/dL (ref 8–23)
CO2: 22 mmol/L (ref 22–32)
Calcium: 9.3 mg/dL (ref 8.9–10.3)
Chloride: 105 mmol/L (ref 98–111)
Creatinine, Ser: 1.2 mg/dL — ABNORMAL HIGH (ref 0.44–1.00)
GFR, Estimated: 43 mL/min — ABNORMAL LOW (ref >60)
Glucose, Bld: 151 mg/dL — ABNORMAL HIGH (ref 70–99)
Potassium: 4.2 mmol/L (ref 3.5–5.1)
Sodium: 139 mmol/L (ref 135–145)
Total Bilirubin: 0.6 mg/dL (ref 0.3–1.2)
Total Protein: 7 g/dL (ref 6.5–8.1)

## 2021-06-25 LAB — URINALYSIS, ROUTINE W REFLEX MICROSCOPIC
Bilirubin Urine: NEGATIVE
Glucose, UA: NEGATIVE mg/dL
Ketones, ur: NEGATIVE mg/dL
Nitrite: NEGATIVE
Protein, ur: 100 mg/dL — AB
Specific Gravity, Urine: 1.011 (ref 1.005–1.030)
WBC, UA: >50 WBC/hpf — ABNORMAL HIGH (ref 0–5)
pH: 7 (ref 5.0–8.0)

## 2021-06-25 LAB — I-STAT VENOUS BLOOD GAS, ED
Acid-base deficit: 1 mmol/L (ref 0.0–2.0)
Bicarbonate: 24.3 mmol/L (ref 20.0–28.0)
Calcium, Ion: 1.32 mmol/L (ref 1.15–1.40)
HCT: 39 % (ref 36.0–46.0)
Hemoglobin: 13.3 g/dL (ref 12.0–15.0)
O2 Saturation: 99 %
Potassium: 4.5 mmol/L (ref 3.5–5.1)
Sodium: 147 mmol/L — ABNORMAL HIGH (ref 135–145)
TCO2: 26 mmol/L (ref 22–32)
pCO2, Ven: 40.5 mmHg — ABNORMAL LOW (ref 44–60)
pH, Ven: 7.387 (ref 7.25–7.43)
pO2, Ven: 130 mmHg — ABNORMAL HIGH (ref 32–45)

## 2021-06-25 LAB — LACTIC ACID, PLASMA: Lactic Acid, Venous: 1.7 mmol/L (ref 0.5–1.9)

## 2021-06-25 LAB — RAPID URINE DRUG SCREEN, HOSP PERFORMED
Amphetamines: NOT DETECTED
Barbiturates: NOT DETECTED
Benzodiazepines: NOT DETECTED
Cocaine: NOT DETECTED
Opiates: NOT DETECTED
Tetrahydrocannabinol: NOT DETECTED

## 2021-06-25 LAB — AMMONIA: Ammonia: 19 umol/L (ref 9–35)

## 2021-06-25 LAB — RESP PANEL BY RT-PCR (FLU A&B, COVID) ARPGX2
Influenza A by PCR: NEGATIVE
Influenza B by PCR: NEGATIVE
SARS Coronavirus 2 by RT PCR: NEGATIVE

## 2021-06-25 LAB — CBG MONITORING, ED: Glucose-Capillary: 128 mg/dL — ABNORMAL HIGH (ref 70–99)

## 2021-06-25 LAB — ETHANOL: Alcohol, Ethyl (B): <10 mg/dL (ref <10)

## 2021-06-25 LAB — TROPONIN I (HIGH SENSITIVITY): Troponin I (High Sensitivity): 8 ng/L (ref <18)

## 2021-06-25 MED ORDER — SODIUM CHLORIDE 0.9 % IV BOLUS
1000.0000 mL | Freq: Once | INTRAVENOUS | Status: AC
  Administered 2021-06-25: 1000 mL via INTRAVENOUS

## 2021-06-25 MED ORDER — SODIUM CHLORIDE 0.9 % IV SOLN: INTRAVENOUS | Status: DC

## 2021-06-25 MED ORDER — ZINC OXIDE 40 % EX OINT: 1.0000 "application " | TOPICAL_OINTMENT | CUTANEOUS | 0 refills | Status: AC | PRN

## 2021-06-25 MED ORDER — CEFTRIAXONE SODIUM 1 G IJ SOLR
1.0000 g | Freq: Once | INTRAMUSCULAR | Status: AC
  Administered 2021-06-25: 1 g via INTRAVENOUS
  Filled 2021-06-25: qty 10

## 2021-06-25 MED ORDER — CEPHALEXIN 500 MG PO CAPS: 500.0000 mg | ORAL_CAPSULE | Freq: Four times a day (QID) | ORAL | 0 refills | Status: DC

## 2021-06-25 MED ORDER — NYSTATIN 100000 UNIT/GM EX CREA: TOPICAL_CREAM | CUTANEOUS | 0 refills | Status: DC

## 2021-06-25 NOTE — ED Notes (Signed)
[facility] CALL UNABLE TO GIVE PICK UP TIME BUT STATED IT WILL BE QUIET A WAIT ?

## 2021-06-25 NOTE — Discharge Instructions (Signed)
You were evaluated in the Emergency Department and after careful evaluation, we did not find any emergent condition requiring admission or further testing in the hospital. ? ?Your exam/testing today was concerning for urinary tract infection.  You received a dose of IV Rocephin here and will be discharged on Keflex.  Recommend that you follow-up with your PCP to ensure resolution of symptoms. ? ?Please return to the Emergency Department if you experience any worsening of your condition.  Thank you for allowing [place] to be a part of your care. ? ?

## 2021-06-25 NOTE — ED Triage Notes (Signed)
Pt BIB [facility] from [facility]. Pt has been sick a couple days, having diarrhea, not acting normal this AM, and is more disoriented than normal. Normally a/ox4. Last was herself last night 3rd shift. Mostly independent, staff is unsure if she has been eating or drinking normally. BP 03B systolic. HR 80. 12 lead unremarkable. CBG 172.  ?

## 2021-06-25 NOTE — ED Provider Notes (Signed)
?[facility] ?Provider Note ? ? ?CSN: [id number] ?Arrival date & time: 06/25/21  0847 ? ?  ? ?History ? ?Chief Complaint  ?Patient presents with  ? Dysuria  ? ? ?Kimberly Ramirez is a 88 y.o. female. ? ? ?88 year old female with medical history significant for TIA, HTN, Alzheimer's dementia who presents to the emergency department with increased urinary frequency and dysuria.  Additional history was provided by family members who are present bedside who state that the patient is at her normal mental status.  She had reportedly been having episodes of diarrhea and per staff have been more disoriented than usual per EMS report.  She was last normal reportedly last night.  Upon further questioning by patient family members bedside, she is actually at her baseline mental status.  She was reportedly hypotensive with systolic blood pressures in the 70s with a normal heart rate and CBG 172 per EMS.  On arrival, the patient was afebrile, hemodynamically stable with a blood pressure of 117/66.  She was at her baseline dementia and had no complaints. ?  ? ?Home Medications ?Prior to Admission medications   ?Medication Sig Start Date End Date Taking? Authorizing Provider  ?acetaminophen (TYLENOL) 500 MG tablet Take 500 mg by mouth 3 (three) times daily as needed for mild pain or moderate pain.   Yes [provider]  ?acetaminophen (TYLENOL) 500 MG tablet Take 1,000 mg by mouth 3 (three) times daily as needed (severe pain).   Yes [provider]  ?cephALEXin (KEFLEX) 500 MG capsule Take 1 capsule (500 mg total) by mouth 4 (four) times daily. 06/25/21  Yes Regan Lemming, MD  ?Blanca Friend Jilda Panda) LOTN Apply 1 application. topically 3 (three) times daily as needed (dry skin or itching). Apply a small amount topically to body tid prn   Yes [provider]  ?guaifenesin (ROBITUSSIN) 100 MG/5ML syrup Take 10 mLs by mouth every 4 (four) hours as needed for cough.   Yes  [provider]  ?liver oil-zinc oxide (DESITIN) 40 % ointment Apply 1 application. topically as needed for irritation. 06/25/21  Yes Regan Lemming, MD  ?Nutritional Supplements (ENSURE NUTRITION SHAKE) LIQD Take 0.5 Bottles by mouth in the morning and at bedtime. Chocolate flavor preferred.   Yes [provider]  ?nystatin cream (MYCOSTATIN) Apply to affected area 2 times daily 06/25/21  Yes Regan Lemming, MD  ?ondansetron (ZOFRAN ODT) 8 MG disintegrating tablet Take 1 tablet (8 mg total) by mouth every 8 (eight) hours as needed for nausea. 10/25/19  Yes Varney Biles, MD  ?polyethylene glycol (MIRALAX / GLYCOLAX) 17 g packet Take 17 g by mouth daily.   Yes [provider]  ?Polyvinyl Alcohol-Povidone (CLEAR EYES NATURAL TEARS) 5-6 MG/ML SOLN Place 1 drop into both eyes at bedtime.   Yes [provider]  ?fluconazole (DIFLUCAN) 100 MG tablet Take 100 mg by mouth 3 (three) times daily. 01/25/20   [provider]  ?ketoconazole (NIZORAL) 2 % cream Apply 1 application topically daily. 12/20/19   [provider]  ?tiZANidine (ZANAFLEX) 2 MG tablet Take 2 mg by mouth 2 (two) times daily. 11/01/19   [provider]  ?   ? ?Allergies    ?Atorvastatin and Septra [sulfamethoxazole-trimethoprim]   ? ?Review of Systems   ?Review of Systems  ?Unable to perform ROS: Dementia  ? ?Physical Exam ?Updated Vital Signs ?BP (!) 145/89   Pulse 89   Temp 97.7 ?F (36.5 ?C) (Rectal)   Resp 16  SpO2 99%  ?Physical Exam ?Vitals and nursing note reviewed.  ?Constitutional:   ?   General: She is not in acute distress. ?   Appearance: She is well-developed.  ?HENT:  ?   Head: Normocephalic and atraumatic.  ?Eyes:  ?   Conjunctiva/sclera: Conjunctivae normal.  ?Cardiovascular:  ?   Rate and Rhythm: Normal rate and regular rhythm.  ?   Heart sounds: No murmur heard. ?Pulmonary:  ?   Effort: Pulmonary effort is normal. No respiratory distress.  ?   Breath sounds: Normal breath  sounds.  ?Abdominal:  ?   Palpations: Abdomen is soft.  ?   Tenderness: There is no abdominal tenderness.  ?Genitourinary: ?   Comments: Erythema about the labia consistent with likely vaginitis ?Musculoskeletal:     ?   General: No swelling.  ?   Cervical back: Neck supple.  ?Skin: ?   General: Skin is warm and dry.  ?   Capillary Refill: Capillary refill takes less than 2 seconds.  ?Neurological:  ?   Mental Status: She is alert.  ?   Comments: MENTAL STATUS EXAM:    ?Orientation: Alert and oriented to person, disoriented to place and time ?Memory: Cooperative, follows commands well.  ?Language: Speech is clear and language is normal.  ? ?CRANIAL NERVES:    ?CN 2 (Optic): Visual fields intact to confrontation.  ?CN 3,4,6 (EOM): Pupils equal and reactive to light. Full extraocular eye movement without nystagmus.  ?CN 5 (Trigeminal): Facial sensation is normal, no weakness of masticatory muscles.  ?CN 7 (Facial): No facial weakness or asymmetry.  ?CN 8 (Auditory): Auditory acuity grossly normal.  ?CN 9,10 (Glossophar): The uvula is midline, the palate elevates symmetrically.  ?CN 11 (spinal access): Normal sternocleidomastoid and trapezius strength.  ?CN 12 (Hypoglossal): The tongue is midline. No atrophy or fasciculations..  ? ?MOTOR:  Muscle Strength: 5/5RUE, 5/5LUE, 5/5RLE, 5/5LLE.  ? ?COORDINATION:   Intact finger-to-nose, no tremor.  ? ?SENSATION:   Intact to light touch all four extremities. ? ? ?  ?Psychiatric:     ?   Mood and Affect: Mood normal.  ? ? ?ED Results / Procedures / Treatments   ?Labs ?(all labs ordered are listed, but only abnormal results are displayed) ?Labs Reviewed  ?COMPREHENSIVE METABOLIC PANEL - Abnormal; Notable for the following components:  ?    Result Value  ? Glucose, Bld 151 (*)   ? Creatinine, Ser 1.20 (*)   ? AST 42 (*)   ? GFR, Estimated 43 (*)   ? All other components within normal limits  ?CBC WITH DIFFERENTIAL/PLATELET - Abnormal; Notable for the following components:  ?  Neutro Abs 8.1 (*)   ? All other components within normal limits  ?URINALYSIS, ROUTINE W REFLEX MICROSCOPIC - Abnormal; Notable for the following components:  ? APPearance CLOUDY (*)   ? Hgb urine dipstick SMALL (*)   ? Protein, ur 100 (*)   ? Leukocytes,Ua LARGE (*)   ? WBC, UA >50 (*)   ? Bacteria, UA MANY (*)   ? All other components within normal limits  ?CBG MONITORING, ED - Abnormal; Notable for the following components:  ? Glucose-Capillary 128 (*)   ? All other components within normal limits  ?I-STAT VENOUS BLOOD GAS, ED - Abnormal; Notable for the following components:  ? pCO2, Ven 40.5 (*)   ? pO2, Ven 130 (*)   ? Sodium 147 (*)   ? All other components within normal limits  ?RESP PANEL BY  RT-PCR (FLU A&B, COVID) ARPGX2  ?CULTURE, BLOOD (ROUTINE X 2)  ?CULTURE, BLOOD (ROUTINE X 2)  ?URINE CULTURE  ?AMMONIA  ?LACTIC ACID, PLASMA  ?RAPID URINE DRUG SCREEN, HOSP PERFORMED  ?ETHANOL  ?TROPONIN I (HIGH SENSITIVITY)  ? ? ?EKG ?EKG Interpretation ? ?Date/Time:  Tuesday June 25 2021 08:54:06 EDT ?Ventricular Rate:  65 ?PR Interval:  127 ?QRS Duration: 98 ?QT Interval:  408 ?QTC Calculation: 425 ?R Axis:   62 ?Text Interpretation: Sinus rhythm Abnormal T, consider ischemia, lateral leads Borderline ST elevation, anterior leads Confirmed by Regan Lemming ([id number]) on 06/25/2021 9:09:20 AM ? ?Radiology ?CT HEAD WO CONTRAST ? ?Result Date: 06/25/2021 ?CLINICAL DATA:  Mental status change. EXAM: CT HEAD WITHOUT CONTRAST TECHNIQUE: Contiguous axial images were obtained from the base of the skull through the vertex without intravenous contrast. RADIATION DOSE REDUCTION: This exam was performed according to the departmental dose-optimization program which includes automated exposure control, adjustment of the mA and/or kV according to patient size and/or use of iterative reconstruction technique. COMPARISON:  09/04/2020 FINDINGS: Brain: No evidence of acute infarction, hemorrhage, extra-axial collection, ventriculomegaly, or  mass effect. Generalized cerebral atrophy. Periventricular white matter low attenuation likely secondary to microangiopathy. Vascular: Cerebrovascular atherosclerotic calcifications are noted. Skull: Negative for fr

## 2021-06-25 NOTE — ED Notes (Addendum)
Pt alert, NAD, calm, interactive, at baseline per family  x2 at [facility]. Meal ordered. Rocephin complete. Pending transport arrival. CBIR.  ?

## 2021-06-25 NOTE — ED Notes (Signed)
Did ekg shown to Dr Kimberly Ramirez patient is resting with call bell in reach got patient some warm blankets ?

## 2021-06-25 NOTE — ED Notes (Addendum)
REPORT TO ABBOTSWOOD ATTEMPTED - NO ANSWER ?

## 2021-06-27 ENCOUNTER — Emergency Department (HOSPITAL_COMMUNITY): Payer: Medicare Other

## 2021-06-27 ENCOUNTER — Encounter (HOSPITAL_COMMUNITY): Payer: Self-pay

## 2021-06-27 ENCOUNTER — Other Ambulatory Visit: Payer: Self-pay

## 2021-06-27 ENCOUNTER — Observation Stay (HOSPITAL_COMMUNITY)
Admission: EM | Admit: 2021-06-27 | Discharge: 2021-06-28 | DRG: 536 | Disposition: A | Discharge status: Skilled Nursing Facility | Payer: Medicare Other | Source: Skilled Nursing Facility | Attending: Internal Medicine | Admitting: Internal Medicine

## 2021-06-27 DIAGNOSIS — Z888 Allergy status to other drugs, medicaments and biological substances status: Secondary | ICD-10-CM

## 2021-06-27 DIAGNOSIS — Z820 Family history of epilepsy and other diseases of the nervous system: Secondary | ICD-10-CM

## 2021-06-27 DIAGNOSIS — S32512A Fracture of superior rim of left pubis, initial encounter for closed fracture: Secondary | ICD-10-CM | POA: Diagnosis not present

## 2021-06-27 DIAGNOSIS — S20411A Abrasion of right back wall of thorax, initial encounter: Secondary | ICD-10-CM | POA: Diagnosis present

## 2021-06-27 DIAGNOSIS — S72001A Fracture of unspecified part of neck of right femur, initial encounter for closed fracture: Secondary | ICD-10-CM | POA: Diagnosis not present

## 2021-06-27 DIAGNOSIS — R9431 Abnormal electrocardiogram [ECG] [EKG]: Secondary | ICD-10-CM | POA: Diagnosis not present

## 2021-06-27 DIAGNOSIS — M5021 Other cervical disc displacement,  high cervical region: Secondary | ICD-10-CM | POA: Diagnosis not present

## 2021-06-27 DIAGNOSIS — N760 Acute vaginitis: Secondary | ICD-10-CM | POA: Diagnosis present

## 2021-06-27 DIAGNOSIS — J320 Chronic maxillary sinusitis: Secondary | ICD-10-CM | POA: Diagnosis not present

## 2021-06-27 DIAGNOSIS — J439 Emphysema, unspecified: Secondary | ICD-10-CM | POA: Diagnosis not present

## 2021-06-27 DIAGNOSIS — R079 Chest pain, unspecified: Secondary | ICD-10-CM | POA: Diagnosis not present

## 2021-06-27 DIAGNOSIS — D649 Anemia, unspecified: Secondary | ICD-10-CM | POA: Diagnosis present

## 2021-06-27 DIAGNOSIS — F0280 Dementia in other diseases classified elsewhere without behavioral disturbance: Secondary | ICD-10-CM | POA: Diagnosis present

## 2021-06-27 DIAGNOSIS — Z8249 Family history of ischemic heart disease and other diseases of the circulatory system: Secondary | ICD-10-CM

## 2021-06-27 DIAGNOSIS — W19XXXA Unspecified fall, initial encounter: Secondary | ICD-10-CM | POA: Diagnosis not present

## 2021-06-27 DIAGNOSIS — M47812 Spondylosis without myelopathy or radiculopathy, cervical region: Secondary | ICD-10-CM | POA: Diagnosis not present

## 2021-06-27 DIAGNOSIS — G309 Alzheimer's disease, unspecified: Secondary | ICD-10-CM | POA: Diagnosis present

## 2021-06-27 DIAGNOSIS — S0003XA Contusion of scalp, initial encounter: Secondary | ICD-10-CM | POA: Diagnosis present

## 2021-06-27 DIAGNOSIS — S32592G Other specified fracture of left pubis, subsequent encounter for fracture with delayed healing: Secondary | ICD-10-CM

## 2021-06-27 DIAGNOSIS — S0990XA Unspecified injury of head, initial encounter: Secondary | ICD-10-CM | POA: Diagnosis not present

## 2021-06-27 DIAGNOSIS — N390 Urinary tract infection, site not specified: Secondary | ICD-10-CM

## 2021-06-27 DIAGNOSIS — Z882 Allergy status to sulfonamides status: Secondary | ICD-10-CM

## 2021-06-27 DIAGNOSIS — I10 Essential (primary) hypertension: Secondary | ICD-10-CM | POA: Diagnosis not present

## 2021-06-27 DIAGNOSIS — Z79899 Other long term (current) drug therapy: Secondary | ICD-10-CM

## 2021-06-27 DIAGNOSIS — Y92129 Unspecified place in nursing home as the place of occurrence of the external cause: Secondary | ICD-10-CM

## 2021-06-27 DIAGNOSIS — S32592A Other specified fracture of left pubis, initial encounter for closed fracture: Principal | ICD-10-CM | POA: Diagnosis present

## 2021-06-27 DIAGNOSIS — Z20822 Contact with and (suspected) exposure to covid-19: Secondary | ICD-10-CM | POA: Diagnosis not present

## 2021-06-27 DIAGNOSIS — Z66 Do not resuscitate: Secondary | ICD-10-CM | POA: Diagnosis present

## 2021-06-27 DIAGNOSIS — Z8673 Personal history of transient ischemic attack (TIA), and cerebral infarction without residual deficits: Secondary | ICD-10-CM

## 2021-06-27 DIAGNOSIS — D72829 Elevated white blood cell count, unspecified: Secondary | ICD-10-CM | POA: Diagnosis present

## 2021-06-27 DIAGNOSIS — F0390 Unspecified dementia without behavioral disturbance: Secondary | ICD-10-CM

## 2021-06-27 DIAGNOSIS — Z96611 Presence of right artificial shoulder joint: Secondary | ICD-10-CM | POA: Diagnosis present

## 2021-06-27 DIAGNOSIS — Z8616 Personal history of COVID-19: Secondary | ICD-10-CM

## 2021-06-27 DIAGNOSIS — Z043 Encounter for examination and observation following other accident: Secondary | ICD-10-CM | POA: Diagnosis not present

## 2021-06-27 DIAGNOSIS — M160 Bilateral primary osteoarthritis of hip: Secondary | ICD-10-CM | POA: Diagnosis not present

## 2021-06-27 LAB — COMPREHENSIVE METABOLIC PANEL
ALT: 24 U/L (ref 0–44)
AST: 67 U/L — ABNORMAL HIGH (ref 15–41)
Albumin: 4.1 g/dL (ref 3.5–5.0)
Alkaline Phosphatase: 82 U/L (ref 38–126)
Anion gap: 9 (ref 5–15)
BUN: 27 mg/dL — ABNORMAL HIGH (ref 8–23)
CO2: 25 mmol/L (ref 22–32)
Calcium: 9.3 mg/dL (ref 8.9–10.3)
Chloride: 101 mmol/L (ref 98–111)
Creatinine, Ser: 0.89 mg/dL (ref 0.44–1.00)
GFR, Estimated: >60 mL/min (ref >60)
Glucose, Bld: 168 mg/dL — ABNORMAL HIGH (ref 70–99)
Potassium: 3.5 mmol/L (ref 3.5–5.1)
Sodium: 135 mmol/L (ref 135–145)
Total Bilirubin: 0.7 mg/dL (ref 0.3–1.2)
Total Protein: 7.9 g/dL (ref 6.5–8.1)

## 2021-06-27 LAB — CBC WITH DIFFERENTIAL/PLATELET
Abs Immature Granulocytes: 0.11 10*3/uL — ABNORMAL HIGH (ref 0.00–0.07)
Basophils Absolute: 0 10*3/uL (ref 0.0–0.1)
Basophils Relative: 0 %
Eosinophils Absolute: 0 10*3/uL (ref 0.0–0.5)
Eosinophils Relative: 0 %
HCT: 35.8 % — ABNORMAL LOW (ref 36.0–46.0)
Hemoglobin: 11.8 g/dL — ABNORMAL LOW (ref 12.0–15.0)
Immature Granulocytes: 1 %
Lymphocytes Relative: 3 %
Lymphs Abs: 0.5 10*3/uL — ABNORMAL LOW (ref 0.7–4.0)
MCH: 31.8 pg (ref 26.0–34.0)
MCHC: 33 g/dL (ref 30.0–36.0)
MCV: 96.5 fL (ref 80.0–100.0)
Monocytes Absolute: 0.9 10*3/uL (ref 0.1–1.0)
Monocytes Relative: 5 %
Neutro Abs: 16.2 10*3/uL — ABNORMAL HIGH (ref 1.7–7.7)
Neutrophils Relative %: 91 %
Platelets: 176 10*3/uL (ref 150–400)
RBC: 3.71 MIL/uL — ABNORMAL LOW (ref 3.87–5.11)
RDW: 12.5 % (ref 11.5–15.5)
WBC: 17.8 10*3/uL — ABNORMAL HIGH (ref 4.0–10.5)
nRBC: 0 % (ref 0.0–0.2)

## 2021-06-27 LAB — URINE CULTURE: Culture: <10000 — AB

## 2021-06-27 LAB — URINALYSIS, ROUTINE W REFLEX MICROSCOPIC
Bilirubin Urine: NEGATIVE
Glucose, UA: NEGATIVE mg/dL
Hgb urine dipstick: NEGATIVE
Ketones, ur: 5 mg/dL — AB
Nitrite: NEGATIVE
Protein, ur: 30 mg/dL — AB
Specific Gravity, Urine: 1.028 (ref 1.005–1.030)
pH: 5 (ref 5.0–8.0)

## 2021-06-27 LAB — RESP PANEL BY RT-PCR (FLU A&B, COVID) ARPGX2
Influenza A by PCR: NEGATIVE
Influenza B by PCR: NEGATIVE
SARS Coronavirus 2 by RT PCR: POSITIVE — AB

## 2021-06-27 LAB — LACTIC ACID, PLASMA
Lactic Acid, Venous: 1.6 mmol/L (ref 0.5–1.9)
Lactic Acid, Venous: 1.5 mmol/L (ref 0.5–1.9)

## 2021-06-27 MED ORDER — SODIUM CHLORIDE 0.9 % IV SOLN
1.0000 g | Freq: Once | INTRAVENOUS | Status: AC
  Administered 2021-06-27: 1 g via INTRAVENOUS
  Filled 2021-06-27: qty 10

## 2021-06-27 MED ORDER — SODIUM CHLORIDE 0.9 % IV SOLN: 1.0000 g | INTRAVENOUS | Status: DC

## 2021-06-27 MED ORDER — OXYCODONE HCL 5 MG/5ML PO SOLN
5.0000 mg | Freq: Four times a day (QID) | ORAL | Status: DC | PRN
  Administered 2021-06-27 – 2021-06-28 (×4): 5 mg via ORAL
  Filled 2021-06-27 (×5): qty 5

## 2021-06-27 MED ORDER — ACETAMINOPHEN 160 MG/5ML PO SOLN
650.0000 mg | Freq: Four times a day (QID) | ORAL | Status: DC | PRN
  Administered 2021-06-27 – 2021-06-28 (×2): 650 mg via ORAL
  Filled 2021-06-27 (×2): qty 20.3

## 2021-06-27 MED ORDER — IBUPROFEN 200 MG PO TABS
400.0000 mg | ORAL_TABLET | ORAL | Status: DC | PRN
  Administered 2021-06-27: 400 mg via ORAL
  Filled 2021-06-27: qty 2

## 2021-06-27 MED ORDER — HEPARIN SODIUM (PORCINE) 5000 UNIT/ML IJ SOLN
5000.0000 [IU] | Freq: Three times a day (TID) | INTRAMUSCULAR | Status: DC
  Administered 2021-06-27 – 2021-06-28 (×4): 5000 [IU] via SUBCUTANEOUS
  Filled 2021-06-27 (×4): qty 1

## 2021-06-27 MED ORDER — ACETAMINOPHEN 650 MG RE SUPP: 650.0000 mg | Freq: Four times a day (QID) | RECTAL | Status: DC | PRN

## 2021-06-27 MED ORDER — STERILE WATER FOR INJECTION IJ SOLN
INTRAMUSCULAR | Status: AC
  Filled 2021-06-27: qty 10

## 2021-06-27 MED ORDER — ACETAMINOPHEN 325 MG PO TABS: 650.0000 mg | ORAL_TABLET | Freq: Four times a day (QID) | ORAL | Status: DC | PRN

## 2021-06-27 MED ORDER — ACETAMINOPHEN 325 MG PO TABS
650.0000 mg | ORAL_TABLET | Freq: Once | ORAL | Status: AC
  Administered 2021-06-27: 650 mg via ORAL
  Filled 2021-06-27: qty 2

## 2021-06-27 MED ORDER — ENSURE ENLIVE PO LIQD
237.0000 mL | Freq: Two times a day (BID) | ORAL | Status: DC
  Administered 2021-06-27 – 2021-06-28 (×4): 237 mL via ORAL

## 2021-06-27 MED ORDER — OXYCODONE HCL 5 MG PO TABS
5.0000 mg | ORAL_TABLET | Freq: Four times a day (QID) | ORAL | Status: DC | PRN
  Administered 2021-06-27: 5 mg via ORAL
  Filled 2021-06-27: qty 1

## 2021-06-27 MED ORDER — CEPHALEXIN 500 MG PO CAPS
500.0000 mg | ORAL_CAPSULE | Freq: Two times a day (BID) | ORAL | Status: DC
  Administered 2021-06-28: 500 mg via ORAL
  Filled 2021-06-27: qty 1

## 2021-06-27 MED ORDER — IBUPROFEN 100 MG/5ML PO SUSP
400.0000 mg | Freq: Four times a day (QID) | ORAL | Status: DC | PRN
  Filled 2021-06-27: qty 20

## 2021-06-27 NOTE — ED Triage Notes (Signed)
Pt comes via [facility] from [facility] assisted [facility] after a unwitnessed fall, hx of frequent falls, c/o of pain in coccyx region, no on thinners, hx of Alzheimer  ?

## 2021-06-27 NOTE — ED Provider Notes (Signed)
?[facility] DEPT ?Provider Note ? ? ?CSN: [id number] ?Arrival date & time: 06/27/21  0217 ? ?  ? ?History ? ?Chief Complaint  ?Patient presents with  ? Fall  ? ? ?Lauriana Denes is a 88 y.o. female. ? ?The history is provided by the EMS personnel, the patient and medical records.  ?Fall ?Maeleigh Buschman is a 88 y.o. female who presents to the Emergency Department complaining of fall.  Level 5 caveat due to dementia.  History is provided by EMS and the patient.  She presents to the emergency department by EMS from [facility] assisted living facility for evaluation of injuries following an unwitnessed fall.  Patient was found on the floor.  Per report she is baseline confused and complains of chronic sacral pain.  She was recently seen in the emergency department and started on antibiotics for urinary tract infection.  Patient states that she did fall, she is unclear what caused the fall.  Her current complaints are pain in her pelvis, headache. ? ?Additional history obtained from staff at [facility] assisted living facility.  Patient was found facedown on the floor.  Patient could not say what happened.  She normally uses a walker for ambulation and the walker was not next to her.  Facility was not able to reach family. ? ?  ? ?Home Medications ?Prior to Admission medications   ?Medication Sig Start Date End Date Taking? Authorizing Provider  ?acetaminophen (TYLENOL) 500 MG tablet Take 500 mg by mouth 3 (three) times daily as needed for mild pain or moderate pain.   Yes [provider]  ?acetaminophen (TYLENOL) 500 MG tablet Take 1,000 mg by mouth 3 (three) times daily as needed (severe pain).   Yes [provider]  ?cephALEXin (KEFLEX) 500 MG capsule Take 1 capsule (500 mg total) by mouth 4 (four) times daily. 06/25/21  Yes Regan Lemming, MD  ?Blanca Friend Jilda Panda) LOTN Apply 1 application. topically 3 (three) times daily as needed (dry or itchy skin).   Yes  [provider]  ?guaifenesin (ROBITUSSIN) 100 MG/5ML syrup Take 10 mLs by mouth every 4 (four) hours as needed for cough.   Yes [provider]  ?liver oil-zinc oxide (DESITIN) 40 % ointment Apply 1 application. topically as needed for irritation. 06/25/21  Yes Regan Lemming, MD  ?Nutritional Supplements (ENSURE NUTRITION SHAKE) LIQD Take 0.5 Bottles by mouth in the morning and at bedtime. Chocolate flavor preferred.   Yes [provider]  ?nystatin cream (MYCOSTATIN) Apply to affected area 2 times daily ?Patient taking differently: 1 application. 3 (three) times daily as needed (for redness and rash). Apply topically under breast and groin area 06/25/21  Yes Regan Lemming, MD  ?ondansetron (ZOFRAN ODT) 8 MG disintegrating tablet Take 1 tablet (8 mg total) by mouth every 8 (eight) hours as needed for nausea. 10/25/19  Yes Varney Biles, MD  ?polyethylene glycol (MIRALAX / GLYCOLAX) 17 g packet Take 17 g by mouth daily.   Yes [provider]  ?Polyvinyl Alcohol-Povidone (CLEAR EYES NATURAL TEARS) 5-6 MG/ML SOLN Place 1 drop into both eyes at bedtime.   Yes [provider]  ?   ? ?Allergies    ?Atorvastatin and Septra [sulfamethoxazole-trimethoprim]   ? ?Review of Systems   ?Review of Systems  ?All other systems reviewed and are negative. ? ?Physical Exam ?Updated Vital Signs ?BP (!) 143/61   Pulse 95   Temp 97.8 ?F (36.6 ?C) (Oral)   Resp (!) 23   SpO2  94%  ?Physical Exam ?Vitals and nursing note reviewed.  ?Constitutional:   ?   Appearance: She is well-developed.  ?HENT:  ?   Head: Normocephalic.  ?   Comments: Ecchymosis and soft tissue swelling to the temple ?Cardiovascular:  ?   Rate and Rhythm: Regular rhythm. Tachycardia present.  ?   Heart sounds: No murmur heard. ?Pulmonary:  ?   Effort: Pulmonary effort is normal. No respiratory distress.  ?   Breath sounds: Normal breath sounds.  ?   Comments: Abrasion over the right upper back ?Abdominal:  ?   Palpations:  Abdomen is soft.  ?   Tenderness: There is no guarding or rebound.  ?   Comments: Mild suprapubic tenderness  ?Musculoskeletal:  ?   Comments: Ecchymosis over the left shoulder, left wrist without any focal tenderness to palpation.  She is able to fully range the left shoulder, wrist, hand.  No significant ecchymosis over bilateral lower extremities.  She does have mild tenderness to palpation over bilateral hips, left thigh.  ?Skin: ?   General: Skin is warm and dry.  ?Neurological:  ?   Mental Status: She is alert and oriented to person, place, and time.  ?   Comments: 4 out of 5 strength in all 4 extremities.  Oriented to person, place.  Disoriented to time.  ? ? ?ED Results / Procedures / Treatments   ?Labs ?(all labs ordered are listed, but only abnormal results are displayed) ?Labs Reviewed  ?COMPREHENSIVE METABOLIC PANEL - Abnormal; Notable for the following components:  ?    Result Value  ? Glucose, Bld 168 (*)   ? BUN 27 (*)   ? AST 67 (*)   ? All other components within normal limits  ?CBC WITH DIFFERENTIAL/PLATELET - Abnormal; Notable for the following components:  ? WBC 17.8 (*)   ? RBC 3.71 (*)   ? Hemoglobin 11.8 (*)   ? HCT 35.8 (*)   ? Neutro Abs 16.2 (*)   ? Lymphs Abs 0.5 (*)   ? Abs Immature Granulocytes 0.11 (*)   ? All other components within normal limits  ?URINE CULTURE  ?CULTURE, BLOOD (ROUTINE X 2)  ?CULTURE, BLOOD (ROUTINE X 2)  ?RESP PANEL BY RT-PCR (FLU A&B, COVID) ARPGX2  ?URINALYSIS, ROUTINE W REFLEX MICROSCOPIC  ?LACTIC ACID, PLASMA  ?LACTIC ACID, PLASMA  ? ? ?EKG ?None ? ?Radiology ?DG Chest 2 View ? ?Result Date: 06/27/2021 ?CLINICAL DATA:  Pain after fall out of bed. EXAM: CHEST - 2 VIEW COMPARISON:  10/25/2019 FINDINGS: Shallow inspiration. Heart size and pulmonary vascularity are normal. Emphysematous changes in the lungs. No airspace disease or consolidation. No pleural effusions. No pneumothorax. Postoperative changes in the right shoulder. No acute bony abnormalities are  identified. IMPRESSION: Emphysematous changes in the lungs. No evidence of active pulmonary disease. Electronically Signed   By: Lucienne Capers M.D.   On: 06/27/2021 03:20  ? ?CT Head Wo Contrast ? ?Result Date: 06/27/2021 ?CLINICAL DATA:  Unwitnessed fall EXAM: CT HEAD WITHOUT CONTRAST CT CERVICAL SPINE WITHOUT CONTRAST TECHNIQUE: Multidetector CT imaging of the head and cervical spine was performed following the standard protocol without intravenous contrast. Multiplanar CT image reconstructions of the cervical spine were also generated. RADIATION DOSE REDUCTION: This exam was performed according to the departmental dose-optimization program which includes automated exposure control, adjustment of the mA and/or kV according to patient size and/or use of iterative reconstruction technique. COMPARISON:  05/30/2017 CT head and cervical spine, 09/04/2020 CT head FINDINGS: CT  HEAD FINDINGS Brain: No evidence of acute infarction, hemorrhage, cerebral edema, mass, mass effect, or midline shift. No hydrocephalus or extra-axial fluid collection. Periventricular white matter changes, likely the sequela of chronic small vessel ischemic disease. Grossly unchanged degree of cerebral volume loss. Vascular: No hyperdense vessel. Atherosclerotic calcifications in the intracranial carotid and vertebral arteries. Skull: Normal. Negative for fracture or focal lesion. Sinuses/Orbits: Chronic left maxillary sinusitis. Partial opacification of the ethmoid air cells. Status post bilateral lens replacements. Other: The mastoids are well aerated. CT CERVICAL SPINE FINDINGS Alignment: Normal. Skull base and vertebrae: No acute fracture. No primary bone lesion or focal pathologic process. Soft tissues and spinal canal: No prevertebral fluid or swelling. No visible canal hematoma. Disc levels: Redemonstrated multilevel degenerative changes. Focal central disc protrusion C4-C5, unchanged, which indents the ventral spinal cord and causes  mild-to-moderate spinal canal stenosis. Additional small disc bulge at C3-C4, which does not cause significant spinal canal stenosis. Multilevel uncovertebral and facet arthropathy which causes severe neural foramin

## 2021-06-27 NOTE — Progress Notes (Signed)
Infection Prevention  ?Unit:[facility] ?Regarding: Pt Covid status ?IP Recommendation: CT Value 42. Contacted [facility] and spoke with Jolyne Loa, Resident Care Coordinator. Pt with Covid in early Feb. Pt current VS/Status not indicative of Covid. No Airborne/Contact precautions needed currently. LPN H. Brocket notified.  ?

## 2021-06-27 NOTE — Evaluation (Signed)
Physical Therapy Evaluation ?Patient Details ?Name: Kimberly Ramirez ?MRN: [id number] ?DOB: September 29, 1927 ?Today's Date: 06/27/2021 ? ?History of Present Illness ? 88 y.o. female with history of TIA, dementia was brought to the ER after patient had a unwitnessed fall and was complaining of back pain. Dx of pubic ramus fx.   Patient had been to the ER about 3 days prior to this admission when patient was having increasing confusion diarrhea at that time was diagnosed with UTI.  ?Clinical Impression ? Pt admitted with above diagnosis. Total assist for bed mobility and to transfer bed to recliner. Attempted sit to stand with a RW, however pt was unable to come to full upright position with +2 assistance. At baseline, she was ambulating with a RW at an ALF and receiving assistance for ADLs.  At present, she needs ST-SNF for rehab. Her daughter, Sharl Ma, was present for PT session and agrees to ST-SNF.  Pt currently with functional limitations due to the deficits listed below (see PT Problem List). Pt will benefit from skilled PT to increase their independence and safety with mobility to allow discharge to the venue listed below.   ?   ?   ? ?Recommendations for follow up therapy are one component of a multi-disciplinary discharge planning process, led by the attending physician.  Recommendations may be updated based on patient status, additional functional criteria and insurance authorization. ? ?Follow Up Recommendations Skilled nursing-short term rehab (<3 hours/day) ? ?  ?Assistance Recommended at Discharge Frequent or constant Supervision/Assistance  ?Patient can return home with the following ? A lot of help with bathing/dressing/bathroom;Help with stairs or ramp for entrance;Direct supervision/assist for financial management;Direct supervision/assist for medications management;Assist for transportation;Assistance with cooking/housework;Two people to help with walking and/or transfers ? ?  ?Equipment Recommendations None  recommended by PT  ?Recommendations for Other Services ?    ?  ?Functional Status Assessment Patient has had a recent decline in their functional status and demonstrates the ability to make significant improvements in function in a reasonable and predictable amount of time.  ? ?  ?Precautions / Restrictions Precautions ?Precautions: Fall ?Precaution Comments: fell just PTA ?Restrictions ?Weight Bearing Restrictions: No  ? ?  ? ?Mobility ? Bed Mobility ?Overal bed mobility: Needs Assistance ?Bed Mobility: Supine to Sit ?  ?  ?Supine to sit: Total assist ?  ?  ?General bed mobility comments: assist to raise trunk and pivot hips to edge of bed ?  ? ?Transfers ?Overall transfer level: Needs assistance ?Equipment used: Rolling walker (2 wheels) ?Transfers: Sit to/from Stand, Bed to chair/wheelchair/BSC ?Sit to Stand: +2 physical assistance, Total assist ?Stand pivot transfers: Total assist ?  ?  ?  ?  ?General transfer comment: attempted sit to stand with RW however pt able to come to full upright position with +2 assist, she maintained LLE in NWB position. Stand pivot tx bed to 3 in 1 then to recliner with total assist (pt 15%). ?  ? ?Ambulation/Gait ?  ?  ?  ?  ?  ?  ?  ?General Gait Details: unable ? ?Stairs ?  ?  ?  ?  ?  ? ?Wheelchair Mobility ?  ? ?Modified Rankin (Stroke Patients Only) ?  ? ?  ? ?Balance Overall balance assessment: Needs assistance, History of Falls ?Sitting-balance support: Feet supported ?Sitting balance-Leahy Scale: Fair ?  ?  ?Standing balance support: Bilateral upper extremity supported ?Standing balance-Leahy Scale: Zero ?  ?  ?  ?  ?  ?  ?  ?  ?  ?  ?  ?  ?   ? ? ? ?  Pertinent Vitals/Pain Pain Assessment ?Pain Assessment: Faces ?Faces Pain Scale: Hurts little more ?Pain Location: L pelvis ?Pain Descriptors / Indicators: Grimacing, Guarding, Moaning ?Pain Intervention(s): Limited activity within patient's tolerance, Monitored during session, Premedicated before session  ? ? ?Home Living  Family/patient expects to be discharged to:: Skilled nursing facility ?  ?  ?  ?  ?  ?  ?  ?  ?  ?Additional Comments: admitted from [facility]  ?  ?Prior Function Prior Level of Function : Needs assist ? Cognitive Assist : ADLs (cognitive) ?  ?  ?Physical Assist : ADLs (physical) ?  ?  ?Mobility Comments: walked with RW ?ADLs Comments: assist for ADLs ?  ? ? ?Hand Dominance  ?   ? ?  ?Extremity/Trunk Assessment  ? Upper Extremity Assessment ?Upper Extremity Assessment: Generalized weakness ?  ? ?Lower Extremity Assessment ?Lower Extremity Assessment: LLE deficits/detail ?LLE Deficits / Details: able to actively extend L knee, hip ROM not assessed 2* pain with movement ?LLE: Unable to fully assess due to pain ?LLE Sensation: WNL ?  ? ?Cervical / Trunk Assessment ?Cervical / Trunk Assessment: Kyphotic  ?Communication  ? Communication: No difficulties  ?Cognition Arousal/Alertness: Awake/alert ?Behavior During Therapy: [facility] for tasks assessed/performed ?Overall Cognitive Status: History of cognitive impairments - at baseline ?  ?  ?  ?  ?  ?  ?  ?  ?  ?  ?  ?  ?  ?  ?  ?  ?  ?  ?  ? ?  ?General Comments   ? ?  ?Exercises    ? ?Assessment/Plan  ?  ?PT Assessment Patient needs continued PT services  ?PT Problem List Decreased activity tolerance;Decreased balance;Pain;Decreased strength;Decreased mobility;Decreased cognition ? ?   ?  ?PT Treatment Interventions Gait training;Therapeutic exercise;Therapeutic activities;Balance training;Functional mobility training;Patient/family education   ? ?PT Goals (Current goals can be found in the Care Plan section)  ?Acute Rehab PT Goals ?Patient Stated Goal: ST-SNF for rehab ?PT Goal Formulation: With family ?Time For Goal Achievement: 07/11/21 ?Potential to Achieve Goals: Good ? ?  ?Frequency Min 3X/week ?  ? ? ?Co-evaluation   ?  ?  ?  ?  ? ? ?  ?AM-PAC PT "6 Clicks" Mobility  ?Outcome Measure Help needed turning from your back to your side while in a flat bed without using  bedrails?: Total ?Help needed moving from lying on your back to sitting on the side of a flat bed without using bedrails?: Total ?Help needed moving to and from a bed to a chair (including a wheelchair)?: Total ?Help needed standing up from a chair using your arms (e.g., wheelchair or bedside chair)?: Total ?Help needed to walk in hospital room?: Total ?Help needed climbing 3-5 steps with a railing? : Total ?6 Click Score: 6 ? ?  ?End of Session Equipment Utilized During Treatment: Gait belt ?Activity Tolerance: Patient limited by pain ?Patient left: in chair;with call bell/phone within reach;with family/visitor present;with chair alarm set ?Nurse Communication: Mobility status ?PT Visit Diagnosis: Difficulty in walking, not elsewhere classified (R26.2);Muscle weakness (generalized) (M62.81);History of falling (Z91.81) ?  ? ?Time: 0254-2706 ?PT Time Calculation (min) (ACUTE ONLY): 30 min ? ? ?Charges:   PT Evaluation ?$PT Eval Moderate Complexity: 1 Mod ?PT Treatments ?$Therapeutic Activity: 8-22 mins ?  ?   ? ?Blondell Reveal Kistler PT 06/27/2021  ?Acute Rehabilitation Services ?Pager [phone] ?Office [phone] ? ? ?

## 2021-06-27 NOTE — H&P (Signed)
History and Physical    Kimberly Ramirez UEA:[id number] DOB: Sep 02, 1927 DOA: 06/27/2021  PCP: Merlene Laughter, MD                    Patient coming from: Skilled nursing facility.  Chief Complaint: Unwitnessed fall.  HPI: Kimberly Ramirez is a 88 y.o. female with history of TIA, dementia was brought to the ER after patient had a unwitnessed fall and was complaining of back pain.  Patient had been to the ER about 3 days ago when patient was having increasing confusion diarrhea at that time was diagnosed with UTI and discharged on Keflex.  ED Course: In the ER patient is hemodynamically stable labs showed leukocytosis CT scan of the head and neck were unremarkable.  X-ray of the hip shows left superior rami fracture.  Patient admitted for observation.  COVID test is pending.  Since patient has leukocytosis was started on empiric antibiotics for possible UTI.  UA is pending.  Review of Systems: As per HPI, rest all negative.   Past Medical History:  Diagnosis Date   Alzheimer disease (HCC)    Closed right hip fracture (HCC) 05/2017   Hypertension    TIA (transient ischemic attack) 2019    Past Surgical History:  Procedure Laterality Date   ABDOMINAL HYSTERECTOMY     FEMUR IM NAIL Right 05/31/2017   FRACTURE SURGERY     left leg   INTRAMEDULLARY (IM) NAIL INTERTROCHANTERIC Right 05/31/2017   Procedure: INTRAMEDULLARY (IM) NAIL INTERTROCHANTRIC;  Surgeon: Bjorn Pippin, MD;  Location: [facility] OR;  Service: Orthopedics;  Laterality: Right;   TOTAL SHOULDER ARTHROPLASTY Right 07/20/2019   Procedure: TOTAL SHOULDER ARTHROPLASTY;  Surgeon: Bjorn Pippin, MD;  Location: [facility];  Service: Orthopedics;  Laterality: Right;     reports that she has never smoked. She has never used smokeless tobacco. She reports that she does not drink alcohol and does not use drugs.  Allergies  Allergen Reactions   Atorvastatin Other (See Comments)   Septra [Sulfamethoxazole-Trimethoprim] Diarrhea and Nausea And Vomiting     Family History  Problem Relation Age of Onset   Parkinson's disease Mother    Heart Problems Father     Prior to Admission medications   Medication Sig Start Date End Date Taking? Authorizing Provider  acetaminophen (TYLENOL) 500 MG tablet Take 500 mg by mouth 3 (three) times daily as needed for mild pain or moderate pain.   Yes [provider]  acetaminophen (TYLENOL) 500 MG tablet Take 1,000 mg by mouth 3 (three) times daily as needed (severe pain).   Yes [provider]  cephALEXin (KEFLEX) 500 MG capsule Take 1 capsule (500 mg total) by mouth 4 (four) times daily. 06/25/21  Yes Ernie Avena, MD  Emollient [place]) LOTN Apply 1 application. topically 3 (three) times daily as needed (dry or itchy skin).   Yes [provider]  guaifenesin (ROBITUSSIN) 100 MG/5ML syrup Take 10 mLs by mouth every 4 (four) hours as needed for cough.   Yes [provider]  liver oil-zinc oxide (DESITIN) 40 % ointment Apply 1 application. topically as needed for irritation. 06/25/21  Yes Ernie Avena, MD  Nutritional Supplements (ENSURE NUTRITION SHAKE) LIQD Take 0.5 Bottles by mouth in the morning and at bedtime. Chocolate flavor preferred.   Yes [provider]  nystatin cream (MYCOSTATIN) Apply to affected area 2 times daily Patient taking differently: 1 application. 3 (three) times daily as needed (for redness and rash). Apply topically under breast  and groin area 06/25/21  Yes Ernie Avena, MD  ondansetron (ZOFRAN ODT) 8 MG disintegrating tablet Take 1 tablet (8 mg total) by mouth every 8 (eight) hours as needed for nausea. 10/25/19  Yes Nanavati, Ankit, MD  polyethylene glycol (MIRALAX / GLYCOLAX) 17 g packet Take 17 g by mouth daily.   Yes [provider]  Polyvinyl Alcohol-Povidone (CLEAR EYES NATURAL TEARS) 5-6 MG/ML SOLN Place 1 drop into both eyes at bedtime.   Yes [provider]    Physical Exam: Constitutional: Moderately built  and nourished. Vitals:   06/27/21 0225 06/27/21 0330  BP: (!) 149/66 (!) 143/61  Pulse: (!) 103 95  Resp: 18 (!) 23  Temp: 97.8 F (36.6 C)   TempSrc: Oral   SpO2: 94% 94%   Eyes: Anicteric no pallor. ENMT: Small bruise on the forehead. Neck: No neck rigidity. Respiratory: No rhonchi or crepitations. Cardiovascular: S1-S2 heard. Abdomen: Soft nontender bowel sound present. Musculoskeletal: Pain on moving left hip. Skin: Small bruise on the head. Neurologic: Alert awake oriented to name and place.  Moving all extremities. Psychiatric: Oriented to name and place.   Labs on Admission: I have personally reviewed following labs and imaging studies  CBC: Recent Labs  Lab 06/25/21 0859 06/25/21 1131 06/27/21 0236  WBC 10.1  --  17.8*  NEUTROABS 8.1*  --  16.2*  HGB 12.5 13.3 11.8*  HCT 38.3 39.0 35.8*  MCV 98.5  --  96.5  PLT 209  --  176   Basic Metabolic Panel: Recent Labs  Lab 06/25/21 0859 06/25/21 1131 06/27/21 0236  NA 139 147* 135  K 4.2 4.5 3.5  CL 105  --  101  CO2 22  --  25  GLUCOSE 151*  --  168*  BUN 22  --  27*  CREATININE 1.20*  --  0.89  CALCIUM 9.3  --  9.3   GFR: CrCl cannot be calculated (Unknown ideal weight.). Liver Function Tests: Recent Labs  Lab 06/25/21 0859 06/27/21 0236  AST 42* 67*  ALT 14 24  ALKPHOS 75 82  BILITOT 0.6 0.7  PROT 7.0 7.9  ALBUMIN 3.6 4.1   No results for input(s): LIPASE, AMYLASE in the last 168 hours. Recent Labs  Lab 06/25/21 1012  AMMONIA 19   Coagulation Profile: No results for input(s): INR, PROTIME in the last 168 hours. Cardiac Enzymes: No results for input(s): CKTOTAL, CKMB, CKMBINDEX, TROPONINI in the last 168 hours. BNP (last 3 results) No results for input(s): PROBNP in the last 8760 hours. HbA1C: No results for input(s): HGBA1C in the last 72 hours. CBG: Recent Labs  Lab 06/25/21 1010  GLUCAP 128*   Lipid Profile: No results for input(s): CHOL, HDL, LDLCALC, TRIG, CHOLHDL,  LDLDIRECT in the last 72 hours. Thyroid Function Tests: No results for input(s): TSH, T4TOTAL, FREET4, T3FREE, THYROIDAB in the last 72 hours. Anemia Panel: No results for input(s): VITAMINB12, FOLATE, FERRITIN, TIBC, IRON, RETICCTPCT in the last 72 hours. Urine analysis:    Component Value Date/Time   COLORURINE YELLOW 06/25/2021 1059   APPEARANCEUR CLOUDY (A) 06/25/2021 1059   LABSPEC 1.011 06/25/2021 1059   PHURINE 7.0 06/25/2021 1059   GLUCOSEU NEGATIVE 06/25/2021 1059   HGBUR SMALL (A) 06/25/2021 1059   BILIRUBINUR NEGATIVE 06/25/2021 1059   KETONESUR NEGATIVE 06/25/2021 1059   PROTEINUR 100 (A) 06/25/2021 1059   NITRITE NEGATIVE 06/25/2021 1059   LEUKOCYTESUR LARGE (A) 06/25/2021 1059   Sepsis Labs: @LABRCNTIP (procalcitonin:4,lacticidven:4) ) Recent Results (from the past 240  hour(s))  Blood Cultures (routine x 2)     Status: None (Preliminary result)   Collection Time: 06/25/21  8:59 AM   Specimen: BLOOD  Result Value Ref Range Status   Specimen Description BLOOD BLOOD RIGHT FOREARM  Final   Special Requests   Final    BOTTLES DRAWN AEROBIC AND ANAEROBIC Blood Culture adequate volume   Culture   Final    NO GROWTH 1 DAY Performed at [facility], [address], [place], [place] [address]    Report Status PENDING  Incomplete  Blood Cultures (routine x 2)     Status: None (Preliminary result)   Collection Time: 06/25/21  9:08 AM   Specimen: BLOOD  Result Value Ref Range Status   Specimen Description BLOOD BLOOD RIGHT WRIST  Final   Special Requests   Final    BOTTLES DRAWN AEROBIC AND ANAEROBIC Blood Culture adequate volume   Culture   Final    NO GROWTH 1 DAY Performed at [facility], [address] [place], [place] [address]    Report Status PENDING  Incomplete  Resp Panel by RT-PCR (Flu A&B, Covid)     Status: None   Collection Time: 06/25/21  9:09 AM   Specimen: Nasopharyngeal(NP) swabs in vial transport medium  Result Value Ref Range Status    SARS Coronavirus 2 by RT PCR NEGATIVE NEGATIVE Final    Comment: (NOTE) SARS-CoV-2 target nucleic acids are NOT DETECTED.  The SARS-CoV-2 RNA is generally detectable in upper respiratory specimens during the acute phase of infection. The lowest concentration of SARS-CoV-2 viral copies this assay can detect is 138 copies/mL. A negative result does not preclude SARS-Cov-2 infection and should not be used as the sole basis for treatment or other patient management decisions. A negative result may occur with  improper specimen collection/handling, submission of specimen other than nasopharyngeal swab, presence of viral mutation(s) within the areas targeted by this assay, and inadequate number of viral copies(<138 copies/mL). A negative result must be combined with clinical observations, patient history, and epidemiological information. The expected result is Negative.  Fact Sheet for Patients:  BloggerCourse.com  Fact Sheet for Healthcare Providers:  SeriousBroker.it  This test is no t yet approved or cleared by the [place] FDA and  has been authorized for detection and/or diagnosis of SARS-CoV-2 by FDA under an Emergency Use Authorization (EUA). This EUA will remain  in effect (meaning this test can be used) for the duration of the COVID-19 declaration under Section 564(b)(1) of the Act, 21 U.S.C.section 360bbb-3(b)(1), unless the authorization is terminated  or revoked sooner.       Influenza A by PCR NEGATIVE NEGATIVE Final   Influenza B by PCR NEGATIVE NEGATIVE Final    Comment: (NOTE) The Xpert Xpress SARS-CoV-2/FLU/RSV plus assay is intended as an aid in the diagnosis of influenza from Nasopharyngeal swab specimens and should not be used as a sole basis for treatment. Nasal washings and aspirates are unacceptable for Xpert Xpress SARS-CoV-2/FLU/RSV testing.  Fact Sheet for  Patients: BloggerCourse.com  Fact Sheet for Healthcare Providers: SeriousBroker.it  This test is not yet approved or cleared by the [place] FDA and has been authorized for detection and/or diagnosis of SARS-CoV-2 by FDA under an Emergency Use Authorization (EUA). This EUA will remain in effect (meaning this test can be used) for the duration of the COVID-19 declaration under Section 564(b)(1) of the Act, 21 U.S.C. section 360bbb-3(b)(1), unless the authorization is terminated or revoked.  Performed at [facility], [address], [place], [place] [address]      Radiological Exams on Admission: DG Chest 2 View  Result Date: 06/27/2021 CLINICAL DATA:  Pain after fall out of bed. EXAM: CHEST - 2 VIEW COMPARISON:  10/25/2019 FINDINGS: Shallow inspiration. Heart size and pulmonary vascularity are normal. Emphysematous changes in the lungs. No airspace disease or consolidation. No pleural effusions. No pneumothorax. Postoperative changes in the right shoulder. No acute bony abnormalities are identified. IMPRESSION: Emphysematous changes in the lungs. No evidence of active pulmonary disease. Electronically Signed   By: Burman Nieves M.D.   On: 06/27/2021 03:20   CT Head Wo Contrast  Result Date: 06/27/2021 CLINICAL DATA:  Unwitnessed fall EXAM: CT HEAD WITHOUT CONTRAST CT CERVICAL SPINE WITHOUT CONTRAST TECHNIQUE: Multidetector CT imaging of the head and cervical spine was performed following the standard protocol without intravenous contrast. Multiplanar CT image reconstructions of the cervical spine were also generated. RADIATION DOSE REDUCTION: This exam was performed according to the departmental dose-optimization program which includes automated exposure control, adjustment of the mA and/or kV according to patient size and/or use of iterative reconstruction technique. COMPARISON:  05/30/2017 CT head and cervical spine,  09/04/2020 CT head FINDINGS: CT HEAD FINDINGS Brain: No evidence of acute infarction, hemorrhage, cerebral edema, mass, mass effect, or midline shift. No hydrocephalus or extra-axial fluid collection. Periventricular white matter changes, likely the sequela of chronic small vessel ischemic disease. Grossly unchanged degree of cerebral volume loss. Vascular: No hyperdense vessel. Atherosclerotic calcifications in the intracranial carotid and vertebral arteries. Skull: Normal. Negative for fracture or focal lesion. Sinuses/Orbits: Chronic left maxillary sinusitis. Partial opacification of the ethmoid air cells. Status post bilateral lens replacements. Other: The mastoids are well aerated. CT CERVICAL SPINE FINDINGS Alignment: Normal. Skull base and vertebrae: No acute fracture. No primary bone lesion or focal pathologic process. Soft tissues and spinal canal: No prevertebral fluid or swelling. No visible canal hematoma. Disc levels: Redemonstrated multilevel degenerative changes. Focal central disc protrusion C4-C5, unchanged, which indents the ventral spinal cord and causes mild-to-moderate spinal canal stenosis. Additional small disc bulge at C3-C4, which does not cause significant spinal canal stenosis. Multilevel uncovertebral and facet arthropathy which causes severe neural foraminal narrowing on the left at C5-C6. Upper chest: No focal pulmonary opacity or pleural effusion. Biapical pleural-parenchymal scarring. Other: None. IMPRESSION: 1.  No acute intracranial process. 2.  No acute fracture or traumatic listhesis in the cervical spine. Electronically Signed   By: Wiliam Ke M.D.   On: 06/27/2021 03:25   CT HEAD WO CONTRAST  Result Date: 06/25/2021 CLINICAL DATA:  Mental status change. EXAM: CT HEAD WITHOUT CONTRAST TECHNIQUE: Contiguous axial images were obtained from the base of the skull through the vertex without intravenous contrast. RADIATION DOSE REDUCTION: This exam was performed according to the  departmental dose-optimization program which includes automated exposure control, adjustment of the mA and/or kV according to patient size and/or use of iterative reconstruction technique. COMPARISON:  09/04/2020 FINDINGS: Brain: No evidence of acute infarction, hemorrhage, extra-axial collection, ventriculomegaly, or mass effect. Generalized cerebral atrophy. Periventricular white matter low attenuation likely secondary to microangiopathy. Vascular: Cerebrovascular atherosclerotic calcifications are noted. Skull: Negative for fracture or focal lesion. Sinuses/Orbits: Visualized portions of the orbits are unremarkable. Complete opacification of the left maxillary sinus likely chronic. Visualized portions of the mastoid air cells are unremarkable. Other: None. IMPRESSION: 1. No acute intracranial findings. 2. Chronic left maxillary sinusitis. Electronically Signed   By: Dillard Cannon.D.  On: 06/25/2021 09:51   CT Cervical Spine Wo Contrast  Result Date: 06/27/2021 CLINICAL DATA:  Unwitnessed fall EXAM: CT HEAD WITHOUT CONTRAST CT CERVICAL SPINE WITHOUT CONTRAST TECHNIQUE: Multidetector CT imaging of the head and cervical spine was performed following the standard protocol without intravenous contrast. Multiplanar CT image reconstructions of the cervical spine were also generated. RADIATION DOSE REDUCTION: This exam was performed according to the departmental dose-optimization program which includes automated exposure control, adjustment of the mA and/or kV according to patient size and/or use of iterative reconstruction technique. COMPARISON:  05/30/2017 CT head and cervical spine, 09/04/2020 CT head FINDINGS: CT HEAD FINDINGS Brain: No evidence of acute infarction, hemorrhage, cerebral edema, mass, mass effect, or midline shift. No hydrocephalus or extra-axial fluid collection. Periventricular white matter changes, likely the sequela of chronic small vessel ischemic disease. Grossly unchanged degree of  cerebral volume loss. Vascular: No hyperdense vessel. Atherosclerotic calcifications in the intracranial carotid and vertebral arteries. Skull: Normal. Negative for fracture or focal lesion. Sinuses/Orbits: Chronic left maxillary sinusitis. Partial opacification of the ethmoid air cells. Status post bilateral lens replacements. Other: The mastoids are well aerated. CT CERVICAL SPINE FINDINGS Alignment: Normal. Skull base and vertebrae: No acute fracture. No primary bone lesion or focal pathologic process. Soft tissues and spinal canal: No prevertebral fluid or swelling. No visible canal hematoma. Disc levels: Redemonstrated multilevel degenerative changes. Focal central disc protrusion C4-C5, unchanged, which indents the ventral spinal cord and causes mild-to-moderate spinal canal stenosis. Additional small disc bulge at C3-C4, which does not cause significant spinal canal stenosis. Multilevel uncovertebral and facet arthropathy which causes severe neural foraminal narrowing on the left at C5-C6. Upper chest: No focal pulmonary opacity or pleural effusion. Biapical pleural-parenchymal scarring. Other: None. IMPRESSION: 1.  No acute intracranial process. 2.  No acute fracture or traumatic listhesis in the cervical spine. Electronically Signed   By: Wiliam Ke M.D.   On: 06/27/2021 03:25   DG HIPS BILAT WITH PELVIS 3-4 VIEWS  Result Date: 06/27/2021 CLINICAL DATA:  Pain after fall out of bed. EXAM: DG HIP (WITH OR WITHOUT PELVIS) 3-4V BILAT COMPARISON:  05/30/2017 FINDINGS: Postoperative changes with previous internal fixation of the right hip and femur using long intramedullary rod and compression ball. Degenerative changes are demonstrated in both hips. Cortical irregularity at the left superior pubic ramus suggesting acute fracture. No fracture or dislocation of the left hip. SI joints and symphysis pubis are not displaced. IMPRESSION: 1. Internal fixation of old fracture of the right proximal femur. 2.  Degenerative changes in both hips.  No acute displaced fractures. 3. Acute fracture of the left superior pubic ramus. Electronically Signed   By: Burman Nieves M.D.   On: 06/27/2021 03:22    EKG: Independently reviewed.  Normal sinus rhythm.  Assessment/Plan Principal Problem:   Pubic ramus fracture (HCC) Active Problems:   Leukocytosis   Pelvic fracture (HCC)    Left superior pubic rami fracture after unwitnessed fall -likely nonoperative.  Discussed with orthopedics in the morning.  Get physical therapy consult.  Pain relief medication. Leukocytosis with recent diagnosis of UTI.  Blood cultures have been sent.  UA is pending.  Follow urine cultures.  On ceftriaxone. Anemia follow CBC. History of dementia will need to verify home medicines.  COVID test pending.  Unable to reach family.  Will need to contact again in the morning.   DVT prophylaxis: Heparin. Code Status: DNR. Family Communication: Unable to reach family. Disposition Plan: Back to facility when stable. Consults called:  Physical therapy. Admission status: Observation.   Eduard Clos MD [facility] Pager [phone].  If 7PM-7AM, please contact night-coverage [email or web address] Password [facility]  06/27/2021, 5:15 AM

## 2021-06-27 NOTE — Progress Notes (Signed)
? ? ?  SHORT PROGRESS NOTE ? ?This patient was admitted by my colleague this AM. I have reviewed the chart, examined the patient and discussed the plan with her daughter.  ? ?She was seen in the ED on 3/14. Said to be disoriented at [facility] but then said to be at baseline mentation in the ED with family collaborating this. Found to be dehydrated and found to have a UTI per UA and complaints of dysuria. Given IV Rocephin, prescribed Keflex and dc'd back to ALF.  ? ?Subjective:  ?She is having pain in her lower legs, left upper thigh and head. ? ?Assessment and Plan: ? ? ?Principal Problem: ?  Pubic ramus fracture (HCC) ?- left superior pubic ramus fracture ?- after an unwitnessed fall at ALF ?- having pain even at rest- trying to avoid heave narcotics ?- with PT she is a 2+ assist and per recommendations, will need SNF ?- TOC aware and FL2 done ?- added Motrin for moderate pain and Oxycodone for severe ? ?Active Problems: ?  Leukocytosis, WBC 17 ?- diagnosed with a UTI due to complaints of dysuria on 3/13 in the ED and was started on Keflex  ?- UA showed many bacteria and > 50 WBC but may also have been contaminated- culture shows < 10 colonies  but as she was symptomatic, I plan to treat it for 3 days ?- Given a dose of Ceftriaxone today ?- will go back to Keflex tomorrow ?  ?Dementia ?- lives in ALF ?- not on medications for this ?  ?Recently COVID + ?- remains  + ?- asymptomatic ? ? ? ? ?Debbe Odea, MD ?06/27/2021 ?[facility]  ?

## 2021-06-27 NOTE — NC FL2 (Signed)
?[place] MEDICAID FL2 LEVEL OF CARE SCREENING TOOL  ?  ? ?IDENTIFICATION  ?Patient Name: ?Kimberly Ramirez Birthdate: 09/25/1927 Sex: female Admission Date (Current Location): ?06/27/2021  ?[place] and [place] Number: ? [place] ?  Facility and Address:  ?[facility], [place] [address], [place], [place] [address] ?     Provider Number: ?[id number]  ?Attending Physician Name and Address:  ?Debbe Odea, MD ? Relative Name and Phone Number:  ?Newman,Patrice (Daughter)   [phone] [facility]) ?   ?Current Level of Care: ?Hospital Recommended Level of Care: ?[place] Prior Approval Number: ?[id number] A ? ?Date Approved/Denied: ?  PASRR Number: ?[id number] A ? ?Discharge Plan: ?SNF ?  ? ?Current Diagnoses: ?Patient Active Problem List  ? Diagnosis Date Noted  ? Pubic ramus fracture ([place]) 06/27/2021  ? Pelvic fracture ([place]) 06/27/2021  ? Closed fracture of right proximal humerus 07/20/2019  ? Closed right hip fracture, initial encounter ([place]) 05/30/2017  ? Leukocytosis 05/30/2017  ? Hip fracture ([place]) 05/30/2017  ? Dysuria 05/30/2017  ? TIA (transient ischemic attack) 05/16/2016  ? ? ?Orientation RESPIRATION BLADDER Height & Weight   ?  ?Self, Place ? Normal Incontinent Weight:   ?Height:     ?BEHAVIORAL SYMPTOMS/MOOD NEUROLOGICAL BOWEL NUTRITION STATUS  ?    Incontinent Diet (See d/c summary)  ?AMBULATORY STATUS COMMUNICATION OF NEEDS Skin   ?Extensive Assist Verbally Normal ?  ?  ?  ?    ?     ?     ? ? ?Personal Care Assistance Level of Assistance  ?Feeding, Bathing, Dressing Bathing Assistance: Maximum assistance ?Feeding assistance: Independent ?Dressing Assistance: Maximum assistance ?   ? ?Functional Limitations Info  ?Sight, Hearing, Speech Sight Info: Impaired ?Hearing Info: Adequate ?Speech Info: Adequate  ? ? ?SPECIAL CARE FACTORS FREQUENCY  ?PT (By licensed PT), OT (By licensed OT)   ?  ?PT Frequency: 5x/week ?OT Frequency: 5x/week ?  ?  ?  ?   ? ? ?Contractures Contractures  Info: Not present  ? ? ?Additional Factors Info  ?Code Status, Allergies Code Status Info: DNR ?Allergies Info: Septra (sulfamethoxazole-trimethoprim), Atorvastatin ?  ?  ?  ?   ? ?Current Medications (06/27/2021):  This is the current hospital active medication list ?Current Facility-Administered Medications  ?Medication Dose Route Frequency Provider Last Rate Last Admin  ? acetaminophen (TYLENOL) 160 MG/5ML solution 650 mg  650 mg Oral Q6H PRN Debbe Odea, MD      ? And  ? acetaminophen (TYLENOL) suppository 650 mg  650 mg Rectal Q6H PRN Debbe Odea, MD      ? Derrill Memo ON 06/28/2021] cephALEXin (KEFLEX) capsule 500 mg  500 mg Oral Q12H Rizwan, Saima, MD      ? feeding supplement (ENSURE ENLIVE / ENSURE PLUS) liquid 237 mL  237 mL Oral BID BM Rizwan, Saima, MD   237 mL at 06/27/21 1453  ? heparin injection 5,000 Units  5,000 Units Subcutaneous Q8H Rise Patience, MD   5,000 Units at 06/27/21 0867  ? ibuprofen (ADVIL) tablet 400 mg  400 mg Oral Q4H PRN Debbe Odea, MD   400 mg at 06/27/21 0950  ? oxyCODONE (ROXICODONE) 5 MG/5ML solution 5 mg  5 mg Oral Q6H PRN Debbe Odea, MD      ? ? ? ?Discharge Medications: ?Please see discharge summary for a list of discharge medications. ? ?Relevant Imaging Results: ? ?Relevant Lab Results: ? ? ?Additional Information ?SS#: [phone] Per infection prevention, " Pt current VS/Status not  indicative of Covid. No Airborne/Contact precautions needed currently" ? ?[place], LCSW ? ? ? ? ?

## 2021-06-27 NOTE — Care Management Obs Status (Signed)
MEDICARE OBSERVATION STATUS NOTIFICATION ? ? ?Patient Details  ?Name: Kimberly Ramirez ?MRN: [id number] ?Date of Birth: June 19, 1927 ? ? ?Medicare Observation Status Notification Given:  Yes ? ? ? ?[place], LCSW ?06/27/2021, 3:34 PM ?

## 2021-06-27 NOTE — TOC Initial Note (Addendum)
Transition of Care (TOC) - Initial/Assessment Note  ? ? ?Patient Details  ?Name: Kimberly Ramirez ?MRN: [id number] ?Date of Birth: 08/16/1927 ? ?Transition of Care (TOC) CM/SW Contact:    ?[place], LCSW ?Phone Number: ?06/27/2021, 3:14 PM ? ?Clinical Narrative:                 ? ?CSW met with pt and pt daughter bedside to discuss SNF recommendation. They are agreeable to SNF. Pt has been to [facility] 4 years ago so they are somewhat familiar with process. CSW assists pt daughter in accessing medicare SNF rating list on her phone. Pt currently lives at [facility]. She is fully covid vaccinated. CSW will complete fl2 and fax bed requests in hub.  ? ?CSW notified [facility]; they request update on which SNF when determined.  ? ?Expected Discharge Plan: [place] ?Barriers to Discharge: No SNF bed ? ? ?Patient Goals and CMS Choice ?  ?  ?  ? ?Expected Discharge Plan and Services ?Expected Discharge Plan: [place] ?  ?  ?Post Acute Care Choice: [place] ?Living arrangements for the past 2 months: [place] ?                ?  ?  ?  ?  ?  ?  ?  ?  ?  ?  ? ?Prior Living Arrangements/Services ?Living arrangements for the past 2 months: [place] ?Lives with:: Facility Resident ?  ?       ?  ?  ?  ?  ? ?Activities of Daily Living ?Home Assistive Devices/Equipment: Gilford Rile (specify type) ?ADL Screening (condition at time of admission) ?Patient's cognitive ability adequate to safely complete daily activities?: No ?Is the patient deaf or have difficulty hearing?: No ?Does the patient have difficulty seeing, even when wearing glasses/contacts?: No ?Does the patient have difficulty concentrating, remembering, or making decisions?: Yes ?Patient able to express need for assistance with ADLs?: No ?Does the patient have difficulty dressing or bathing?: Yes ?Independently performs ADLs?: No ?Communication: Needs assistance ?Is this a change from  baseline?: Pre-admission baseline ?Dressing (OT): Needs assistance ?Is this a change from baseline?: Pre-admission baseline ?Grooming: Needs assistance ?Is this a change from baseline?: Pre-admission baseline ?Feeding: Needs assistance ?Is this a change from baseline?: Pre-admission baseline ?Bathing: Needs assistance ?Is this a change from baseline?: Pre-admission baseline ?Toileting: Needs assistance ?Is this a change from baseline?: Pre-admission baseline ?In/Out Bed: Needs assistance ?Is this a change from baseline?: Pre-admission baseline ?Walks in Home: Needs assistance ?Is this a change from baseline?: Pre-admission baseline ?Does the patient have difficulty walking or climbing stairs?: Yes ?Weakness of Legs: Both ?Weakness of Arms/Hands: Both ? ?Permission Sought/Granted ?  ?  ?   ?   ?   ?   ? ?Emotional Assessment ?  ?  ?  ?Orientation: : Oriented to Self, Oriented to Place ?Alcohol / Substance Use: Not Applicable ?Psych Involvement: No (comment) ? ?Admission diagnosis:  Pelvic fracture ([place]) [S32.9XXA] ?Fall [W19.XXXA] ?Pubic ramus fracture ([place]) [S32.599A] ?Injury of head, initial encounter [S09.90XA] ?Fall, initial encounter [W19.XXXA] ?Closed fracture of ramus of left pubis, initial encounter ([place]) [S32.592A] ?Patient Active Problem List  ? Diagnosis Date Noted  ? Pubic ramus fracture ([place]) 06/27/2021  ? Pelvic fracture ([place]) 06/27/2021  ? Closed fracture of right proximal humerus 07/20/2019  ? Closed right hip fracture, initial encounter ([place]) 05/30/2017  ? Leukocytosis 05/30/2017  ? Hip fracture ([place]) 05/30/2017  ?  Dysuria 05/30/2017  ? TIA (transient ischemic attack) 05/16/2016  ? ?PCP:  Lajean Manes, MD ?Pharmacy:   ?[facility] [address] [facility] [place] ?[address] ?[place] [address] ?Phone: [phone] Fax: [phone] ? ? ? ? ?Social Determinants of Health (SDOH) Interventions ?  ? ?Readmission Risk Interventions ?No flowsheet  data found. ? ? ?

## 2021-06-28 DIAGNOSIS — I959 Hypotension, unspecified: Secondary | ICD-10-CM | POA: Diagnosis not present

## 2021-06-28 DIAGNOSIS — Z66 Do not resuscitate: Secondary | ICD-10-CM | POA: Diagnosis not present

## 2021-06-28 DIAGNOSIS — F0390 Unspecified dementia without behavioral disturbance: Secondary | ICD-10-CM

## 2021-06-28 DIAGNOSIS — D649 Anemia, unspecified: Secondary | ICD-10-CM | POA: Diagnosis not present

## 2021-06-28 DIAGNOSIS — M6259 Muscle wasting and atrophy, not elsewhere classified, multiple sites: Secondary | ICD-10-CM | POA: Diagnosis not present

## 2021-06-28 DIAGNOSIS — R1310 Dysphagia, unspecified: Secondary | ICD-10-CM | POA: Diagnosis not present

## 2021-06-28 DIAGNOSIS — R41841 Cognitive communication deficit: Secondary | ICD-10-CM | POA: Diagnosis not present

## 2021-06-28 DIAGNOSIS — S32592A Other specified fracture of left pubis, initial encounter for closed fracture: Secondary | ICD-10-CM | POA: Diagnosis not present

## 2021-06-28 DIAGNOSIS — S0990XD Unspecified injury of head, subsequent encounter: Secondary | ICD-10-CM | POA: Diagnosis not present

## 2021-06-28 DIAGNOSIS — F0280 Dementia in other diseases classified elsewhere without behavioral disturbance: Secondary | ICD-10-CM | POA: Diagnosis not present

## 2021-06-28 DIAGNOSIS — S32592G Other specified fracture of left pubis, subsequent encounter for fracture with delayed healing: Secondary | ICD-10-CM

## 2021-06-28 DIAGNOSIS — Z20822 Contact with and (suspected) exposure to covid-19: Secondary | ICD-10-CM | POA: Diagnosis not present

## 2021-06-28 DIAGNOSIS — D72829 Elevated white blood cell count, unspecified: Secondary | ICD-10-CM | POA: Diagnosis not present

## 2021-06-28 DIAGNOSIS — W19XXXA Unspecified fall, initial encounter: Secondary | ICD-10-CM | POA: Diagnosis not present

## 2021-06-28 DIAGNOSIS — R262 Difficulty in walking, not elsewhere classified: Secondary | ICD-10-CM | POA: Diagnosis not present

## 2021-06-28 DIAGNOSIS — R2689 Other abnormalities of gait and mobility: Secondary | ICD-10-CM | POA: Diagnosis not present

## 2021-06-28 DIAGNOSIS — B372 Candidiasis of skin and nail: Secondary | ICD-10-CM | POA: Diagnosis not present

## 2021-06-28 DIAGNOSIS — S32592S Other specified fracture of left pubis, sequela: Secondary | ICD-10-CM | POA: Diagnosis not present

## 2021-06-28 DIAGNOSIS — S32592D Other specified fracture of left pubis, subsequent encounter for fracture with routine healing: Secondary | ICD-10-CM | POA: Diagnosis not present

## 2021-06-28 DIAGNOSIS — F03C0 Unspecified dementia, severe, without behavioral disturbance, psychotic disturbance, mood disturbance, and anxiety: Secondary | ICD-10-CM | POA: Diagnosis not present

## 2021-06-28 DIAGNOSIS — G309 Alzheimer's disease, unspecified: Secondary | ICD-10-CM | POA: Diagnosis not present

## 2021-06-28 DIAGNOSIS — U071 COVID-19: Secondary | ICD-10-CM | POA: Diagnosis not present

## 2021-06-28 DIAGNOSIS — Z8616 Personal history of COVID-19: Secondary | ICD-10-CM | POA: Diagnosis not present

## 2021-06-28 DIAGNOSIS — Z7401 Bed confinement status: Secondary | ICD-10-CM | POA: Diagnosis not present

## 2021-06-28 DIAGNOSIS — Z741 Need for assistance with personal care: Secondary | ICD-10-CM | POA: Diagnosis not present

## 2021-06-28 DIAGNOSIS — M6281 Muscle weakness (generalized): Secondary | ICD-10-CM | POA: Diagnosis not present

## 2021-06-28 DIAGNOSIS — N390 Urinary tract infection, site not specified: Secondary | ICD-10-CM | POA: Diagnosis not present

## 2021-06-28 LAB — CBC
HCT: 33.2 % — ABNORMAL LOW (ref 36.0–46.0)
Hemoglobin: 11 g/dL — ABNORMAL LOW (ref 12.0–15.0)
MCH: 31.7 pg (ref 26.0–34.0)
MCHC: 33.1 g/dL (ref 30.0–36.0)
MCV: 95.7 fL (ref 80.0–100.0)
Platelets: 128 10*3/uL — ABNORMAL LOW (ref 150–400)
RBC: 3.47 MIL/uL — ABNORMAL LOW (ref 3.87–5.11)
RDW: 12.7 % (ref 11.5–15.5)
WBC: 7.9 10*3/uL (ref 4.0–10.5)
nRBC: 0 % (ref 0.0–0.2)

## 2021-06-28 LAB — BASIC METABOLIC PANEL
Anion gap: 8 (ref 5–15)
BUN: 23 mg/dL (ref 8–23)
CO2: 24 mmol/L (ref 22–32)
Calcium: 8.3 mg/dL — ABNORMAL LOW (ref 8.9–10.3)
Chloride: 103 mmol/L (ref 98–111)
Creatinine, Ser: 0.79 mg/dL (ref 0.44–1.00)
GFR, Estimated: >60 mL/min (ref >60)
Glucose, Bld: 93 mg/dL (ref 70–99)
Potassium: 3.6 mmol/L (ref 3.5–5.1)
Sodium: 135 mmol/L (ref 135–145)

## 2021-06-28 MED ORDER — IBUPROFEN 100 MG/5ML PO SUSP: 400.0000 mg | Freq: Four times a day (QID) | ORAL | Status: DC | PRN

## 2021-06-28 MED ORDER — OXYCODONE HCL 5 MG/5ML PO SOLN: 5.0000 mg | Freq: Four times a day (QID) | ORAL | 0 refills | Status: DC | PRN

## 2021-06-28 MED ORDER — SENNA 8.6 MG PO TABS: 1.0000 | ORAL_TABLET | Freq: Every day | ORAL | 0 refills | Status: AC | PRN

## 2021-06-28 MED ORDER — POLYETHYLENE GLYCOL 3350 17 G PO PACK: 17.0000 g | PACK | Freq: Every day | ORAL | Status: DC | PRN

## 2021-06-28 MED ORDER — SENNA 8.6 MG PO TABS: 1.0000 | ORAL_TABLET | Freq: Every day | ORAL | Status: DC | PRN

## 2021-06-28 MED ORDER — CEPHALEXIN 500 MG PO CAPS
500.0000 mg | ORAL_CAPSULE | Freq: Two times a day (BID) | ORAL | Status: DC
Start: 2021-06-28 — End: 2021-07-15

## 2021-06-28 NOTE — Progress Notes (Signed)
Pt discharged via transport services. VS at baseline. Discharge package given to [facility] transporter.  ? ? ? ?

## 2021-06-28 NOTE — Progress Notes (Signed)
Report called to RN at [facility].  ?

## 2021-06-28 NOTE — Progress Notes (Signed)
Patient had a large wet pad as well as 30 ml in purewick canister ?

## 2021-06-28 NOTE — Discharge Summary (Signed)
Physician Discharge Summary  ?Kimberly Ramirez YBO:[id number] DOB: Jan 20, 1928 DOA: 06/27/2021 ? ?PCP: Lajean Manes, MD ? ?Admit date: 06/27/2021 ?Discharge date: 06/28/2021 ?Discharging to: SNF ?Recommendations for Outpatient Follow-up:  ?Keflex can be discontinued tomorrow evening. ?Follow for constipation related to pain medication and immobility ?Cont Incentive spirometry if able ? ?Consults:  ?none ?Procedures:  ?none ? ? ?Discharge Diagnoses:  ? Principal Problem: ?  Pubic ramus fracture, left, closed, initial encounter ([place]) ?Active Problems: ?  Leukocytosis ?  UTI (urinary tract infection) ?  Dementia without behavioral disturbance ([place]) ? ? ? ? ?Hospital Course: ?Kimberly Ramirez is a 88 y.o. female with history of TIA, dementia was brought to the ER after patient had a unwitnessed fall at ALF. She is found to have a pubic ramus fracture.  ? ?She was seen in the ED on 3/14. Said to be disoriented at [facility] but then said to be at baseline mentation in the ED with family collaborating this. Found to have a UTI per UA and complaints of dysuria. Given IV Rocephin, prescribed Keflex and dc'd back to ALF.  ?  ?  ? ?Assessment and Plan: ?Principal Problem: ?  Pubic ramus fracture (HCC), hematoma on right forehead ?- left superior pubic ramus fracture ?- after an unwitnessed fall at ALF ?- having pain even at rest- trying to avoid heave narcotics ?- with PT she is a 2+ assist and per recommendations, will need SNF ?- TOC aware and FL2 done ?- added Motrin for moderate pain and Oxycodone for severe ?  ?Active Problems: ?  Leukocytosis, WBC 17 ?- diagnosed with a UTI due to complaints of dysuria on 3/13 in the ED and was started on Keflex  ?- UA showed many bacteria and > 50 WBC but may also have been contaminated- culture shows < 10 colonies  but as she was symptomatic, I plan to treat it for 3 days ?- Given a dose of Ceftriaxone yesterday and today ?  ?Dementia ?- lives in ALF ?- not on medications for this ?  ?Recently COVID  + ?- remains  + ?- asymptomatic ? ?  ? ?Discharge Instructions ? ?Discharge Instructions   ? ? Increase activity slowly   Complete by: As directed ?  ? ?  ? ?Allergies as of 06/28/2021   ? ?   Reactions  ? Atorvastatin Other (See Comments)  ? Septra [sulfamethoxazole-trimethoprim] Diarrhea, Nausea And Vomiting  ? ?  ? ?  ?Medication List  ?  ? ?TAKE these medications   ? ?acetaminophen 500 MG tablet ?Commonly known as: TYLENOL ?Take 500 mg by mouth 3 (three) times daily as needed for mild pain or moderate pain. ?What changed: Another medication with the same name was removed. Continue taking this medication, and follow the directions you see here. ?  ?cephALEXin 500 MG capsule ?Commonly known as: KEFLEX ?Take 1 capsule (500 mg total) by mouth every 12 (twelve) hours. ?What changed: when to take this ?  ?Clear Eyes Natural Tears 5-6 MG/ML Soln ?Generic drug: Polyvinyl Alcohol-Povidone ?Place 1 drop into both eyes at bedtime. ?  ?Ensure Nutrition Shake Liqd ?Take 0.5 Bottles by mouth in the morning and at bedtime. Chocolate flavor preferred. ?  ?guaifenesin 100 MG/5ML syrup ?Commonly known as: ROBITUSSIN ?Take 10 mLs by mouth every 4 (four) hours as needed for cough. ?  ?ibuprofen 100 MG/5ML suspension ?Commonly known as: ADVIL ?Take 20 mLs (400 mg total) by mouth every 6 (six) hours as needed for moderate pain. ?  ?liver oil-zinc  oxide 40 % ointment ?Commonly known as: DESITIN ?Apply 1 application. topically as needed for irritation. ?  ?Lubriderm Lotn ?Apply 1 application. topically 3 (three) times daily as needed (dry or itchy skin). ?  ?nystatin cream ?Commonly known as: MYCOSTATIN ?Apply to affected area 2 times daily ?What changed:  ?how much to take ?when to take this ?reasons to take this ?additional instructions ?  ?ondansetron 8 MG disintegrating tablet ?Commonly known as: Zofran ODT ?Take 1 tablet (8 mg total) by mouth every 8 (eight) hours as needed for nausea. ?  ?oxyCODONE 5 MG/5ML solution ?Commonly  known as: ROXICODONE ?Take 5 mLs (5 mg total) by mouth every 6 (six) hours as needed for severe pain. ?  ?polyethylene glycol 17 g packet ?Commonly known as: MIRALAX / GLYCOLAX ?Take 17 g by mouth daily. ?  ?senna 8.6 MG Tabs tablet ?Commonly known as: SENOKOT ?Take 1 tablet (8.6 mg total) by mouth daily as needed for mild constipation. ?  ? ?  ? ? ?  ?  ?The results of significant diagnostics from this hospitalization (including imaging, microbiology, ancillary and laboratory) are listed below for reference.   ? ?DG Chest 2 View ? ?Result Date: 06/27/2021 ?CLINICAL DATA:  Pain after fall out of bed. EXAM: CHEST - 2 VIEW COMPARISON:  10/25/2019 FINDINGS: Shallow inspiration. Heart size and pulmonary vascularity are normal. Emphysematous changes in the lungs. No airspace disease or consolidation. No pleural effusions. No pneumothorax. Postoperative changes in the right shoulder. No acute bony abnormalities are identified. IMPRESSION: Emphysematous changes in the lungs. No evidence of active pulmonary disease. Electronically Signed   By: Lucienne Capers M.D.   On: 06/27/2021 03:20  ? ?CT Head Wo Contrast ? ?Result Date: 06/27/2021 ?CLINICAL DATA:  Unwitnessed fall EXAM: CT HEAD WITHOUT CONTRAST CT CERVICAL SPINE WITHOUT CONTRAST TECHNIQUE: Multidetector CT imaging of the head and cervical spine was performed following the standard protocol without intravenous contrast. Multiplanar CT image reconstructions of the cervical spine were also generated. RADIATION DOSE REDUCTION: This exam was performed according to the departmental dose-optimization program which includes automated exposure control, adjustment of the mA and/or kV according to patient size and/or use of iterative reconstruction technique. COMPARISON:  05/30/2017 CT head and cervical spine, 09/04/2020 CT head FINDINGS: CT HEAD FINDINGS Brain: No evidence of acute infarction, hemorrhage, cerebral edema, mass, mass effect, or midline shift. No hydrocephalus or  extra-axial fluid collection. Periventricular white matter changes, likely the sequela of chronic small vessel ischemic disease. Grossly unchanged degree of cerebral volume loss. Vascular: No hyperdense vessel. Atherosclerotic calcifications in the intracranial carotid and vertebral arteries. Skull: Normal. Negative for fracture or focal lesion. Sinuses/Orbits: Chronic left maxillary sinusitis. Partial opacification of the ethmoid air cells. Status post bilateral lens replacements. Other: The mastoids are well aerated. CT CERVICAL SPINE FINDINGS Alignment: Normal. Skull base and vertebrae: No acute fracture. No primary bone lesion or focal pathologic process. Soft tissues and spinal canal: No prevertebral fluid or swelling. No visible canal hematoma. Disc levels: Redemonstrated multilevel degenerative changes. Focal central disc protrusion C4-C5, unchanged, which indents the ventral spinal cord and causes mild-to-moderate spinal canal stenosis. Additional small disc bulge at C3-C4, which does not cause significant spinal canal stenosis. Multilevel uncovertebral and facet arthropathy which causes severe neural foraminal narrowing on the left at C5-C6. Upper chest: No focal pulmonary opacity or pleural effusion. Biapical pleural-parenchymal scarring. Other: None. IMPRESSION: 1.  No acute intracranial process. 2.  No acute fracture or traumatic listhesis in the cervical spine. Electronically  Signed   By: Merilyn Baba M.D.   On: 06/27/2021 03:25  ? ?CT HEAD WO CONTRAST ? ?Result Date: 06/25/2021 ?CLINICAL DATA:  Mental status change. EXAM: CT HEAD WITHOUT CONTRAST TECHNIQUE: Contiguous axial images were obtained from the base of the skull through the vertex without intravenous contrast. RADIATION DOSE REDUCTION: This exam was performed according to the departmental dose-optimization program which includes automated exposure control, adjustment of the mA and/or kV according to patient size and/or use of iterative  reconstruction technique. COMPARISON:  09/04/2020 FINDINGS: Brain: No evidence of acute infarction, hemorrhage, extra-axial collection, ventriculomegaly, or mass effect. Generalized cerebral atrophy. Periventricular

## 2021-06-28 NOTE — TOC Transition Note (Addendum)
Transition of Care (TOC) - CM/SW Discharge Note ? ? ?Patient Details  ?Name: Kimberly Ramirez ?MRN: [id number] ?Date of Birth: May 17, 1927 ? ?Transition of Care (TOC) CM/SW Contact:  ?Ross Ludwig, LCSW ?Phone Number: ?06/28/2021, 12:31 PM ? ? ?Clinical Narrative:    ? ?CSW presented bed offers, and family chose [place].  Patient to be d/c'ed today to [facility] room 116.  Patient and family agreeable to plans will transport via ems RN to call report to [phone].  Patient's family is aware that she is discharging today. ? ? ? ? ?Final next level of care: [place] ?Barriers to Discharge: Barriers Resolved ? ? ?Patient Goals and CMS Choice ?Patient states their goals for this hospitalization and ongoing recovery are:: To return to [facility] after rehab. ?CMS Medicare.gov Compare Post Acute Care list provided to:: Patient Represenative (must comment) ?Choice offered to / list presented to : Adult Children ? ?Discharge Placement ?PASRR number recieved: 06/27/21 ?           ?Patient chooses bed at: [facility] ?Patient to be transferred to facility by: [facility] ?Name of family member notified: Daughter Patrices ?Patient and family notified of of transfer: 06/28/21 ? ?Discharge Plan and Services ?  ?  ?Post Acute Care Choice: [place]          ?  ?  ?  ?  ?  ?  ?  ?  ?  ?  ? ?Social Determinants of Health (SDOH) Interventions ?  ? ? ?Readmission Risk Interventions ?No flowsheet data found. ? ? ? ? ?

## 2021-06-29 LAB — URINE CULTURE: Culture: NO GROWTH

## 2021-06-30 LAB — CULTURE, BLOOD (ROUTINE X 2)
Culture: NO GROWTH
Culture: NO GROWTH
Special Requests: ADEQUATE
Special Requests: ADEQUATE

## 2021-07-01 ENCOUNTER — Non-Acute Institutional Stay (SKILLED_NURSING_FACILITY): Payer: Medicare Other | Admitting: Adult Health

## 2021-07-01 ENCOUNTER — Encounter: Payer: Self-pay | Admitting: Adult Health

## 2021-07-01 DIAGNOSIS — N390 Urinary tract infection, site not specified: Secondary | ICD-10-CM | POA: Diagnosis not present

## 2021-07-01 DIAGNOSIS — U071 COVID-19: Secondary | ICD-10-CM

## 2021-07-01 DIAGNOSIS — S32592S Other specified fracture of left pubis, sequela: Secondary | ICD-10-CM

## 2021-07-01 DIAGNOSIS — F0390 Unspecified dementia without behavioral disturbance: Secondary | ICD-10-CM | POA: Diagnosis not present

## 2021-07-01 MED ORDER — OXYCODONE HCL 5 MG/5ML PO SOLN: 5.0000 mg | Freq: Four times a day (QID) | ORAL | 0 refills | Status: DC | PRN

## 2021-07-01 NOTE — Progress Notes (Signed)
? ?Location:  [facility] ?Nursing Home Room Number: 116-A ?Place of Service:  SNF (31) ?Provider:  Durenda Age, DNP, FNP-BC ? ?Patient Care Team: ?Lajean Manes, MD as PCP - General (Internal Medicine) ?Alphonsa Overall, MD as Consulting Physician (General Surgery) ? ?Extended Emergency Contact Information ?Primary Emergency Contact: Newman,Patrice ?Address: [address] ?         [place] [address] [place] ?Home Phone: [phone] ?Mobile Phone: [phone] ?Relation: Daughter ?Secondary Emergency Contact: Lucia Gaskins,  Dr. Shanon Brow ?Mobile Phone: [phone] ?Relation: Son ? ?Code Status:  DNR ? ?Goals of care: Advanced Directive information ?Advanced Directives 07/01/2021  ?Does Patient Have a Medical Advance Directive? Yes  ?Type of Advance Directive Out of facility DNR (pink MOST or yellow form)  ?Does patient want to make changes to medical advance directive? No - Patient declined  ?Copy of [place] in Chart? -  ?Would patient like information on creating a medical advance directive? -  ?Pre-existing out of facility DNR order (yellow form or pink MOST form) Pink MOST/Yellow Form most recent copy in chart - Physician notified to receive inpatient order  ? ? ? ?Chief Complaint  ?Patient presents with  ? Hospitalization Follow-up  ? ? ?HPI:  ?Pt is a 88 y.o. female who was admitted to [facility] on  06/28/21 post hospital admission 06/27/21 to 06/28/21.  She has a PMH of TIA and dementia.  She lives in at  [facility] wherein she had an unwitnessed fall and complained of back pain.  She was brought to ED and was found to have a pubic ramus fracture and hematoma to right forehead.  WBC was noted to be elevated 17 and was thought to be due to UTI.  She was given ceftriaxone x2 days. Incidentally, she tested positive for COVID-19 but was asymptomatic. She has 3 Moderna COVID-19 vaccines. ? ?She was recently seen in the ED 3 days prior to  admission when she was having increased confusion and was diagnosed with UTI and was prescribed Keflex. ? ?She was seen in the room today with daughter at bedside. Patient was just given Oxycodone 5 mg and was sleepy but verbally responsive. ? ? ?Past Medical History:  ?Diagnosis Date  ? Alzheimer disease ([place])   ? Closed right hip fracture ([place]) 05/2017  ? Hypertension   ? TIA (transient ischemic attack) 2019  ? ?Past Surgical History:  ?Procedure Laterality Date  ? ABDOMINAL HYSTERECTOMY    ? FEMUR IM NAIL Right 05/31/2017  ? FRACTURE SURGERY    ? left leg  ? INTRAMEDULLARY (IM) NAIL INTERTROCHANTERIC Right 05/31/2017  ? Procedure: INTRAMEDULLARY (IM) NAIL INTERTROCHANTRIC;  Surgeon: Hiram Gash, MD;  Location: [facility];  Service: Orthopedics;  Laterality: Right;  ? TOTAL SHOULDER ARTHROPLASTY Right 07/20/2019  ? Procedure: TOTAL SHOULDER ARTHROPLASTY;  Surgeon: Hiram Gash, MD;  Location: [facility];  Service: Orthopedics;  Laterality: Right;  ? ? ?Allergies  ?Allergen Reactions  ? Atorvastatin Other (See Comments)  ? Septra [Sulfamethoxazole-Trimethoprim] Diarrhea and Nausea And Vomiting  ? ? ?Outpatient Encounter Medications as of 07/01/2021  ?Medication Sig  ? acetaminophen (TYLENOL) 500 MG tablet Take 500 mg by mouth 3 (three) times daily as needed for mild pain or moderate pain.  ? bisacodyl (DULCOLAX) 10 MG suppository If not relieved by MOM, give 10 mg Bisacodyl suppositiory rectally X 1 dose in 24 hours as needed  ? cephALEXin (KEFLEX) 500 MG capsule Take 1 capsule (500 mg total) by mouth  every 12 (twelve) hours.  ? Emollient (LUBRIDERM) LOTN Apply 1 application. topically 3 (three) times daily as needed (dry or itchy skin).  ? guaifenesin (ROBITUSSIN) 100 MG/5ML syrup Take 10 mLs by mouth every 4 (four) hours as needed for cough.  ? ibuprofen (ADVIL) 100 MG/5ML suspension Take 20 mLs (400 mg total) by mouth every 6 (six) hours as needed for moderate pain.  ? liver oil-zinc oxide (DESITIN) 40 % ointment Apply 1  application. topically as needed for irritation.  ? Magnesium Hydroxide (MILK OF MAGNESIA PO) Take by mouth. Constipation (1 of 4): If no BM in 3 days, give 30 cc Milk of Magnesium p.o. x 1 dose in 24 hours as needed (Do not use standing constipation orders for residents with renal failure CFR less than 30. Contact MD for orders)  ? Nutritional Supplements (ENSURE NUTRITION SHAKE) LIQD Take 0.5 Bottles by mouth in the morning and at bedtime. Chocolate flavor preferred.  ? nystatin cream (MYCOSTATIN) Apply to affected area 2 times daily  ? ondansetron (ZOFRAN ODT) 8 MG disintegrating tablet Take 1 tablet (8 mg total) by mouth every 8 (eight) hours as needed for nausea.  ? polyethylene glycol (MIRALAX / GLYCOLAX) 17 g packet Take 17 g by mouth daily.  ? Polyvinyl Alcohol-Povidone (CLEAR EYES NATURAL TEARS) 5-6 MG/ML SOLN Place 1 drop into both eyes at bedtime.  ? senna (SENOKOT) 8.6 MG TABS tablet Take 1 tablet (8.6 mg total) by mouth daily as needed for mild constipation.  ? Sodium Phosphates (RA SALINE ENEMA RE) If not relieved by Biscodyl suppository, give disposable Saline Enema rectally X 1 dose/24 hrs as needed  ? [DISCONTINUED] oxyCODONE (ROXICODONE) 5 MG/5ML solution Take 5 mLs (5 mg total) by mouth every 6 (six) hours as needed for severe pain.  ? oxyCODONE (ROXICODONE) 5 MG/5ML solution Take 5 mLs (5 mg total) by mouth every 6 (six) hours as needed for severe pain.  ? ?No facility-administered encounter medications on file as of 07/01/2021.  ? ? ?Review of Systems  Unable to obtain due to dementia ? ? ? ?Immunization History  ?Administered Date(s) Administered  ? Influenza, High Dose Seasonal PF 01/20/2018  ? Zoster Recombinat (Shingrix) 01/20/2018, 04/01/2018  ? ?Pertinent  Health Maintenance Due  ?Topic Date Due  ? DEXA SCAN  Never done  ? INFLUENZA VACCINE  Completed  ? ?Fall Risk 06/27/2021 06/27/2021 06/27/2021 06/28/2021 06/28/2021  ?Patient Fall Risk Level High fall risk High fall risk High fall risk High  fall risk High fall risk  ? ? ? ?Vitals:  ? 07/01/21 0948  ?BP: 122/74  ?Pulse: 72  ?Resp: 20  ?Temp: (!) 97.3 ?F (36.3 ?C)  ?Weight: 120 lb 12.8 oz (54.8 kg)  ?Height: '5\' 2"'$  (1.575 m)  ? ?Body mass index is 22.09 kg/m?. ? ?Physical Exam ?Constitutional:   ?   General: She is not in acute distress. ?   Appearance: Normal appearance.  ?HENT:  ?   Head: Normocephalic and atraumatic.  ?   Nose: Nose normal.  ?   Mouth/Throat:  ?   Mouth: Mucous membranes are moist.  ?Eyes:  ?   Conjunctiva/sclera: Conjunctivae normal.  ?Cardiovascular:  ?   Rate and Rhythm: Normal rate and regular rhythm.  ?Pulmonary:  ?   Effort: Pulmonary effort is normal.  ?   Breath sounds: Normal breath sounds.  ?Abdominal:  ?   General: Bowel sounds are normal.  ?   Palpations: Abdomen is soft.  ?Musculoskeletal:  ?  Cervical back: Normal range of motion.  ?Skin: ?   General: Skin is warm and dry.  ?   Comments: Hematoma to right forehead.  ?Neurological:  ?   General: No focal deficit present.  ?   Mental Status: She is disoriented.  ?   Comments: Alert to self, disoriented to time and place.  ?Psychiatric:     ?   Mood and Affect: Mood normal.     ?   Behavior: Behavior normal.  ?  ? ? ? ?Labs reviewed: ?Recent Labs  ?  06/25/21 ?0859 06/25/21 ?1131 06/27/21 ?0236 06/28/21 ?8756  ?NA 139 147* 135 135  ?K 4.2 4.5 3.5 3.6  ?CL 105  --  101 103  ?CO2 22  --  25 24  ?GLUCOSE 151*  --  168* 93  ?BUN 22  --  27* 23  ?CREATININE 1.20*  --  0.89 0.79  ?CALCIUM 9.3  --  9.3 8.3*  ? ?Recent Labs  ?  09/04/20 ?2047 06/25/21 ?0859 06/27/21 ?0236  ?AST 50* 42* 67*  ?ALT '17 14 24  '$ ?ALKPHOS 86 75 82  ?BILITOT 0.5 0.6 0.7  ?PROT 8.7* 7.0 7.9  ?ALBUMIN 4.6 3.6 4.1  ? ?Recent Labs  ?  09/04/20 ?2047 06/25/21 ?0859 06/25/21 ?1131 06/27/21 ?0236 06/28/21 ?4332  ?WBC 10.0 10.1  --  17.8* 7.9  ?NEUTROABS 7.9* 8.1*  --  16.2*  --   ?HGB 13.3 12.5 13.3 11.8* 11.0*  ?HCT 41.4 38.3 39.0 35.8* 33.2*  ?MCV 99.0 98.5  --  96.5 95.7  ?PLT 210 209  --  176 128*  ? ?No  results found for: TSH ?Lab Results  ?Component Value Date  ? HGBA1C 5.4 05/17/2016  ? ?Lab Results  ?Component Value Date  ? CHOL 276 (H) 05/17/2016  ? HDL 56 05/17/2016  ? LDLCALC 186 (H) 05/17/2016  ? TRIG 172 (

## 2021-07-02 ENCOUNTER — Non-Acute Institutional Stay (SKILLED_NURSING_FACILITY): Payer: Medicare Other | Admitting: Internal Medicine

## 2021-07-02 ENCOUNTER — Encounter: Payer: Self-pay | Admitting: Internal Medicine

## 2021-07-02 DIAGNOSIS — F0390 Unspecified dementia without behavioral disturbance: Secondary | ICD-10-CM | POA: Diagnosis not present

## 2021-07-02 DIAGNOSIS — N390 Urinary tract infection, site not specified: Secondary | ICD-10-CM

## 2021-07-02 DIAGNOSIS — S32592G Other specified fracture of left pubis, subsequent encounter for fracture with delayed healing: Secondary | ICD-10-CM

## 2021-07-02 LAB — CULTURE, BLOOD (ROUTINE X 2)
Culture: NO GROWTH
Culture: NO GROWTH
Special Requests: ADEQUATE

## 2021-07-02 NOTE — Assessment & Plan Note (Addendum)
Keflex ordered at discharge has been completed.  If UTI clinically recurs; in & out cath will be performed to guarantee adequate sample for culture. ?

## 2021-07-02 NOTE — Patient Instructions (Signed)
See assessment and plan under each diagnosis in the problem list and acutely for this visit 

## 2021-07-02 NOTE — Assessment & Plan Note (Addendum)
Agents such as Namenda or Aricept are not indicated.  To date no reported behavioral changes at the SNF beyond confabulation. ?

## 2021-07-02 NOTE — Assessment & Plan Note (Signed)
Discharged to SNF for rehab as dementia will allow. ? ?
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
## 2021-07-05 ENCOUNTER — Other Ambulatory Visit: Payer: Self-pay | Admitting: *Deleted

## 2021-07-05 NOTE — Patient Outreach (Signed)
Per [place] eligible member currently resides in [facility].  Screening for potential care coordination/care management needs post SNF.  ? ?Member's PCP at [facility] has [facility] care management services. ? ?Kimberly Ramirez admitted to SNF on 06/28/21 after hospitalization. ? ?Noted Kimberly Ramirez resided in [place] prior to admission. It does not appear there any identifiable [facility] needs at this time.  ? ? ?Kimberly Rolling, MSN, RN,BSN ?[facility] Coordinator ?[phone] [facility]) ?[phone]  (Toll free office)  ? ? ? ? ?  ?

## 2021-07-09 ENCOUNTER — Non-Acute Institutional Stay (SKILLED_NURSING_FACILITY): Payer: Medicare Other | Admitting: Internal Medicine

## 2021-07-09 ENCOUNTER — Encounter: Payer: Self-pay | Admitting: Internal Medicine

## 2021-07-09 DIAGNOSIS — F0390 Unspecified dementia without behavioral disturbance: Secondary | ICD-10-CM

## 2021-07-09 DIAGNOSIS — S32592G Other specified fracture of left pubis, subsequent encounter for fracture with delayed healing: Secondary | ICD-10-CM | POA: Diagnosis not present

## 2021-07-09 NOTE — Assessment & Plan Note (Addendum)
Today she hallucinated that the NP student was her daughter Sharl Ma.  ?Simply to be thorough; B12, RPR, and TSH will be ordered. ?Duloxetine 30 mg will be added to see if this is of benefit. ?

## 2021-07-09 NOTE — Progress Notes (Signed)
? ?  NURSING HOME LOCATION:  [place] ?ROOM NUMBER:  116 ? ?CODE STATUS:  DNR ? ?PCP: Lajean Manes, MD ? ?This is a nursing facility follow up visit for specific acute issue of PT/OT concern that pain has not been adequately addressed. ?  ?Interim medical record and care since last SNF visit was updated with review of diagnostic studies and change in clinical status since last visit were documented. ? ?HPI: Therapist related that the patient is wincing with range of motion.  The patient has dementia and is unable to communicate pain status. ? ?Review of systems: Dementia invalidated responses.  Her major concern was that "I am sad, I cannot find my children . They cannot find me".  She hallucinated that the nurse practitioner student was her daughter Sharl Ma.  She went on to say that "I was lost"; but she realized she was in her room because she identified the plant on the window ledge.  She was concerned about not being in her home.  She denied pain repeatedly.  She denied any other active symptoms or signs. ? ?Physical exam:  ?Pertinent or positive findings: She appears somewhat chronically ill and suboptimally nourished.  Eyebrows are absent.  She has resolving ecchymosis about the right orbit.  Bilateral ptosis is present laterally.  She has somewhat of a frowning countenance as if worried.  Breath sounds are decreased.  Heart rhythm and rate are slow and regular.  Pedal pulses are decreased.  Limbs are atrophic.  Interosseous wasting is present.  When the lower extremities were elevated and hip range of motion attempted she complained of pain but only in her knees, not the pelvic region. ? ?General appearance:  no acute distress, increased work of breathing is present.   ?Lymphatic: No lymphadenopathy about the head, neck, axilla. ?Eyes: No conjunctival inflammation or lid edema is present. There is no scleral icterus. ?Ears:  External ear exam shows no significant lesions or  deformities.   ?Nose:  External nasal examination shows no deformity or inflammation. Nasal mucosa are pink and moist without lesions, exudates ?Oral exam:  Lips and gums are healthy appearing. There is no oropharyngeal erythema or exudate. ?Neck:  No thyromegaly, masses, tenderness noted.    ?Heart:  No gallop, murmur, click, rub .  ?Lungs:  without wheezes, rhonchi, rales, rubs. ?Abdomen: Bowel sounds are normal. Abdomen is soft and nontender with no organomegaly, hernias, masses. ?GU: Deferred  ?Extremities:  No cyanosis, clubbing, edema  ?Neurologic exam :Balance, Rhomberg, finger to nose testing could not be completed due to clinical state ?Skin: Warm & dry w/o tenting. ?No significant lesions or rash. ? ?See summary under each active problem in the Problem List with associated updated therapeutic plan ? ? ?

## 2021-07-09 NOTE — Assessment & Plan Note (Addendum)
She is unable to communicate whether she is having pain or not.  In fact she denied pain even with range of motion testing of the lower extremities.  Maintenance Tylenol will be changed to 1000 mg every 8 hours.   ?

## 2021-07-09 NOTE — Patient Instructions (Signed)
See assessment and plan under each diagnosis in the problem list and acutely for this visit 

## 2021-07-10 DIAGNOSIS — F03C0 Unspecified dementia, severe, without behavioral disturbance, psychotic disturbance, mood disturbance, and anxiety: Secondary | ICD-10-CM | POA: Diagnosis not present

## 2021-07-10 LAB — TSH: TSH: 8.59 — AB (ref 0.41–5.90)

## 2021-07-10 LAB — VITAMIN B12: Vitamin B-12: 497

## 2021-07-15 ENCOUNTER — Non-Acute Institutional Stay (SKILLED_NURSING_FACILITY): Payer: Medicare Other | Admitting: Adult Health

## 2021-07-15 ENCOUNTER — Encounter: Payer: Self-pay | Admitting: Adult Health

## 2021-07-15 DIAGNOSIS — N390 Urinary tract infection, site not specified: Secondary | ICD-10-CM | POA: Diagnosis not present

## 2021-07-15 DIAGNOSIS — D649 Anemia, unspecified: Secondary | ICD-10-CM | POA: Diagnosis not present

## 2021-07-15 DIAGNOSIS — S32592G Other specified fracture of left pubis, subsequent encounter for fracture with delayed healing: Secondary | ICD-10-CM | POA: Diagnosis not present

## 2021-07-15 DIAGNOSIS — F0390 Unspecified dementia without behavioral disturbance: Secondary | ICD-10-CM | POA: Diagnosis not present

## 2021-07-15 NOTE — Progress Notes (Signed)
? ?Location:  [facility] ?Nursing Home Room Number: 116-A ?Place of Service:  SNF (31) ?Provider:  Durenda Age, DNP, FNP-BC ? ?Patient Care Team: ?Lajean Manes, MD as PCP - General (Internal Medicine) ?Alphonsa Overall, MD as Consulting Physician (General Surgery) ? ?Extended Emergency Contact Information ?Primary Emergency Contact: Newman,Patrice ?Address: [address] ?         [place] [address] [place] ?Home Phone: [phone] ?Mobile Phone: [phone] ?Relation: Daughter ?Secondary Emergency Contact: Lucia Gaskins,  Dr. Shanon Brow ?Mobile Phone: [phone] ?Relation: Son ? ?Code Status:  DNR ? ?Goals of care: Advanced Directive information ? ?  07/15/2021  ?  3:08 PM  ?Advanced Directives  ?Does Patient Have a Medical Advance Directive? Yes  ?Type of Advance Directive Out of facility DNR (pink MOST or yellow form)  ?Does patient want to make changes to medical advance directive? No - Patient declined  ?Pre-existing out of facility DNR order (yellow form or pink MOST form) Pink MOST/Yellow Form most recent copy in chart - Physician notified to receive inpatient order  ? ? ? ?Chief Complaint  ?Patient presents with  ? Acute Visit  ?  Short term rehab  ? ? ?HPI:  ?Pt is a 88 y.o. female seen today for short-term rehabilitation visit.  She is currently having PT, OT and ST. ? ?Urinary tract infection without hematuria, site unspecified  - completed antibiotics, resolved ? ?Pubic ramus fracture, left, with delayed healing, subsequent encounter  -  currently having PT and OT ? ?Normocytic anemia  -  hgb  11.0, not on iron supplementation ? ?Dementia without behavioral disturbance ([place])  -  BIMS score 7/15, ranging in severe cognitive impairment ? ? ?Past Medical History:  ?Diagnosis Date  ? Alzheimer disease ([place])   ? Closed right hip fracture ([place]) 05/2017  ? Hypertension   ? TIA (transient ischemic attack) 2019  ? ?Past Surgical History:  ?Procedure Laterality Date  ? ABDOMINAL HYSTERECTOMY     ? FRACTURE SURGERY    ? left leg  ? INTRAMEDULLARY (IM) NAIL INTERTROCHANTERIC Right 05/31/2017  ? Procedure: INTRAMEDULLARY (IM) NAIL INTERTROCHANTRIC;  Surgeon: Hiram Gash, MD;  Location: [facility];  Service: Orthopedics;  Laterality: Right;  ? TOTAL SHOULDER ARTHROPLASTY Right 07/20/2019  ? Procedure: TOTAL SHOULDER ARTHROPLASTY;  Surgeon: Hiram Gash, MD;  Location: [facility];  Service: Orthopedics;  Laterality: Right;  ? ? ?Allergies  ?Allergen Reactions  ? Atorvastatin Other (See Comments)  ? Septra [Sulfamethoxazole-Trimethoprim] Diarrhea and Nausea And Vomiting  ? ? ?Outpatient Encounter Medications as of 07/15/2021  ?Medication Sig  ? acetaminophen (TYLENOL) 500 MG tablet Take 1,000 mg by mouth every 8 (eight) hours as needed for mild pain or moderate pain.  ? bisacodyl (DULCOLAX) 10 MG suppository If not relieved by MOM, give 10 mg Bisacodyl suppositiory rectally X 1 dose in 24 hours as needed  ? DULOXETINE HCL PO Take by mouth. 30 MG CAP TAKE 1 CAPSULE BY MOUTH ONCE DAILY (DO NOT CRUSH  ? Emollient (LUBRIDERM) LOTN Apply 1 application. topically 3 (three) times daily as needed (dry or itchy skin).  ? guaifenesin (ROBITUSSIN) 100 MG/5ML syrup Take 10 mLs by mouth every 4 (four) hours as needed for cough.  ? liver oil-zinc oxide (DESITIN) 40 % ointment Apply 1 application. topically as needed for irritation.  ? Magnesium Hydroxide (MILK OF MAGNESIA PO) Take by mouth. Constipation (1 of 4): If no BM in 3 days, give 30 cc Milk of Magnesium p.o. x 1 dose in  24 hours as needed (Do not use standing constipation orders for residents with renal failure CFR less than 30. Contact MD for orders)  ? Nutritional Supplements (ENSURE NUTRITION SHAKE) LIQD Take 0.5 Bottles by mouth in the morning and at bedtime. Chocolate flavor preferred. ?10a & 2p ( chocolate or vanilla)  ? nystatin cream (MYCOSTATIN) Apply to affected area 2 times daily  ? ondansetron (ZOFRAN ODT) 8 MG disintegrating tablet Take 1 tablet (8 mg total) by  mouth every 8 (eight) hours as needed for nausea.  ? oxyCODONE (ROXICODONE) 5 MG/5ML solution Take 5 mLs (5 mg total) by mouth every 6 (six) hours as needed for severe pain.  ? polyethylene glycol (MIRALAX / GLYCOLAX) 17 g packet Take 17 g by mouth daily.  ? Polyvinyl Alcohol-Povidone (CLEAR EYES NATURAL TEARS) 5-6 MG/ML SOLN Place 1 drop into both eyes at bedtime.  ? senna (SENOKOT) 8.6 MG TABS tablet Take 1 tablet (8.6 mg total) by mouth daily as needed for mild constipation.  ? Sodium Phosphates (RA SALINE ENEMA RE) If not relieved by Biscodyl suppository, give disposable Saline Enema rectally X 1 dose/24 hrs as needed  ? [DISCONTINUED] cephALEXin (KEFLEX) 500 MG capsule Take 1 capsule (500 mg total) by mouth every 12 (twelve) hours.  ? [DISCONTINUED] ibuprofen (ADVIL) 100 MG/5ML suspension Take 20 mLs (400 mg total) by mouth every 6 (six) hours as needed for moderate pain.  ? ?No facility-administered encounter medications on file as of 07/15/2021.  ? ? ?Review of Systems  ?Constitutional:  Negative for appetite change, chills, fatigue and fever.  ?HENT:  Negative for congestion, hearing loss, rhinorrhea and sore throat.   ?Eyes: Negative.   ?Respiratory:  Negative for cough, shortness of breath and wheezing.   ?Cardiovascular:  Negative for chest pain, palpitations and leg swelling.  ?Gastrointestinal:  Negative for abdominal pain, constipation, diarrhea, nausea and vomiting.  ?Genitourinary:  Negative for dysuria.  ?Musculoskeletal:  Negative for arthralgias, back pain and myalgias.  ?Skin:  Negative for color change, rash and wound.  ?Neurological:  Negative for dizziness, weakness and headaches.  ?Psychiatric/Behavioral:  Negative for behavioral problems. The patient is not nervous/anxious.    ? ? ? ?Immunization History  ?Administered Date(s) Administered  ? Influenza Split 12/23/2019  ? Influenza, High Dose Seasonal PF 01/20/2018  ? Moderna Sars-Covid-2 Vaccination 05/05/2019, 06/02/2019, 01/13/2020  ? Td  12/24/2004  ? Tdap 08/29/2015  ? Zoster Recombinat (Shingrix) 01/20/2018, 04/01/2018  ? ?Pertinent  Health Maintenance Due  ?Topic Date Due  ? DEXA SCAN  Never done  ? INFLUENZA VACCINE  11/12/2021  ? ? ?  06/27/2021  ?  5:15 AM 06/27/2021  ? 11:00 AM 06/27/2021  ?  8:00 PM 06/28/2021  ?  9:00 AM 06/28/2021  ?  7:46 PM  ?Fall Risk  ?Patient Fall Risk Level High fall risk High fall risk High fall risk High fall risk High fall risk  ? ? ? ?Vitals:  ? 07/15/21 1502  ?BP: 125/64  ?Pulse: 70  ?Resp: 16  ?Temp: (!) 97.3 ?F (36.3 ?C)  ?SpO2: 96%  ?Weight: 120 lb 12.8 oz (54.8 kg)  ?Height: '5\' 2"'$  (1.575 m)  ? ?Body mass index is 22.09 kg/m?. ? ?Physical Exam ?Constitutional:   ?   General: She is not in acute distress. ?   Appearance: Normal appearance.  ?HENT:  ?   Head: Normocephalic and atraumatic.  ?   Nose: Nose normal.  ?   Mouth/Throat:  ?   Mouth: Mucous membranes are  moist.  ?Eyes:  ?   Conjunctiva/sclera: Conjunctivae normal.  ?Cardiovascular:  ?   Rate and Rhythm: Normal rate and regular rhythm.  ?Pulmonary:  ?   Effort: Pulmonary effort is normal.  ?   Breath sounds: Normal breath sounds.  ?Abdominal:  ?   General: Bowel sounds are normal.  ?   Palpations: Abdomen is soft.  ?Musculoskeletal:     ?   General: No swelling.  ?   Cervical back: Normal range of motion.  ?Skin: ?   General: Skin is warm and dry.  ?Neurological:  ?   General: No focal deficit present.  ?   Mental Status: She is alert. She is disoriented.  ?   Comments: Alert to self, disoriented to time and place.  ?Psychiatric:     ?   Mood and Affect: Mood normal.     ?   Behavior: Behavior normal.  ?  ? ?Labs reviewed: ?Recent Labs  ?  06/25/21 ?0859 06/25/21 ?1131 06/27/21 ?0236 06/28/21 ?6767  ?NA 139 147* 135 135  ?K 4.2 4.5 3.5 3.6  ?CL 105  --  101 103  ?CO2 22  --  25 24  ?GLUCOSE 151*  --  168* 93  ?BUN 22  --  27* 23  ?CREATININE 1.20*  --  0.89 0.79  ?CALCIUM 9.3  --  9.3 8.3*  ? ?Recent Labs  ?  09/04/20 ?2047 06/25/21 ?0859 06/27/21 ?0236   ?AST 50* 42* 67*  ?ALT '17 14 24  '$ ?ALKPHOS 86 75 82  ?BILITOT 0.5 0.6 0.7  ?PROT 8.7* 7.0 7.9  ?ALBUMIN 4.6 3.6 4.1  ? ?Recent Labs  ?  09/04/20 ?2047 06/25/21 ?0859 06/25/21 ?1131 06/27/21 ?0236 06/28/21 ?

## 2021-07-24 DIAGNOSIS — F03C0 Unspecified dementia, severe, without behavioral disturbance, psychotic disturbance, mood disturbance, and anxiety: Secondary | ICD-10-CM | POA: Diagnosis not present

## 2021-07-25 ENCOUNTER — Non-Acute Institutional Stay (SKILLED_NURSING_FACILITY): Payer: Medicare Other | Admitting: Adult Health

## 2021-07-25 ENCOUNTER — Encounter: Payer: Self-pay | Admitting: Adult Health

## 2021-07-25 DIAGNOSIS — S32592S Other specified fracture of left pubis, sequela: Secondary | ICD-10-CM

## 2021-07-25 DIAGNOSIS — B372 Candidiasis of skin and nail: Secondary | ICD-10-CM

## 2021-07-25 DIAGNOSIS — F0390 Unspecified dementia without behavioral disturbance: Secondary | ICD-10-CM

## 2021-07-25 NOTE — Progress Notes (Signed)
? ?Location:  [facility] ?Nursing Home Room Number: 116A ?Place of Service:  SNF (31) ?Provider:  Durenda Age, DNP, FNP-BC ? ?Patient Care Team: ?Lajean Manes, MD as PCP - General (Internal Medicine) ?Alphonsa Overall, MD as Consulting Physician (General Surgery) ? ?Extended Emergency Contact Information ?Primary Emergency Contact: Newman,Patrice ?Address: [place] ?         [place] [address] [place] ?Home Phone: [phone] ?Mobile Phone: [phone] ?Relation: Daughter ?Secondary Emergency Contact: Lucia Gaskins,  Dr. Shanon Brow ?Mobile Phone: [phone] ?Relation: Son ? ?Code Status:  DNR ? ?Goals of care: Advanced Directive information ? ?  07/25/2021  ?  4:00 PM  ?Advanced Directives  ?Does Patient Have a Medical Advance Directive? Yes  ?Type of Advance Directive Out of facility DNR (pink MOST or yellow form)  ?Does patient want to make changes to medical advance directive? No - Patient declined  ?Pre-existing out of facility DNR order (yellow form or pink MOST form) Pink MOST form placed in chart (order not valid for inpatient use)  ? ? ? ?Chief Complaint  ?Patient presents with  ? Acute Visit  ?  Short term rehab  ? ? ?HPI:  ?Pt is a 88 y.o. female seen today for an acute visit regarding rashes on perineal area. She is currently having PT, OT and ST. She is incontinent of bowel and bladder. She was recently completed antibiotic course for UTI. Rashes on perineal area are erythematous with no open area noted. She has pubic ramus fracture sustained from a fall at her ALF. Pain is managed with acetaminophen 1000 mg every 8 hours and duloxetine 60 mg 1 capsule daily. ? ? ?Past Medical History:  ?Diagnosis Date  ? Alzheimer disease ([place])   ? Closed right hip fracture ([place]) 05/2017  ? Hypertension   ? TIA (transient ischemic attack) 2019  ? ?Past Surgical History:  ?Procedure Laterality Date  ? ABDOMINAL HYSTERECTOMY    ? FRACTURE SURGERY    ? left leg  ? INTRAMEDULLARY (IM) NAIL  INTERTROCHANTERIC Right 05/31/2017  ? Procedure: INTRAMEDULLARY (IM) NAIL INTERTROCHANTRIC;  Surgeon: Hiram Gash, MD;  Location: [facility];  Service: Orthopedics;  Laterality: Right;  ? TOTAL SHOULDER ARTHROPLASTY Right 07/20/2019  ? Procedure: TOTAL SHOULDER ARTHROPLASTY;  Surgeon: Hiram Gash, MD;  Location: [facility];  Service: Orthopedics;  Laterality: Right;  ? ? ?Allergies  ?Allergen Reactions  ? Atorvastatin Other (See Comments)  ? Septra [Sulfamethoxazole-Trimethoprim] Diarrhea and Nausea And Vomiting  ? ? ?Outpatient Encounter Medications as of 07/25/2021  ?Medication Sig  ? acetaminophen (TYLENOL) 500 MG tablet Take 1,000 mg by mouth every 8 (eight) hours as needed for mild pain or moderate pain.  ? DULOXETINE HCL PO Take by mouth. 30 MG CAP TAKE 1 CAPSULE BY MOUTH ONCE DAILY (DO NOT CRUSH  ? Emollient (LUBRIDERM) LOTN Apply 1 application. topically 3 (three) times daily as needed (dry or itchy skin).  ? guaiFENesin (GERI-TUSSIN) 100 MG/5ML liquid Take 10 mLs by mouth every 4 (four) hours as needed for cough or to loosen phlegm.  ? liver oil-zinc oxide (DESITIN) 40 % ointment Apply 1 application. topically as needed for irritation.  ? Nutritional Supplements (ENSURE NUTRITION SHAKE) LIQD Take 0.5 Bottles by mouth in the morning and at bedtime. Chocolate flavor preferred. ?10a & 2p ( chocolate or vanilla)  ? nystatin cream (MYCOSTATIN) Apply to affected area 2 times daily  ? ondansetron (ZOFRAN ODT) 8 MG disintegrating tablet Take 1 tablet (8 mg total) by mouth every  8 (eight) hours as needed for nausea.  ? oxyCODONE (ROXICODONE) 5 MG/5ML solution Take 5 mLs (5 mg total) by mouth every 6 (six) hours as needed for severe pain.  ? polyethylene glycol (MIRALAX / GLYCOLAX) 17 g packet Take 17 g by mouth daily.  ? Polyvinyl Alcohol-Povidone (CLEAR EYES NATURAL TEARS) 5-6 MG/ML SOLN Place 1 drop into both eyes at bedtime.  ? senna (SENOKOT) 8.6 MG TABS tablet Take 1 tablet (8.6 mg total) by mouth daily as needed for  mild constipation.  ? [DISCONTINUED] bisacodyl (DULCOLAX) 10 MG suppository If not relieved by MOM, give 10 mg Bisacodyl suppositiory rectally X 1 dose in 24 hours as needed  ? [DISCONTINUED] guaifenesin (ROBITUSSIN) 100 MG/5ML syrup Take 10 mLs by mouth every 4 (four) hours as needed for cough.  ? [DISCONTINUED] Magnesium Hydroxide (MILK OF MAGNESIA PO) Take by mouth. Constipation (1 of 4): If no BM in 3 days, give 30 cc Milk of Magnesium p.o. x 1 dose in 24 hours as needed (Do not use standing constipation orders for residents with renal failure CFR less than 30. Contact MD for orders)  ? [DISCONTINUED] Sodium Phosphates (RA SALINE ENEMA RE) If not relieved by Biscodyl suppository, give disposable Saline Enema rectally X 1 dose/24 hrs as needed  ? ?No facility-administered encounter medications on file as of 07/25/2021.  ? ? ?Review of Systems   Unable to obtain due to dementia. ? ? ? ?Immunization History  ?Administered Date(s) Administered  ? Influenza Split 12/23/2019  ? Influenza, High Dose Seasonal PF 01/20/2018  ? Moderna Sars-Covid-2 Vaccination 05/05/2019, 06/02/2019, 01/13/2020  ? Td 12/24/2004  ? Tdap 08/29/2015  ? Zoster Recombinat (Shingrix) 01/20/2018, 04/01/2018  ? ?Pertinent  Health Maintenance Due  ?Topic Date Due  ? DEXA SCAN  Never done  ? INFLUENZA VACCINE  11/12/2021  ? ? ?  06/27/2021  ?  5:15 AM 06/27/2021  ? 11:00 AM 06/27/2021  ?  8:00 PM 06/28/2021  ?  9:00 AM 06/28/2021  ?  7:46 PM  ?Fall Risk  ?Patient Fall Risk Level High fall risk High fall risk High fall risk High fall risk High fall risk  ? ? ? ?Vitals:  ? 07/25/21 1548  ?BP: (!) 101/57  ?Pulse: 78  ?Resp: 18  ?Temp: (!) 97.3 ?F (36.3 ?C)  ?Weight: 114 lb 12.8 oz (52.1 kg)  ?Height: '5\' 2"'$  (1.575 m)  ? ?Body mass index is 21 kg/m?. ? ?Physical Exam ?Constitutional:   ?   General: She is not in acute distress. ?   Appearance: Normal appearance.  ?HENT:  ?   Head: Normocephalic and atraumatic.  ?   Nose: Nose normal.  ?   Mouth/Throat:  ?    Mouth: Mucous membranes are moist.  ?Eyes:  ?   Conjunctiva/sclera: Conjunctivae normal.  ?Cardiovascular:  ?   Rate and Rhythm: Normal rate and regular rhythm.  ?Pulmonary:  ?   Effort: Pulmonary effort is normal.  ?   Breath sounds: Normal breath sounds.  ?Abdominal:  ?   General: Bowel sounds are normal.  ?   Palpations: Abdomen is soft.  ?Musculoskeletal:  ?   Cervical back: Normal range of motion.  ?Skin: ?   General: Skin is warm and dry.  ?   Comments: Erythematous rashes on perineal area.  ?Neurological:  ?   Mental Status: Mental status is at baseline. She is disoriented.  ?Psychiatric:     ?   Mood and Affect: Mood normal.     ?  Behavior: Behavior normal.  ?  ? ? ? ?Labs reviewed: ?Recent Labs  ?  06/25/21 ?0859 06/25/21 ?1131 06/27/21 ?0236 06/28/21 ?9381  ?NA 139 147* 135 135  ?K 4.2 4.5 3.5 3.6  ?CL 105  --  101 103  ?CO2 22  --  25 24  ?GLUCOSE 151*  --  168* 93  ?BUN 22  --  27* 23  ?CREATININE 1.20*  --  0.89 0.79  ?CALCIUM 9.3  --  9.3 8.3*  ? ?Recent Labs  ?  09/04/20 ?2047 06/25/21 ?0859 06/27/21 ?0236  ?AST 50* 42* 67*  ?ALT '17 14 24  '$ ?ALKPHOS 86 75 82  ?BILITOT 0.5 0.6 0.7  ?PROT 8.7* 7.0 7.9  ?ALBUMIN 4.6 3.6 4.1  ? ?Recent Labs  ?  09/04/20 ?2047 06/25/21 ?0859 06/25/21 ?1131 06/27/21 ?0236 06/28/21 ?8299  ?WBC 10.0 10.1  --  17.8* 7.9  ?NEUTROABS 7.9* 8.1*  --  16.2*  --   ?HGB 13.3 12.5 13.3 11.8* 11.0*  ?HCT 41.4 38.3 39.0 35.8* 33.2*  ?MCV 99.0 98.5  --  96.5 95.7  ?PLT 210 209  --  176 128*  ? ?Lab Results  ?Component Value Date  ? TSH 8.59 (A) 07/10/2021  ? ?Lab Results  ?Component Value Date  ? HGBA1C 5.4 05/17/2016  ? ?Lab Results  ?Component Value Date  ? CHOL 276 (H) 05/17/2016  ? HDL 56 05/17/2016  ? LDLCALC 186 (H) 05/17/2016  ? TRIG 172 (H) 05/17/2016  ? CHOLHDL 4.9 05/17/2016  ? ? ?Significant Diagnostic Results in last 30 days:  ?DG Chest 2 View ? ?Result Date: 06/27/2021 ?CLINICAL DATA:  Pain after fall out of bed. EXAM: CHEST - 2 VIEW COMPARISON:  10/25/2019 FINDINGS: Shallow  inspiration. Heart size and pulmonary vascularity are normal. Emphysematous changes in the lungs. No airspace disease or consolidation. No pleural effusions. No pneumothorax. Postoperative changes in the ri

## 2021-08-01 ENCOUNTER — Non-Acute Institutional Stay (SKILLED_NURSING_FACILITY): Payer: Medicare Other | Admitting: Adult Health

## 2021-08-01 ENCOUNTER — Encounter: Payer: Self-pay | Admitting: Adult Health

## 2021-08-01 DIAGNOSIS — U071 COVID-19: Secondary | ICD-10-CM | POA: Diagnosis not present

## 2021-08-01 DIAGNOSIS — F0390 Unspecified dementia without behavioral disturbance: Secondary | ICD-10-CM

## 2021-08-01 DIAGNOSIS — S32592S Other specified fracture of left pubis, sequela: Secondary | ICD-10-CM

## 2021-08-01 DIAGNOSIS — N390 Urinary tract infection, site not specified: Secondary | ICD-10-CM

## 2021-08-01 DIAGNOSIS — D649 Anemia, unspecified: Secondary | ICD-10-CM | POA: Diagnosis not present

## 2021-08-01 DIAGNOSIS — B372 Candidiasis of skin and nail: Secondary | ICD-10-CM

## 2021-08-01 MED ORDER — NYSTATIN 100000 UNIT/GM EX CREA: TOPICAL_CREAM | CUTANEOUS | 0 refills | Status: AC

## 2021-08-01 MED ORDER — DULOXETINE HCL 30 MG PO CPEP: 30.0000 mg | ORAL_CAPSULE | Freq: Every day | ORAL | 0 refills | Status: AC

## 2021-08-01 MED ORDER — ONDANSETRON 8 MG PO TBDP: 8.0000 mg | ORAL_TABLET | Freq: Three times a day (TID) | ORAL | 0 refills | Status: AC | PRN

## 2021-08-01 NOTE — Progress Notes (Signed)
? ?Location:  [facility] ?Nursing Home Room Number: 116A ?Place of Service:  SNF (31) ?Provider:  Durenda Age, DNP, FNP-BC ? ?Patient Care Team: ?Lajean Manes, MD as PCP - General (Internal Medicine) ?Alphonsa Overall, MD as Consulting Physician (General Surgery) ? ?Extended Emergency Contact Information ?Primary Emergency Contact: Newman,Patrice ?Address: [place] ?         [place] [address] [place] ?Home Phone: [phone] ?Mobile Phone: [phone] ?Relation: Daughter ?Secondary Emergency Contact: Lucia Gaskins,  Dr. Shanon Brow ?Mobile Phone: [phone] ?Relation: Son ? ?Code Status:  DNR ? ?Goals of care: Advanced Directive information ? ?  08/01/2021  ?  2:36 PM  ?Advanced Directives  ?Does Patient Have a Medical Advance Directive? Yes  ?Type of Advance Directive Out of facility DNR (pink MOST or yellow form)  ?Does patient want to make changes to medical advance directive? No - Patient declined  ?Pre-existing out of facility DNR order (yellow form or pink MOST form) Pink MOST form placed in chart (order not valid for inpatient use);Yellow form placed in chart (order not valid for inpatient use)  ? ? ? ?Chief Complaint  ?Patient presents with  ? Discharge Note  ?  Discharge  ? ? ?HPI:  ?Pt is a 88 y.o. female who is for discharge back to [facility] wherein she'll have PT and OT. ? ?She was admitted to [facility] on 06/28/21 post hospital admission 06/27/21 to 06/28/21.  She has a PMH of TIA and dementia.  She lives at [facility] wherein she had an unwitnessed fall and complained of back pain.  She was brought to ED and was found to have a pubic ramus fracture and hematoma to right forehead.  WBC was noted to be elevated at 17 and was thought to be due to UTI.  She was given ceftriaxone x2 days.  Incidentally, she tested positive for COVID-19 but was asymptomatic.  She has 3 Moderna COVID-19 vaccines. ? ?Patient was admitted to this facility  for short-term rehabilitation after the patient's recent hospitalization.  Patient has completed SNF rehabilitation and therapy has cleared the patient for discharge. ? ? ?Past Medical History:  ?Diagnosis Date  ? Alzheimer disease ([place])   ? Closed right hip fracture ([place]) 05/2017  ? Hypertension   ? TIA (transient ischemic attack) 2019  ? ?Past Surgical History:  ?Procedure Laterality Date  ? ABDOMINAL HYSTERECTOMY    ? FRACTURE SURGERY    ? left leg  ? INTRAMEDULLARY (IM) NAIL INTERTROCHANTERIC Right 05/31/2017  ? Procedure: INTRAMEDULLARY (IM) NAIL INTERTROCHANTRIC;  Surgeon: Hiram Gash, MD;  Location: [facility];  Service: Orthopedics;  Laterality: Right;  ? TOTAL SHOULDER ARTHROPLASTY Right 07/20/2019  ? Procedure: TOTAL SHOULDER ARTHROPLASTY;  Surgeon: Hiram Gash, MD;  Location: [facility];  Service: Orthopedics;  Laterality: Right;  ? ? ?Allergies  ?Allergen Reactions  ? Atorvastatin Other (See Comments)  ? Septra [Sulfamethoxazole-Trimethoprim] Diarrhea and Nausea And Vomiting  ? ? ?Outpatient Encounter Medications as of 08/01/2021  ?Medication Sig  ? acetaminophen (TYLENOL) 500 MG tablet Take 1,000 mg by mouth every 8 (eight) hours as needed for mild pain or moderate pain.  ? DULOXETINE HCL PO Take by mouth. 30 MG CAP TAKE 1 CAPSULE BY MOUTH ONCE DAILY (DO NOT CRUSH  ? Emollient (LUBRIDERM) LOTN Apply 1 application. topically 3 (three) times daily as needed (dry or itchy skin).  ? guaiFENesin (ROBITUSSIN) 100 MG/5ML liquid Take 10 mLs by mouth every 4 (four) hours as  needed for cough or to loosen phlegm.  ? liver oil-zinc oxide (DESITIN) 40 % ointment Apply 1 application. topically as needed for irritation.  ? Nutritional Supplements (ENSURE NUTRITION SHAKE) LIQD Take 0.5 Bottles by mouth in the morning and at bedtime. Chocolate flavor preferred. ?10a & 2p ( chocolate or vanilla)  ? nystatin cream (MYCOSTATIN) Apply to affected area 2 times daily  ? ondansetron (ZOFRAN ODT) 8 MG disintegrating tablet Take 1  tablet (8 mg total) by mouth every 8 (eight) hours as needed for nausea.  ? polyethylene glycol (MIRALAX / GLYCOLAX) 17 g packet Take 17 g by mouth daily.  ? Polyvinyl Alcohol-Povidone (CLEAR EYES NATURAL TEARS) 5-6 MG/ML SOLN Place 1 drop into both eyes at bedtime.  ? senna (SENOKOT) 8.6 MG TABS tablet Take 1 tablet (8.6 mg total) by mouth daily as needed for mild constipation.  ? [DISCONTINUED] oxyCODONE (ROXICODONE) 5 MG/5ML solution Take 5 mLs (5 mg total) by mouth every 6 (six) hours as needed for severe pain.  ? ?No facility-administered encounter medications on file as of 08/01/2021.  ? ? ?Review of Systems   Unable to obtain due to dementia. ? ? ?Immunization History  ?Administered Date(s) Administered  ? Influenza Split 12/23/2019  ? Influenza, High Dose Seasonal PF 01/20/2018, 12/31/2018  ? Moderna Sars-Covid-2 Vaccination 05/05/2019, 06/02/2019, 01/13/2020  ? Td 12/24/2004  ? Tdap 08/29/2015  ? Zoster Recombinat (Shingrix) 01/20/2018, 04/01/2018  ? ?Pertinent  Health Maintenance Due  ?Topic Date Due  ? DEXA SCAN  Never done  ? INFLUENZA VACCINE  11/12/2021  ? ? ?  06/27/2021  ?  5:15 AM 06/27/2021  ? 11:00 AM 06/27/2021  ?  8:00 PM 06/28/2021  ?  9:00 AM 06/28/2021  ?  7:46 PM  ?Fall Risk  ?Patient Fall Risk Level High fall risk High fall risk High fall risk High fall risk High fall risk  ? ? ? ?Vitals:  ? 08/01/21 1434  ?BP: 130/71  ?Pulse: 66  ?Resp: 17  ?Temp: 97.7 ?F (36.5 ?C)  ?Weight: 111 lb 3.2 oz (50.4 kg)  ?Height: '5\' 2"'$  (1.575 m)  ? ?Body mass index is 20.34 kg/m?. ? ?Physical Exam ?Constitutional:   ?   General: She is not in acute distress. ?   Appearance: Normal appearance.  ?HENT:  ?   Head: Normocephalic and atraumatic.  ?   Nose: Nose normal.  ?   Mouth/Throat:  ?   Mouth: Mucous membranes are moist.  ?Eyes:  ?   Conjunctiva/sclera: Conjunctivae normal.  ?Cardiovascular:  ?   Rate and Rhythm: Normal rate and regular rhythm.  ?Pulmonary:  ?   Effort: Pulmonary effort is normal.  ?   Breath  sounds: Normal breath sounds.  ?Abdominal:  ?   General: Bowel sounds are normal.  ?   Palpations: Abdomen is soft.  ?Musculoskeletal:  ?   Cervical back: Normal range of motion.  ?Skin: ?   General: Skin is warm and dry.  ?   Findings: Bruising present.  ?   Comments: Greenish healing bruise on right temporal region.  ?Neurological:  ?   Mental Status: Mental status is at baseline. She is disoriented.  ?   Comments: Alert to self, disoriented to time and place.  ?Psychiatric:     ?   Mood and Affect: Mood normal.     ?   Behavior: Behavior normal.  ?  ? ? ? ?Labs reviewed: ?Recent Labs  ?  06/25/21 ?0859 06/25/21 ?1131 06/27/21 ?0236  06/28/21 ?0932  ?NA 139 147* 135 135  ?K 4.2 4.5 3.5 3.6  ?CL 105  --  101 103  ?CO2 22  --  25 24  ?GLUCOSE 151*  --  168* 93  ?BUN 22  --  27* 23  ?CREATININE 1.20*  --  0.89 0.79  ?CALCIUM 9.3  --  9.3 8.3*  ? ?Recent Labs  ?  09/04/20 ?2047 06/25/21 ?0859 06/27/21 ?0236  ?AST 50* 42* 67*  ?ALT '17 14 24  '$ ?ALKPHOS 86 75 82  ?BILITOT 0.5 0.6 0.7  ?PROT 8.7* 7.0 7.9  ?ALBUMIN 4.6 3.6 4.1  ? ?Recent Labs  ?  09/04/20 ?2047 06/25/21 ?0859 06/25/21 ?1131 06/27/21 ?0236 06/28/21 ?3557  ?WBC 10.0 10.1  --  17.8* 7.9  ?NEUTROABS 7.9* 8.1*  --  16.2*  --   ?HGB 13.3 12.5 13.3 11.8* 11.0*  ?HCT 41.4 38.3 39.0 35.8* 33.2*  ?MCV 99.0 98.5  --  96.5 95.7  ?PLT 210 209  --  176 128*  ? ?Lab Results  ?Component Value Date  ? TSH 8.59 (A) 07/10/2021  ? ?Lab Results  ?Component Value Date  ? HGBA1C 5.4 05/17/2016  ? ?Lab Results  ?Component Value Date  ? CHOL 276 (H) 05/17/2016  ? HDL 56 05/17/2016  ? LDLCALC 186 (H) 05/17/2016  ? TRIG 172 (H) 05/17/2016  ? CHOLHDL 4.9 05/17/2016  ? ? ?Significant Diagnostic Results in last 30 days:  ?No results found. ? ?Assessment/Plan ? ?1. Closed fracture of inferior pubic ramus, left, sequela ?-   for Home health PT and OT, for therapeutic strengthening exercises ?- DULoxetine (CYMBALTA) 30 MG capsule; Take 1 capsule (30 mg total) by mouth daily. 30 MG CAP TAKE  1 CAPSULE BY MOUTH ONCE DAILY (DO NOT CRUSH) -  send to [facility] on 08/05/21  Dispense: 30 capsule; Refill: 0 ?-  Acetaminophen 500 mg give 2 tabs = 1,000 mg PO Q 8 hours ? ?2. Urinary tract i

## 2021-08-06 DIAGNOSIS — L853 Xerosis cutis: Secondary | ICD-10-CM | POA: Diagnosis not present

## 2021-08-06 DIAGNOSIS — R159 Full incontinence of feces: Secondary | ICD-10-CM | POA: Diagnosis not present

## 2021-08-06 DIAGNOSIS — F0390 Unspecified dementia without behavioral disturbance: Secondary | ICD-10-CM | POA: Diagnosis not present

## 2021-08-06 DIAGNOSIS — R296 Repeated falls: Secondary | ICD-10-CM | POA: Diagnosis not present

## 2021-08-06 DIAGNOSIS — R32 Unspecified urinary incontinence: Secondary | ICD-10-CM | POA: Diagnosis not present

## 2021-08-06 DIAGNOSIS — I69318 Other symptoms and signs involving cognitive functions following cerebral infarction: Secondary | ICD-10-CM | POA: Diagnosis not present

## 2021-08-06 DIAGNOSIS — H04129 Dry eye syndrome of unspecified lacrimal gland: Secondary | ICD-10-CM | POA: Diagnosis not present

## 2021-08-06 DIAGNOSIS — I10 Essential (primary) hypertension: Secondary | ICD-10-CM | POA: Diagnosis not present

## 2021-08-06 DIAGNOSIS — H3530 Unspecified macular degeneration: Secondary | ICD-10-CM | POA: Diagnosis not present

## 2021-08-06 DIAGNOSIS — Z8616 Personal history of COVID-19: Secondary | ICD-10-CM | POA: Diagnosis not present

## 2021-08-07 DIAGNOSIS — Z8616 Personal history of COVID-19: Secondary | ICD-10-CM | POA: Diagnosis not present

## 2021-08-07 DIAGNOSIS — F0390 Unspecified dementia without behavioral disturbance: Secondary | ICD-10-CM | POA: Diagnosis not present

## 2021-08-07 DIAGNOSIS — R32 Unspecified urinary incontinence: Secondary | ICD-10-CM | POA: Diagnosis not present

## 2021-08-07 DIAGNOSIS — R296 Repeated falls: Secondary | ICD-10-CM | POA: Diagnosis not present

## 2021-08-07 DIAGNOSIS — I10 Essential (primary) hypertension: Secondary | ICD-10-CM | POA: Diagnosis not present

## 2021-08-07 DIAGNOSIS — I69318 Other symptoms and signs involving cognitive functions following cerebral infarction: Secondary | ICD-10-CM | POA: Diagnosis not present

## 2021-08-08 DIAGNOSIS — R32 Unspecified urinary incontinence: Secondary | ICD-10-CM | POA: Diagnosis not present

## 2021-08-08 DIAGNOSIS — Z8616 Personal history of COVID-19: Secondary | ICD-10-CM | POA: Diagnosis not present

## 2021-08-08 DIAGNOSIS — I10 Essential (primary) hypertension: Secondary | ICD-10-CM | POA: Diagnosis not present

## 2021-08-08 DIAGNOSIS — R296 Repeated falls: Secondary | ICD-10-CM | POA: Diagnosis not present

## 2021-08-08 DIAGNOSIS — F0390 Unspecified dementia without behavioral disturbance: Secondary | ICD-10-CM | POA: Diagnosis not present

## 2021-08-08 DIAGNOSIS — I69318 Other symptoms and signs involving cognitive functions following cerebral infarction: Secondary | ICD-10-CM | POA: Diagnosis not present

## 2021-08-09 DIAGNOSIS — I10 Essential (primary) hypertension: Secondary | ICD-10-CM | POA: Diagnosis not present

## 2021-08-09 DIAGNOSIS — R296 Repeated falls: Secondary | ICD-10-CM | POA: Diagnosis not present

## 2021-08-09 DIAGNOSIS — I69318 Other symptoms and signs involving cognitive functions following cerebral infarction: Secondary | ICD-10-CM | POA: Diagnosis not present

## 2021-08-09 DIAGNOSIS — F0390 Unspecified dementia without behavioral disturbance: Secondary | ICD-10-CM | POA: Diagnosis not present

## 2021-08-09 DIAGNOSIS — R32 Unspecified urinary incontinence: Secondary | ICD-10-CM | POA: Diagnosis not present

## 2021-08-09 DIAGNOSIS — Z8616 Personal history of COVID-19: Secondary | ICD-10-CM | POA: Diagnosis not present

## 2021-08-12 DIAGNOSIS — R296 Repeated falls: Secondary | ICD-10-CM | POA: Diagnosis not present

## 2021-08-12 DIAGNOSIS — H04129 Dry eye syndrome of unspecified lacrimal gland: Secondary | ICD-10-CM | POA: Diagnosis not present

## 2021-08-12 DIAGNOSIS — H3530 Unspecified macular degeneration: Secondary | ICD-10-CM | POA: Diagnosis not present

## 2021-08-12 DIAGNOSIS — L853 Xerosis cutis: Secondary | ICD-10-CM | POA: Diagnosis not present

## 2021-08-12 DIAGNOSIS — R32 Unspecified urinary incontinence: Secondary | ICD-10-CM | POA: Diagnosis not present

## 2021-08-12 DIAGNOSIS — F0390 Unspecified dementia without behavioral disturbance: Secondary | ICD-10-CM | POA: Diagnosis not present

## 2021-08-12 DIAGNOSIS — Z8616 Personal history of COVID-19: Secondary | ICD-10-CM | POA: Diagnosis not present

## 2021-08-12 DIAGNOSIS — R159 Full incontinence of feces: Secondary | ICD-10-CM | POA: Diagnosis not present

## 2021-08-12 DIAGNOSIS — I10 Essential (primary) hypertension: Secondary | ICD-10-CM | POA: Diagnosis not present

## 2021-08-12 DIAGNOSIS — I69318 Other symptoms and signs involving cognitive functions following cerebral infarction: Secondary | ICD-10-CM | POA: Diagnosis not present

## 2021-08-14 DIAGNOSIS — Z8616 Personal history of COVID-19: Secondary | ICD-10-CM | POA: Diagnosis not present

## 2021-08-14 DIAGNOSIS — R296 Repeated falls: Secondary | ICD-10-CM | POA: Diagnosis not present

## 2021-08-14 DIAGNOSIS — I10 Essential (primary) hypertension: Secondary | ICD-10-CM | POA: Diagnosis not present

## 2021-08-14 DIAGNOSIS — F0390 Unspecified dementia without behavioral disturbance: Secondary | ICD-10-CM | POA: Diagnosis not present

## 2021-08-14 DIAGNOSIS — R32 Unspecified urinary incontinence: Secondary | ICD-10-CM | POA: Diagnosis not present

## 2021-08-14 DIAGNOSIS — I69318 Other symptoms and signs involving cognitive functions following cerebral infarction: Secondary | ICD-10-CM | POA: Diagnosis not present

## 2021-08-16 DIAGNOSIS — I10 Essential (primary) hypertension: Secondary | ICD-10-CM | POA: Diagnosis not present

## 2021-08-16 DIAGNOSIS — Z8616 Personal history of COVID-19: Secondary | ICD-10-CM | POA: Diagnosis not present

## 2021-08-16 DIAGNOSIS — R296 Repeated falls: Secondary | ICD-10-CM | POA: Diagnosis not present

## 2021-08-16 DIAGNOSIS — I69318 Other symptoms and signs involving cognitive functions following cerebral infarction: Secondary | ICD-10-CM | POA: Diagnosis not present

## 2021-08-16 DIAGNOSIS — R32 Unspecified urinary incontinence: Secondary | ICD-10-CM | POA: Diagnosis not present

## 2021-08-16 DIAGNOSIS — F0390 Unspecified dementia without behavioral disturbance: Secondary | ICD-10-CM | POA: Diagnosis not present

## 2021-08-19 DIAGNOSIS — F0390 Unspecified dementia without behavioral disturbance: Secondary | ICD-10-CM | POA: Diagnosis not present

## 2021-08-19 DIAGNOSIS — Z8616 Personal history of COVID-19: Secondary | ICD-10-CM | POA: Diagnosis not present

## 2021-08-19 DIAGNOSIS — I10 Essential (primary) hypertension: Secondary | ICD-10-CM | POA: Diagnosis not present

## 2021-08-19 DIAGNOSIS — R296 Repeated falls: Secondary | ICD-10-CM | POA: Diagnosis not present

## 2021-08-19 DIAGNOSIS — I69318 Other symptoms and signs involving cognitive functions following cerebral infarction: Secondary | ICD-10-CM | POA: Diagnosis not present

## 2021-08-19 DIAGNOSIS — R32 Unspecified urinary incontinence: Secondary | ICD-10-CM | POA: Diagnosis not present

## 2021-08-20 DIAGNOSIS — Z8616 Personal history of COVID-19: Secondary | ICD-10-CM | POA: Diagnosis not present

## 2021-08-20 DIAGNOSIS — R296 Repeated falls: Secondary | ICD-10-CM | POA: Diagnosis not present

## 2021-08-20 DIAGNOSIS — F0390 Unspecified dementia without behavioral disturbance: Secondary | ICD-10-CM | POA: Diagnosis not present

## 2021-08-20 DIAGNOSIS — I69318 Other symptoms and signs involving cognitive functions following cerebral infarction: Secondary | ICD-10-CM | POA: Diagnosis not present

## 2021-08-20 DIAGNOSIS — R32 Unspecified urinary incontinence: Secondary | ICD-10-CM | POA: Diagnosis not present

## 2021-08-20 DIAGNOSIS — I10 Essential (primary) hypertension: Secondary | ICD-10-CM | POA: Diagnosis not present

## 2021-08-21 DIAGNOSIS — R32 Unspecified urinary incontinence: Secondary | ICD-10-CM | POA: Diagnosis not present

## 2021-08-21 DIAGNOSIS — Z8616 Personal history of COVID-19: Secondary | ICD-10-CM | POA: Diagnosis not present

## 2021-08-21 DIAGNOSIS — R296 Repeated falls: Secondary | ICD-10-CM | POA: Diagnosis not present

## 2021-08-21 DIAGNOSIS — F0390 Unspecified dementia without behavioral disturbance: Secondary | ICD-10-CM | POA: Diagnosis not present

## 2021-08-21 DIAGNOSIS — I69318 Other symptoms and signs involving cognitive functions following cerebral infarction: Secondary | ICD-10-CM | POA: Diagnosis not present

## 2021-08-21 DIAGNOSIS — I10 Essential (primary) hypertension: Secondary | ICD-10-CM | POA: Diagnosis not present

## 2021-08-23 DIAGNOSIS — F0390 Unspecified dementia without behavioral disturbance: Secondary | ICD-10-CM | POA: Diagnosis not present

## 2021-08-23 DIAGNOSIS — Z8616 Personal history of COVID-19: Secondary | ICD-10-CM | POA: Diagnosis not present

## 2021-08-23 DIAGNOSIS — I69318 Other symptoms and signs involving cognitive functions following cerebral infarction: Secondary | ICD-10-CM | POA: Diagnosis not present

## 2021-08-23 DIAGNOSIS — R296 Repeated falls: Secondary | ICD-10-CM | POA: Diagnosis not present

## 2021-08-23 DIAGNOSIS — R32 Unspecified urinary incontinence: Secondary | ICD-10-CM | POA: Diagnosis not present

## 2021-08-23 DIAGNOSIS — I10 Essential (primary) hypertension: Secondary | ICD-10-CM | POA: Diagnosis not present

## 2021-08-26 DIAGNOSIS — R296 Repeated falls: Secondary | ICD-10-CM | POA: Diagnosis not present

## 2021-08-26 DIAGNOSIS — Z8616 Personal history of COVID-19: Secondary | ICD-10-CM | POA: Diagnosis not present

## 2021-08-26 DIAGNOSIS — I10 Essential (primary) hypertension: Secondary | ICD-10-CM | POA: Diagnosis not present

## 2021-08-26 DIAGNOSIS — F0390 Unspecified dementia without behavioral disturbance: Secondary | ICD-10-CM | POA: Diagnosis not present

## 2021-08-26 DIAGNOSIS — I69318 Other symptoms and signs involving cognitive functions following cerebral infarction: Secondary | ICD-10-CM | POA: Diagnosis not present

## 2021-08-26 DIAGNOSIS — R32 Unspecified urinary incontinence: Secondary | ICD-10-CM | POA: Diagnosis not present

## 2021-08-28 DIAGNOSIS — F0390 Unspecified dementia without behavioral disturbance: Secondary | ICD-10-CM | POA: Diagnosis not present

## 2021-08-28 DIAGNOSIS — Z8616 Personal history of COVID-19: Secondary | ICD-10-CM | POA: Diagnosis not present

## 2021-08-28 DIAGNOSIS — R296 Repeated falls: Secondary | ICD-10-CM | POA: Diagnosis not present

## 2021-08-28 DIAGNOSIS — I69318 Other symptoms and signs involving cognitive functions following cerebral infarction: Secondary | ICD-10-CM | POA: Diagnosis not present

## 2021-08-28 DIAGNOSIS — I10 Essential (primary) hypertension: Secondary | ICD-10-CM | POA: Diagnosis not present

## 2021-08-28 DIAGNOSIS — R32 Unspecified urinary incontinence: Secondary | ICD-10-CM | POA: Diagnosis not present

## 2021-08-30 DIAGNOSIS — I69318 Other symptoms and signs involving cognitive functions following cerebral infarction: Secondary | ICD-10-CM | POA: Diagnosis not present

## 2021-08-30 DIAGNOSIS — R32 Unspecified urinary incontinence: Secondary | ICD-10-CM | POA: Diagnosis not present

## 2021-08-30 DIAGNOSIS — R296 Repeated falls: Secondary | ICD-10-CM | POA: Diagnosis not present

## 2021-08-30 DIAGNOSIS — I10 Essential (primary) hypertension: Secondary | ICD-10-CM | POA: Diagnosis not present

## 2021-08-30 DIAGNOSIS — Z8616 Personal history of COVID-19: Secondary | ICD-10-CM | POA: Diagnosis not present

## 2021-08-30 DIAGNOSIS — F0390 Unspecified dementia without behavioral disturbance: Secondary | ICD-10-CM | POA: Diagnosis not present

## 2021-09-04 DIAGNOSIS — Z8616 Personal history of COVID-19: Secondary | ICD-10-CM | POA: Diagnosis not present

## 2021-09-04 DIAGNOSIS — I69318 Other symptoms and signs involving cognitive functions following cerebral infarction: Secondary | ICD-10-CM | POA: Diagnosis not present

## 2021-09-04 DIAGNOSIS — F0390 Unspecified dementia without behavioral disturbance: Secondary | ICD-10-CM | POA: Diagnosis not present

## 2021-09-04 DIAGNOSIS — R32 Unspecified urinary incontinence: Secondary | ICD-10-CM | POA: Diagnosis not present

## 2021-09-04 DIAGNOSIS — R296 Repeated falls: Secondary | ICD-10-CM | POA: Diagnosis not present

## 2021-09-04 DIAGNOSIS — I10 Essential (primary) hypertension: Secondary | ICD-10-CM | POA: Diagnosis not present

## 2021-09-05 DIAGNOSIS — R296 Repeated falls: Secondary | ICD-10-CM | POA: Diagnosis not present

## 2021-09-05 DIAGNOSIS — I69318 Other symptoms and signs involving cognitive functions following cerebral infarction: Secondary | ICD-10-CM | POA: Diagnosis not present

## 2021-09-05 DIAGNOSIS — I10 Essential (primary) hypertension: Secondary | ICD-10-CM | POA: Diagnosis not present

## 2021-09-05 DIAGNOSIS — F0390 Unspecified dementia without behavioral disturbance: Secondary | ICD-10-CM | POA: Diagnosis not present

## 2021-09-05 DIAGNOSIS — R32 Unspecified urinary incontinence: Secondary | ICD-10-CM | POA: Diagnosis not present

## 2021-09-05 DIAGNOSIS — Z8616 Personal history of COVID-19: Secondary | ICD-10-CM | POA: Diagnosis not present

## 2021-09-06 DIAGNOSIS — I69318 Other symptoms and signs involving cognitive functions following cerebral infarction: Secondary | ICD-10-CM | POA: Diagnosis not present

## 2021-09-06 DIAGNOSIS — R296 Repeated falls: Secondary | ICD-10-CM | POA: Diagnosis not present

## 2021-09-06 DIAGNOSIS — R32 Unspecified urinary incontinence: Secondary | ICD-10-CM | POA: Diagnosis not present

## 2021-09-06 DIAGNOSIS — I10 Essential (primary) hypertension: Secondary | ICD-10-CM | POA: Diagnosis not present

## 2021-09-06 DIAGNOSIS — Z8616 Personal history of COVID-19: Secondary | ICD-10-CM | POA: Diagnosis not present

## 2021-09-06 DIAGNOSIS — F0390 Unspecified dementia without behavioral disturbance: Secondary | ICD-10-CM | POA: Diagnosis not present

## 2021-09-10 DIAGNOSIS — R32 Unspecified urinary incontinence: Secondary | ICD-10-CM | POA: Diagnosis not present

## 2021-09-10 DIAGNOSIS — I69318 Other symptoms and signs involving cognitive functions following cerebral infarction: Secondary | ICD-10-CM | POA: Diagnosis not present

## 2021-09-10 DIAGNOSIS — F0390 Unspecified dementia without behavioral disturbance: Secondary | ICD-10-CM | POA: Diagnosis not present

## 2021-09-10 DIAGNOSIS — Z8616 Personal history of COVID-19: Secondary | ICD-10-CM | POA: Diagnosis not present

## 2021-09-10 DIAGNOSIS — I10 Essential (primary) hypertension: Secondary | ICD-10-CM | POA: Diagnosis not present

## 2021-09-10 DIAGNOSIS — R296 Repeated falls: Secondary | ICD-10-CM | POA: Diagnosis not present

## 2021-09-11 DIAGNOSIS — R296 Repeated falls: Secondary | ICD-10-CM | POA: Diagnosis not present

## 2021-09-11 DIAGNOSIS — Z8616 Personal history of COVID-19: Secondary | ICD-10-CM | POA: Diagnosis not present

## 2021-09-11 DIAGNOSIS — I10 Essential (primary) hypertension: Secondary | ICD-10-CM | POA: Diagnosis not present

## 2021-09-11 DIAGNOSIS — I69318 Other symptoms and signs involving cognitive functions following cerebral infarction: Secondary | ICD-10-CM | POA: Diagnosis not present

## 2021-09-11 DIAGNOSIS — R32 Unspecified urinary incontinence: Secondary | ICD-10-CM | POA: Diagnosis not present

## 2021-09-11 DIAGNOSIS — F0390 Unspecified dementia without behavioral disturbance: Secondary | ICD-10-CM | POA: Diagnosis not present

## 2021-09-12 DIAGNOSIS — L853 Xerosis cutis: Secondary | ICD-10-CM | POA: Diagnosis not present

## 2021-09-12 DIAGNOSIS — H04129 Dry eye syndrome of unspecified lacrimal gland: Secondary | ICD-10-CM | POA: Diagnosis not present

## 2021-09-12 DIAGNOSIS — Z8616 Personal history of COVID-19: Secondary | ICD-10-CM | POA: Diagnosis not present

## 2021-09-12 DIAGNOSIS — I69318 Other symptoms and signs involving cognitive functions following cerebral infarction: Secondary | ICD-10-CM | POA: Diagnosis not present

## 2021-09-12 DIAGNOSIS — R32 Unspecified urinary incontinence: Secondary | ICD-10-CM | POA: Diagnosis not present

## 2021-09-12 DIAGNOSIS — H3530 Unspecified macular degeneration: Secondary | ICD-10-CM | POA: Diagnosis not present

## 2021-09-12 DIAGNOSIS — I10 Essential (primary) hypertension: Secondary | ICD-10-CM | POA: Diagnosis not present

## 2021-09-12 DIAGNOSIS — R296 Repeated falls: Secondary | ICD-10-CM | POA: Diagnosis not present

## 2021-09-12 DIAGNOSIS — F0390 Unspecified dementia without behavioral disturbance: Secondary | ICD-10-CM | POA: Diagnosis not present

## 2021-09-12 DIAGNOSIS — R159 Full incontinence of feces: Secondary | ICD-10-CM | POA: Diagnosis not present

## 2021-09-13 DIAGNOSIS — I69318 Other symptoms and signs involving cognitive functions following cerebral infarction: Secondary | ICD-10-CM | POA: Diagnosis not present

## 2021-09-13 DIAGNOSIS — Z8616 Personal history of COVID-19: Secondary | ICD-10-CM | POA: Diagnosis not present

## 2021-09-13 DIAGNOSIS — F0390 Unspecified dementia without behavioral disturbance: Secondary | ICD-10-CM | POA: Diagnosis not present

## 2021-09-13 DIAGNOSIS — R32 Unspecified urinary incontinence: Secondary | ICD-10-CM | POA: Diagnosis not present

## 2021-09-13 DIAGNOSIS — I10 Essential (primary) hypertension: Secondary | ICD-10-CM | POA: Diagnosis not present

## 2021-09-13 DIAGNOSIS — R296 Repeated falls: Secondary | ICD-10-CM | POA: Diagnosis not present

## 2021-09-17 DIAGNOSIS — R32 Unspecified urinary incontinence: Secondary | ICD-10-CM | POA: Diagnosis not present

## 2021-09-17 DIAGNOSIS — F0390 Unspecified dementia without behavioral disturbance: Secondary | ICD-10-CM | POA: Diagnosis not present

## 2021-09-17 DIAGNOSIS — Z8616 Personal history of COVID-19: Secondary | ICD-10-CM | POA: Diagnosis not present

## 2021-09-17 DIAGNOSIS — R296 Repeated falls: Secondary | ICD-10-CM | POA: Diagnosis not present

## 2021-09-17 DIAGNOSIS — I10 Essential (primary) hypertension: Secondary | ICD-10-CM | POA: Diagnosis not present

## 2021-09-17 DIAGNOSIS — I69318 Other symptoms and signs involving cognitive functions following cerebral infarction: Secondary | ICD-10-CM | POA: Diagnosis not present

## 2021-09-18 ENCOUNTER — Ambulatory Visit: Payer: Medicare Other | Admitting: Podiatry

## 2021-09-18 DIAGNOSIS — Z8616 Personal history of COVID-19: Secondary | ICD-10-CM | POA: Diagnosis not present

## 2021-09-18 DIAGNOSIS — R296 Repeated falls: Secondary | ICD-10-CM | POA: Diagnosis not present

## 2021-09-18 DIAGNOSIS — R32 Unspecified urinary incontinence: Secondary | ICD-10-CM | POA: Diagnosis not present

## 2021-09-18 DIAGNOSIS — I10 Essential (primary) hypertension: Secondary | ICD-10-CM | POA: Diagnosis not present

## 2021-09-18 DIAGNOSIS — F0390 Unspecified dementia without behavioral disturbance: Secondary | ICD-10-CM | POA: Diagnosis not present

## 2021-09-18 DIAGNOSIS — I69318 Other symptoms and signs involving cognitive functions following cerebral infarction: Secondary | ICD-10-CM | POA: Diagnosis not present

## 2021-09-20 DIAGNOSIS — I10 Essential (primary) hypertension: Secondary | ICD-10-CM | POA: Diagnosis not present

## 2021-09-20 DIAGNOSIS — R296 Repeated falls: Secondary | ICD-10-CM | POA: Diagnosis not present

## 2021-09-20 DIAGNOSIS — R32 Unspecified urinary incontinence: Secondary | ICD-10-CM | POA: Diagnosis not present

## 2021-09-20 DIAGNOSIS — I69318 Other symptoms and signs involving cognitive functions following cerebral infarction: Secondary | ICD-10-CM | POA: Diagnosis not present

## 2021-09-20 DIAGNOSIS — F0390 Unspecified dementia without behavioral disturbance: Secondary | ICD-10-CM | POA: Diagnosis not present

## 2021-09-20 DIAGNOSIS — Z8616 Personal history of COVID-19: Secondary | ICD-10-CM | POA: Diagnosis not present

## 2021-09-24 DIAGNOSIS — F0390 Unspecified dementia without behavioral disturbance: Secondary | ICD-10-CM | POA: Diagnosis not present

## 2021-09-24 DIAGNOSIS — Z8616 Personal history of COVID-19: Secondary | ICD-10-CM | POA: Diagnosis not present

## 2021-09-24 DIAGNOSIS — I10 Essential (primary) hypertension: Secondary | ICD-10-CM | POA: Diagnosis not present

## 2021-09-24 DIAGNOSIS — I69318 Other symptoms and signs involving cognitive functions following cerebral infarction: Secondary | ICD-10-CM | POA: Diagnosis not present

## 2021-09-24 DIAGNOSIS — R296 Repeated falls: Secondary | ICD-10-CM | POA: Diagnosis not present

## 2021-09-24 DIAGNOSIS — R32 Unspecified urinary incontinence: Secondary | ICD-10-CM | POA: Diagnosis not present

## 2021-09-25 DIAGNOSIS — I69318 Other symptoms and signs involving cognitive functions following cerebral infarction: Secondary | ICD-10-CM | POA: Diagnosis not present

## 2021-09-25 DIAGNOSIS — I10 Essential (primary) hypertension: Secondary | ICD-10-CM | POA: Diagnosis not present

## 2021-09-25 DIAGNOSIS — R32 Unspecified urinary incontinence: Secondary | ICD-10-CM | POA: Diagnosis not present

## 2021-09-25 DIAGNOSIS — Z8616 Personal history of COVID-19: Secondary | ICD-10-CM | POA: Diagnosis not present

## 2021-09-25 DIAGNOSIS — F0390 Unspecified dementia without behavioral disturbance: Secondary | ICD-10-CM | POA: Diagnosis not present

## 2021-09-25 DIAGNOSIS — R296 Repeated falls: Secondary | ICD-10-CM | POA: Diagnosis not present

## 2021-09-27 DIAGNOSIS — I69318 Other symptoms and signs involving cognitive functions following cerebral infarction: Secondary | ICD-10-CM | POA: Diagnosis not present

## 2021-09-27 DIAGNOSIS — R32 Unspecified urinary incontinence: Secondary | ICD-10-CM | POA: Diagnosis not present

## 2021-09-27 DIAGNOSIS — I10 Essential (primary) hypertension: Secondary | ICD-10-CM | POA: Diagnosis not present

## 2021-09-27 DIAGNOSIS — Z8616 Personal history of COVID-19: Secondary | ICD-10-CM | POA: Diagnosis not present

## 2021-09-27 DIAGNOSIS — F0390 Unspecified dementia without behavioral disturbance: Secondary | ICD-10-CM | POA: Diagnosis not present

## 2021-09-27 DIAGNOSIS — R296 Repeated falls: Secondary | ICD-10-CM | POA: Diagnosis not present

## 2021-10-01 DIAGNOSIS — Z8616 Personal history of COVID-19: Secondary | ICD-10-CM | POA: Diagnosis not present

## 2021-10-01 DIAGNOSIS — I69318 Other symptoms and signs involving cognitive functions following cerebral infarction: Secondary | ICD-10-CM | POA: Diagnosis not present

## 2021-10-01 DIAGNOSIS — F0390 Unspecified dementia without behavioral disturbance: Secondary | ICD-10-CM | POA: Diagnosis not present

## 2021-10-01 DIAGNOSIS — R296 Repeated falls: Secondary | ICD-10-CM | POA: Diagnosis not present

## 2021-10-01 DIAGNOSIS — R32 Unspecified urinary incontinence: Secondary | ICD-10-CM | POA: Diagnosis not present

## 2021-10-01 DIAGNOSIS — I10 Essential (primary) hypertension: Secondary | ICD-10-CM | POA: Diagnosis not present

## 2021-10-02 DIAGNOSIS — R32 Unspecified urinary incontinence: Secondary | ICD-10-CM | POA: Diagnosis not present

## 2021-10-02 DIAGNOSIS — F0390 Unspecified dementia without behavioral disturbance: Secondary | ICD-10-CM | POA: Diagnosis not present

## 2021-10-02 DIAGNOSIS — I10 Essential (primary) hypertension: Secondary | ICD-10-CM | POA: Diagnosis not present

## 2021-10-02 DIAGNOSIS — I69318 Other symptoms and signs involving cognitive functions following cerebral infarction: Secondary | ICD-10-CM | POA: Diagnosis not present

## 2021-10-02 DIAGNOSIS — Z8616 Personal history of COVID-19: Secondary | ICD-10-CM | POA: Diagnosis not present

## 2021-10-02 DIAGNOSIS — R296 Repeated falls: Secondary | ICD-10-CM | POA: Diagnosis not present

## 2021-10-04 DIAGNOSIS — F0390 Unspecified dementia without behavioral disturbance: Secondary | ICD-10-CM | POA: Diagnosis not present

## 2021-10-04 DIAGNOSIS — I69318 Other symptoms and signs involving cognitive functions following cerebral infarction: Secondary | ICD-10-CM | POA: Diagnosis not present

## 2021-10-04 DIAGNOSIS — I10 Essential (primary) hypertension: Secondary | ICD-10-CM | POA: Diagnosis not present

## 2021-10-04 DIAGNOSIS — Z8616 Personal history of COVID-19: Secondary | ICD-10-CM | POA: Diagnosis not present

## 2021-10-04 DIAGNOSIS — R32 Unspecified urinary incontinence: Secondary | ICD-10-CM | POA: Diagnosis not present

## 2021-10-04 DIAGNOSIS — R296 Repeated falls: Secondary | ICD-10-CM | POA: Diagnosis not present

## 2021-10-07 DIAGNOSIS — B351 Tinea unguium: Secondary | ICD-10-CM | POA: Diagnosis not present

## 2021-10-07 DIAGNOSIS — L84 Corns and callosities: Secondary | ICD-10-CM | POA: Diagnosis not present

## 2021-10-07 DIAGNOSIS — M2041 Other hammer toe(s) (acquired), right foot: Secondary | ICD-10-CM | POA: Diagnosis not present

## 2021-10-08 DIAGNOSIS — I10 Essential (primary) hypertension: Secondary | ICD-10-CM | POA: Diagnosis not present

## 2021-10-08 DIAGNOSIS — R32 Unspecified urinary incontinence: Secondary | ICD-10-CM | POA: Diagnosis not present

## 2021-10-08 DIAGNOSIS — F0390 Unspecified dementia without behavioral disturbance: Secondary | ICD-10-CM | POA: Diagnosis not present

## 2021-10-08 DIAGNOSIS — R296 Repeated falls: Secondary | ICD-10-CM | POA: Diagnosis not present

## 2021-10-08 DIAGNOSIS — Z8616 Personal history of COVID-19: Secondary | ICD-10-CM | POA: Diagnosis not present

## 2021-10-08 DIAGNOSIS — I69318 Other symptoms and signs involving cognitive functions following cerebral infarction: Secondary | ICD-10-CM | POA: Diagnosis not present

## 2021-10-09 DIAGNOSIS — I10 Essential (primary) hypertension: Secondary | ICD-10-CM | POA: Diagnosis not present

## 2021-10-09 DIAGNOSIS — Z8616 Personal history of COVID-19: Secondary | ICD-10-CM | POA: Diagnosis not present

## 2021-10-09 DIAGNOSIS — I69318 Other symptoms and signs involving cognitive functions following cerebral infarction: Secondary | ICD-10-CM | POA: Diagnosis not present

## 2021-10-09 DIAGNOSIS — F0390 Unspecified dementia without behavioral disturbance: Secondary | ICD-10-CM | POA: Diagnosis not present

## 2021-10-09 DIAGNOSIS — R32 Unspecified urinary incontinence: Secondary | ICD-10-CM | POA: Diagnosis not present

## 2021-10-09 DIAGNOSIS — R296 Repeated falls: Secondary | ICD-10-CM | POA: Diagnosis not present

## 2021-10-11 DIAGNOSIS — I10 Essential (primary) hypertension: Secondary | ICD-10-CM | POA: Diagnosis not present

## 2021-10-11 DIAGNOSIS — R32 Unspecified urinary incontinence: Secondary | ICD-10-CM | POA: Diagnosis not present

## 2021-10-11 DIAGNOSIS — F0390 Unspecified dementia without behavioral disturbance: Secondary | ICD-10-CM | POA: Diagnosis not present

## 2021-10-11 DIAGNOSIS — Z8616 Personal history of COVID-19: Secondary | ICD-10-CM | POA: Diagnosis not present

## 2021-10-11 DIAGNOSIS — R296 Repeated falls: Secondary | ICD-10-CM | POA: Diagnosis not present

## 2021-10-11 DIAGNOSIS — I69318 Other symptoms and signs involving cognitive functions following cerebral infarction: Secondary | ICD-10-CM | POA: Diagnosis not present

## 2021-10-12 DIAGNOSIS — R159 Full incontinence of feces: Secondary | ICD-10-CM | POA: Diagnosis not present

## 2021-10-12 DIAGNOSIS — I10 Essential (primary) hypertension: Secondary | ICD-10-CM | POA: Diagnosis not present

## 2021-10-12 DIAGNOSIS — R296 Repeated falls: Secondary | ICD-10-CM | POA: Diagnosis not present

## 2021-10-12 DIAGNOSIS — R32 Unspecified urinary incontinence: Secondary | ICD-10-CM | POA: Diagnosis not present

## 2021-10-12 DIAGNOSIS — F0390 Unspecified dementia without behavioral disturbance: Secondary | ICD-10-CM | POA: Diagnosis not present

## 2021-10-12 DIAGNOSIS — H3530 Unspecified macular degeneration: Secondary | ICD-10-CM | POA: Diagnosis not present

## 2021-10-12 DIAGNOSIS — I69318 Other symptoms and signs involving cognitive functions following cerebral infarction: Secondary | ICD-10-CM | POA: Diagnosis not present

## 2021-10-12 DIAGNOSIS — L853 Xerosis cutis: Secondary | ICD-10-CM | POA: Diagnosis not present

## 2021-10-12 DIAGNOSIS — Z8616 Personal history of COVID-19: Secondary | ICD-10-CM | POA: Diagnosis not present

## 2021-10-12 DIAGNOSIS — H04129 Dry eye syndrome of unspecified lacrimal gland: Secondary | ICD-10-CM | POA: Diagnosis not present

## 2021-10-15 DIAGNOSIS — R32 Unspecified urinary incontinence: Secondary | ICD-10-CM | POA: Diagnosis not present

## 2021-10-15 DIAGNOSIS — F0390 Unspecified dementia without behavioral disturbance: Secondary | ICD-10-CM | POA: Diagnosis not present

## 2021-10-15 DIAGNOSIS — I10 Essential (primary) hypertension: Secondary | ICD-10-CM | POA: Diagnosis not present

## 2021-10-15 DIAGNOSIS — I69318 Other symptoms and signs involving cognitive functions following cerebral infarction: Secondary | ICD-10-CM | POA: Diagnosis not present

## 2021-10-15 DIAGNOSIS — R296 Repeated falls: Secondary | ICD-10-CM | POA: Diagnosis not present

## 2021-10-15 DIAGNOSIS — Z8616 Personal history of COVID-19: Secondary | ICD-10-CM | POA: Diagnosis not present

## 2021-10-16 DIAGNOSIS — I69318 Other symptoms and signs involving cognitive functions following cerebral infarction: Secondary | ICD-10-CM | POA: Diagnosis not present

## 2021-10-16 DIAGNOSIS — I10 Essential (primary) hypertension: Secondary | ICD-10-CM | POA: Diagnosis not present

## 2021-10-16 DIAGNOSIS — R32 Unspecified urinary incontinence: Secondary | ICD-10-CM | POA: Diagnosis not present

## 2021-10-16 DIAGNOSIS — R296 Repeated falls: Secondary | ICD-10-CM | POA: Diagnosis not present

## 2021-10-16 DIAGNOSIS — Z8616 Personal history of COVID-19: Secondary | ICD-10-CM | POA: Diagnosis not present

## 2021-10-16 DIAGNOSIS — F0390 Unspecified dementia without behavioral disturbance: Secondary | ICD-10-CM | POA: Diagnosis not present

## 2021-10-18 DIAGNOSIS — I10 Essential (primary) hypertension: Secondary | ICD-10-CM | POA: Diagnosis not present

## 2021-10-18 DIAGNOSIS — I69318 Other symptoms and signs involving cognitive functions following cerebral infarction: Secondary | ICD-10-CM | POA: Diagnosis not present

## 2021-10-18 DIAGNOSIS — Z8616 Personal history of COVID-19: Secondary | ICD-10-CM | POA: Diagnosis not present

## 2021-10-18 DIAGNOSIS — R32 Unspecified urinary incontinence: Secondary | ICD-10-CM | POA: Diagnosis not present

## 2021-10-18 DIAGNOSIS — R296 Repeated falls: Secondary | ICD-10-CM | POA: Diagnosis not present

## 2021-10-18 DIAGNOSIS — F0390 Unspecified dementia without behavioral disturbance: Secondary | ICD-10-CM | POA: Diagnosis not present

## 2021-10-22 DIAGNOSIS — Z8616 Personal history of COVID-19: Secondary | ICD-10-CM | POA: Diagnosis not present

## 2021-10-22 DIAGNOSIS — I69318 Other symptoms and signs involving cognitive functions following cerebral infarction: Secondary | ICD-10-CM | POA: Diagnosis not present

## 2021-10-22 DIAGNOSIS — R32 Unspecified urinary incontinence: Secondary | ICD-10-CM | POA: Diagnosis not present

## 2021-10-22 DIAGNOSIS — R296 Repeated falls: Secondary | ICD-10-CM | POA: Diagnosis not present

## 2021-10-22 DIAGNOSIS — F0390 Unspecified dementia without behavioral disturbance: Secondary | ICD-10-CM | POA: Diagnosis not present

## 2021-10-22 DIAGNOSIS — I10 Essential (primary) hypertension: Secondary | ICD-10-CM | POA: Diagnosis not present

## 2021-10-23 DIAGNOSIS — R296 Repeated falls: Secondary | ICD-10-CM | POA: Diagnosis not present

## 2021-10-23 DIAGNOSIS — I10 Essential (primary) hypertension: Secondary | ICD-10-CM | POA: Diagnosis not present

## 2021-10-23 DIAGNOSIS — R32 Unspecified urinary incontinence: Secondary | ICD-10-CM | POA: Diagnosis not present

## 2021-10-23 DIAGNOSIS — Z8616 Personal history of COVID-19: Secondary | ICD-10-CM | POA: Diagnosis not present

## 2021-10-23 DIAGNOSIS — F0390 Unspecified dementia without behavioral disturbance: Secondary | ICD-10-CM | POA: Diagnosis not present

## 2021-10-23 DIAGNOSIS — I69318 Other symptoms and signs involving cognitive functions following cerebral infarction: Secondary | ICD-10-CM | POA: Diagnosis not present

## 2021-10-25 DIAGNOSIS — I10 Essential (primary) hypertension: Secondary | ICD-10-CM | POA: Diagnosis not present

## 2021-10-25 DIAGNOSIS — F0390 Unspecified dementia without behavioral disturbance: Secondary | ICD-10-CM | POA: Diagnosis not present

## 2021-10-25 DIAGNOSIS — R296 Repeated falls: Secondary | ICD-10-CM | POA: Diagnosis not present

## 2021-10-25 DIAGNOSIS — Z8616 Personal history of COVID-19: Secondary | ICD-10-CM | POA: Diagnosis not present

## 2021-10-25 DIAGNOSIS — I69318 Other symptoms and signs involving cognitive functions following cerebral infarction: Secondary | ICD-10-CM | POA: Diagnosis not present

## 2021-10-25 DIAGNOSIS — R32 Unspecified urinary incontinence: Secondary | ICD-10-CM | POA: Diagnosis not present

## 2021-10-28 DIAGNOSIS — I10 Essential (primary) hypertension: Secondary | ICD-10-CM | POA: Diagnosis not present

## 2021-10-28 DIAGNOSIS — R296 Repeated falls: Secondary | ICD-10-CM | POA: Diagnosis not present

## 2021-10-28 DIAGNOSIS — R32 Unspecified urinary incontinence: Secondary | ICD-10-CM | POA: Diagnosis not present

## 2021-10-28 DIAGNOSIS — F0390 Unspecified dementia without behavioral disturbance: Secondary | ICD-10-CM | POA: Diagnosis not present

## 2021-10-28 DIAGNOSIS — I69318 Other symptoms and signs involving cognitive functions following cerebral infarction: Secondary | ICD-10-CM | POA: Diagnosis not present

## 2021-10-28 DIAGNOSIS — Z8616 Personal history of COVID-19: Secondary | ICD-10-CM | POA: Diagnosis not present

## 2021-11-01 DIAGNOSIS — R296 Repeated falls: Secondary | ICD-10-CM | POA: Diagnosis not present

## 2021-11-01 DIAGNOSIS — F0390 Unspecified dementia without behavioral disturbance: Secondary | ICD-10-CM | POA: Diagnosis not present

## 2021-11-01 DIAGNOSIS — I10 Essential (primary) hypertension: Secondary | ICD-10-CM | POA: Diagnosis not present

## 2021-11-01 DIAGNOSIS — R32 Unspecified urinary incontinence: Secondary | ICD-10-CM | POA: Diagnosis not present

## 2021-11-01 DIAGNOSIS — Z8616 Personal history of COVID-19: Secondary | ICD-10-CM | POA: Diagnosis not present

## 2021-11-01 DIAGNOSIS — I69318 Other symptoms and signs involving cognitive functions following cerebral infarction: Secondary | ICD-10-CM | POA: Diagnosis not present

## 2021-11-04 DIAGNOSIS — R296 Repeated falls: Secondary | ICD-10-CM | POA: Diagnosis not present

## 2021-11-04 DIAGNOSIS — F0390 Unspecified dementia without behavioral disturbance: Secondary | ICD-10-CM | POA: Diagnosis not present

## 2021-11-04 DIAGNOSIS — R32 Unspecified urinary incontinence: Secondary | ICD-10-CM | POA: Diagnosis not present

## 2021-11-04 DIAGNOSIS — Z8616 Personal history of COVID-19: Secondary | ICD-10-CM | POA: Diagnosis not present

## 2021-11-04 DIAGNOSIS — I69318 Other symptoms and signs involving cognitive functions following cerebral infarction: Secondary | ICD-10-CM | POA: Diagnosis not present

## 2021-11-04 DIAGNOSIS — I10 Essential (primary) hypertension: Secondary | ICD-10-CM | POA: Diagnosis not present

## 2021-11-06 DIAGNOSIS — Z8616 Personal history of COVID-19: Secondary | ICD-10-CM | POA: Diagnosis not present

## 2021-11-06 DIAGNOSIS — I10 Essential (primary) hypertension: Secondary | ICD-10-CM | POA: Diagnosis not present

## 2021-11-06 DIAGNOSIS — R32 Unspecified urinary incontinence: Secondary | ICD-10-CM | POA: Diagnosis not present

## 2021-11-06 DIAGNOSIS — R296 Repeated falls: Secondary | ICD-10-CM | POA: Diagnosis not present

## 2021-11-06 DIAGNOSIS — F0390 Unspecified dementia without behavioral disturbance: Secondary | ICD-10-CM | POA: Diagnosis not present

## 2021-11-06 DIAGNOSIS — I69318 Other symptoms and signs involving cognitive functions following cerebral infarction: Secondary | ICD-10-CM | POA: Diagnosis not present

## 2021-11-08 DIAGNOSIS — I10 Essential (primary) hypertension: Secondary | ICD-10-CM | POA: Diagnosis not present

## 2021-11-08 DIAGNOSIS — Z8616 Personal history of COVID-19: Secondary | ICD-10-CM | POA: Diagnosis not present

## 2021-11-08 DIAGNOSIS — F0390 Unspecified dementia without behavioral disturbance: Secondary | ICD-10-CM | POA: Diagnosis not present

## 2021-11-08 DIAGNOSIS — I69318 Other symptoms and signs involving cognitive functions following cerebral infarction: Secondary | ICD-10-CM | POA: Diagnosis not present

## 2021-11-08 DIAGNOSIS — R32 Unspecified urinary incontinence: Secondary | ICD-10-CM | POA: Diagnosis not present

## 2021-11-08 DIAGNOSIS — R296 Repeated falls: Secondary | ICD-10-CM | POA: Diagnosis not present

## 2021-11-11 DIAGNOSIS — Z8616 Personal history of COVID-19: Secondary | ICD-10-CM | POA: Diagnosis not present

## 2021-11-11 DIAGNOSIS — I10 Essential (primary) hypertension: Secondary | ICD-10-CM | POA: Diagnosis not present

## 2021-11-11 DIAGNOSIS — I69318 Other symptoms and signs involving cognitive functions following cerebral infarction: Secondary | ICD-10-CM | POA: Diagnosis not present

## 2021-11-11 DIAGNOSIS — R296 Repeated falls: Secondary | ICD-10-CM | POA: Diagnosis not present

## 2021-11-11 DIAGNOSIS — R32 Unspecified urinary incontinence: Secondary | ICD-10-CM | POA: Diagnosis not present

## 2021-11-11 DIAGNOSIS — F0390 Unspecified dementia without behavioral disturbance: Secondary | ICD-10-CM | POA: Diagnosis not present

## 2021-11-12 DIAGNOSIS — Z8616 Personal history of COVID-19: Secondary | ICD-10-CM | POA: Diagnosis not present

## 2021-11-12 DIAGNOSIS — L853 Xerosis cutis: Secondary | ICD-10-CM | POA: Diagnosis not present

## 2021-11-12 DIAGNOSIS — Z7401 Bed confinement status: Secondary | ICD-10-CM | POA: Diagnosis not present

## 2021-11-12 DIAGNOSIS — Z741 Need for assistance with personal care: Secondary | ICD-10-CM | POA: Diagnosis not present

## 2021-11-12 DIAGNOSIS — R296 Repeated falls: Secondary | ICD-10-CM | POA: Diagnosis not present

## 2021-11-12 DIAGNOSIS — R159 Full incontinence of feces: Secondary | ICD-10-CM | POA: Diagnosis not present

## 2021-11-12 DIAGNOSIS — F0390 Unspecified dementia without behavioral disturbance: Secondary | ICD-10-CM | POA: Diagnosis not present

## 2021-11-12 DIAGNOSIS — I69318 Other symptoms and signs involving cognitive functions following cerebral infarction: Secondary | ICD-10-CM | POA: Diagnosis not present

## 2021-11-12 DIAGNOSIS — I10 Essential (primary) hypertension: Secondary | ICD-10-CM | POA: Diagnosis not present

## 2021-11-12 DIAGNOSIS — H04129 Dry eye syndrome of unspecified lacrimal gland: Secondary | ICD-10-CM | POA: Diagnosis not present

## 2021-11-12 DIAGNOSIS — Z681 Body mass index (BMI) 19 or less, adult: Secondary | ICD-10-CM | POA: Diagnosis not present

## 2021-11-12 DIAGNOSIS — H3530 Unspecified macular degeneration: Secondary | ICD-10-CM | POA: Diagnosis not present

## 2021-11-12 DIAGNOSIS — R32 Unspecified urinary incontinence: Secondary | ICD-10-CM | POA: Diagnosis not present

## 2021-11-13 DIAGNOSIS — I69318 Other symptoms and signs involving cognitive functions following cerebral infarction: Secondary | ICD-10-CM | POA: Diagnosis not present

## 2021-11-13 DIAGNOSIS — R32 Unspecified urinary incontinence: Secondary | ICD-10-CM | POA: Diagnosis not present

## 2021-11-13 DIAGNOSIS — F0390 Unspecified dementia without behavioral disturbance: Secondary | ICD-10-CM | POA: Diagnosis not present

## 2021-11-13 DIAGNOSIS — Z8616 Personal history of COVID-19: Secondary | ICD-10-CM | POA: Diagnosis not present

## 2021-11-13 DIAGNOSIS — R296 Repeated falls: Secondary | ICD-10-CM | POA: Diagnosis not present

## 2021-11-13 DIAGNOSIS — I10 Essential (primary) hypertension: Secondary | ICD-10-CM | POA: Diagnosis not present

## 2021-11-15 DIAGNOSIS — R32 Unspecified urinary incontinence: Secondary | ICD-10-CM | POA: Diagnosis not present

## 2021-11-15 DIAGNOSIS — Z8616 Personal history of COVID-19: Secondary | ICD-10-CM | POA: Diagnosis not present

## 2021-11-15 DIAGNOSIS — I69318 Other symptoms and signs involving cognitive functions following cerebral infarction: Secondary | ICD-10-CM | POA: Diagnosis not present

## 2021-11-15 DIAGNOSIS — R296 Repeated falls: Secondary | ICD-10-CM | POA: Diagnosis not present

## 2021-11-15 DIAGNOSIS — I10 Essential (primary) hypertension: Secondary | ICD-10-CM | POA: Diagnosis not present

## 2021-11-15 DIAGNOSIS — F0390 Unspecified dementia without behavioral disturbance: Secondary | ICD-10-CM | POA: Diagnosis not present

## 2021-11-18 DIAGNOSIS — R32 Unspecified urinary incontinence: Secondary | ICD-10-CM | POA: Diagnosis not present

## 2021-11-18 DIAGNOSIS — F0390 Unspecified dementia without behavioral disturbance: Secondary | ICD-10-CM | POA: Diagnosis not present

## 2021-11-18 DIAGNOSIS — Z8616 Personal history of COVID-19: Secondary | ICD-10-CM | POA: Diagnosis not present

## 2021-11-18 DIAGNOSIS — R296 Repeated falls: Secondary | ICD-10-CM | POA: Diagnosis not present

## 2021-11-18 DIAGNOSIS — I69318 Other symptoms and signs involving cognitive functions following cerebral infarction: Secondary | ICD-10-CM | POA: Diagnosis not present

## 2021-11-18 DIAGNOSIS — I10 Essential (primary) hypertension: Secondary | ICD-10-CM | POA: Diagnosis not present

## 2021-11-19 DIAGNOSIS — Z8616 Personal history of COVID-19: Secondary | ICD-10-CM | POA: Diagnosis not present

## 2021-11-19 DIAGNOSIS — R32 Unspecified urinary incontinence: Secondary | ICD-10-CM | POA: Diagnosis not present

## 2021-11-19 DIAGNOSIS — R296 Repeated falls: Secondary | ICD-10-CM | POA: Diagnosis not present

## 2021-11-19 DIAGNOSIS — I10 Essential (primary) hypertension: Secondary | ICD-10-CM | POA: Diagnosis not present

## 2021-11-19 DIAGNOSIS — I69318 Other symptoms and signs involving cognitive functions following cerebral infarction: Secondary | ICD-10-CM | POA: Diagnosis not present

## 2021-11-19 DIAGNOSIS — F0390 Unspecified dementia without behavioral disturbance: Secondary | ICD-10-CM | POA: Diagnosis not present

## 2021-11-20 DIAGNOSIS — I69318 Other symptoms and signs involving cognitive functions following cerebral infarction: Secondary | ICD-10-CM | POA: Diagnosis not present

## 2021-11-20 DIAGNOSIS — F0390 Unspecified dementia without behavioral disturbance: Secondary | ICD-10-CM | POA: Diagnosis not present

## 2021-11-20 DIAGNOSIS — Z8616 Personal history of COVID-19: Secondary | ICD-10-CM | POA: Diagnosis not present

## 2021-11-20 DIAGNOSIS — R32 Unspecified urinary incontinence: Secondary | ICD-10-CM | POA: Diagnosis not present

## 2021-11-20 DIAGNOSIS — I10 Essential (primary) hypertension: Secondary | ICD-10-CM | POA: Diagnosis not present

## 2021-11-20 DIAGNOSIS — R296 Repeated falls: Secondary | ICD-10-CM | POA: Diagnosis not present

## 2021-11-22 DIAGNOSIS — I10 Essential (primary) hypertension: Secondary | ICD-10-CM | POA: Diagnosis not present

## 2021-11-22 DIAGNOSIS — F0390 Unspecified dementia without behavioral disturbance: Secondary | ICD-10-CM | POA: Diagnosis not present

## 2021-11-22 DIAGNOSIS — R296 Repeated falls: Secondary | ICD-10-CM | POA: Diagnosis not present

## 2021-11-22 DIAGNOSIS — I69318 Other symptoms and signs involving cognitive functions following cerebral infarction: Secondary | ICD-10-CM | POA: Diagnosis not present

## 2021-11-22 DIAGNOSIS — R32 Unspecified urinary incontinence: Secondary | ICD-10-CM | POA: Diagnosis not present

## 2021-11-22 DIAGNOSIS — Z8616 Personal history of COVID-19: Secondary | ICD-10-CM | POA: Diagnosis not present

## 2021-11-25 DIAGNOSIS — F0390 Unspecified dementia without behavioral disturbance: Secondary | ICD-10-CM | POA: Diagnosis not present

## 2021-11-25 DIAGNOSIS — I10 Essential (primary) hypertension: Secondary | ICD-10-CM | POA: Diagnosis not present

## 2021-11-25 DIAGNOSIS — I69318 Other symptoms and signs involving cognitive functions following cerebral infarction: Secondary | ICD-10-CM | POA: Diagnosis not present

## 2021-11-25 DIAGNOSIS — Z8616 Personal history of COVID-19: Secondary | ICD-10-CM | POA: Diagnosis not present

## 2021-11-25 DIAGNOSIS — R32 Unspecified urinary incontinence: Secondary | ICD-10-CM | POA: Diagnosis not present

## 2021-11-25 DIAGNOSIS — R296 Repeated falls: Secondary | ICD-10-CM | POA: Diagnosis not present

## 2021-11-27 DIAGNOSIS — F0390 Unspecified dementia without behavioral disturbance: Secondary | ICD-10-CM | POA: Diagnosis not present

## 2021-11-27 DIAGNOSIS — Z8616 Personal history of COVID-19: Secondary | ICD-10-CM | POA: Diagnosis not present

## 2021-11-27 DIAGNOSIS — R296 Repeated falls: Secondary | ICD-10-CM | POA: Diagnosis not present

## 2021-11-27 DIAGNOSIS — I10 Essential (primary) hypertension: Secondary | ICD-10-CM | POA: Diagnosis not present

## 2021-11-27 DIAGNOSIS — R32 Unspecified urinary incontinence: Secondary | ICD-10-CM | POA: Diagnosis not present

## 2021-11-27 DIAGNOSIS — I69318 Other symptoms and signs involving cognitive functions following cerebral infarction: Secondary | ICD-10-CM | POA: Diagnosis not present

## 2021-11-29 DIAGNOSIS — I69318 Other symptoms and signs involving cognitive functions following cerebral infarction: Secondary | ICD-10-CM | POA: Diagnosis not present

## 2021-11-29 DIAGNOSIS — F0390 Unspecified dementia without behavioral disturbance: Secondary | ICD-10-CM | POA: Diagnosis not present

## 2021-11-29 DIAGNOSIS — R296 Repeated falls: Secondary | ICD-10-CM | POA: Diagnosis not present

## 2021-11-29 DIAGNOSIS — I10 Essential (primary) hypertension: Secondary | ICD-10-CM | POA: Diagnosis not present

## 2021-11-29 DIAGNOSIS — Z8616 Personal history of COVID-19: Secondary | ICD-10-CM | POA: Diagnosis not present

## 2021-11-29 DIAGNOSIS — R32 Unspecified urinary incontinence: Secondary | ICD-10-CM | POA: Diagnosis not present

## 2021-12-02 DIAGNOSIS — I10 Essential (primary) hypertension: Secondary | ICD-10-CM | POA: Diagnosis not present

## 2021-12-02 DIAGNOSIS — R32 Unspecified urinary incontinence: Secondary | ICD-10-CM | POA: Diagnosis not present

## 2021-12-02 DIAGNOSIS — Z8616 Personal history of COVID-19: Secondary | ICD-10-CM | POA: Diagnosis not present

## 2021-12-02 DIAGNOSIS — I69318 Other symptoms and signs involving cognitive functions following cerebral infarction: Secondary | ICD-10-CM | POA: Diagnosis not present

## 2021-12-02 DIAGNOSIS — R296 Repeated falls: Secondary | ICD-10-CM | POA: Diagnosis not present

## 2021-12-02 DIAGNOSIS — F0390 Unspecified dementia without behavioral disturbance: Secondary | ICD-10-CM | POA: Diagnosis not present

## 2021-12-03 DIAGNOSIS — F0390 Unspecified dementia without behavioral disturbance: Secondary | ICD-10-CM | POA: Diagnosis not present

## 2021-12-03 DIAGNOSIS — R32 Unspecified urinary incontinence: Secondary | ICD-10-CM | POA: Diagnosis not present

## 2021-12-03 DIAGNOSIS — I69318 Other symptoms and signs involving cognitive functions following cerebral infarction: Secondary | ICD-10-CM | POA: Diagnosis not present

## 2021-12-03 DIAGNOSIS — I10 Essential (primary) hypertension: Secondary | ICD-10-CM | POA: Diagnosis not present

## 2021-12-03 DIAGNOSIS — R296 Repeated falls: Secondary | ICD-10-CM | POA: Diagnosis not present

## 2021-12-03 DIAGNOSIS — Z8616 Personal history of COVID-19: Secondary | ICD-10-CM | POA: Diagnosis not present

## 2021-12-04 DIAGNOSIS — R296 Repeated falls: Secondary | ICD-10-CM | POA: Diagnosis not present

## 2021-12-04 DIAGNOSIS — R32 Unspecified urinary incontinence: Secondary | ICD-10-CM | POA: Diagnosis not present

## 2021-12-04 DIAGNOSIS — Z8616 Personal history of COVID-19: Secondary | ICD-10-CM | POA: Diagnosis not present

## 2021-12-04 DIAGNOSIS — I10 Essential (primary) hypertension: Secondary | ICD-10-CM | POA: Diagnosis not present

## 2021-12-04 DIAGNOSIS — I69318 Other symptoms and signs involving cognitive functions following cerebral infarction: Secondary | ICD-10-CM | POA: Diagnosis not present

## 2021-12-04 DIAGNOSIS — F0390 Unspecified dementia without behavioral disturbance: Secondary | ICD-10-CM | POA: Diagnosis not present

## 2021-12-06 DIAGNOSIS — Z8616 Personal history of COVID-19: Secondary | ICD-10-CM | POA: Diagnosis not present

## 2021-12-06 DIAGNOSIS — F0390 Unspecified dementia without behavioral disturbance: Secondary | ICD-10-CM | POA: Diagnosis not present

## 2021-12-06 DIAGNOSIS — R296 Repeated falls: Secondary | ICD-10-CM | POA: Diagnosis not present

## 2021-12-06 DIAGNOSIS — I10 Essential (primary) hypertension: Secondary | ICD-10-CM | POA: Diagnosis not present

## 2021-12-06 DIAGNOSIS — R32 Unspecified urinary incontinence: Secondary | ICD-10-CM | POA: Diagnosis not present

## 2021-12-06 DIAGNOSIS — I69318 Other symptoms and signs involving cognitive functions following cerebral infarction: Secondary | ICD-10-CM | POA: Diagnosis not present

## 2021-12-09 DIAGNOSIS — I69318 Other symptoms and signs involving cognitive functions following cerebral infarction: Secondary | ICD-10-CM | POA: Diagnosis not present

## 2021-12-09 DIAGNOSIS — R32 Unspecified urinary incontinence: Secondary | ICD-10-CM | POA: Diagnosis not present

## 2021-12-09 DIAGNOSIS — R296 Repeated falls: Secondary | ICD-10-CM | POA: Diagnosis not present

## 2021-12-09 DIAGNOSIS — F0390 Unspecified dementia without behavioral disturbance: Secondary | ICD-10-CM | POA: Diagnosis not present

## 2021-12-09 DIAGNOSIS — Z8616 Personal history of COVID-19: Secondary | ICD-10-CM | POA: Diagnosis not present

## 2021-12-09 DIAGNOSIS — I10 Essential (primary) hypertension: Secondary | ICD-10-CM | POA: Diagnosis not present

## 2021-12-11 DIAGNOSIS — Z8616 Personal history of COVID-19: Secondary | ICD-10-CM | POA: Diagnosis not present

## 2021-12-11 DIAGNOSIS — R296 Repeated falls: Secondary | ICD-10-CM | POA: Diagnosis not present

## 2021-12-11 DIAGNOSIS — R32 Unspecified urinary incontinence: Secondary | ICD-10-CM | POA: Diagnosis not present

## 2021-12-11 DIAGNOSIS — F0390 Unspecified dementia without behavioral disturbance: Secondary | ICD-10-CM | POA: Diagnosis not present

## 2021-12-11 DIAGNOSIS — I10 Essential (primary) hypertension: Secondary | ICD-10-CM | POA: Diagnosis not present

## 2021-12-11 DIAGNOSIS — I69318 Other symptoms and signs involving cognitive functions following cerebral infarction: Secondary | ICD-10-CM | POA: Diagnosis not present

## 2021-12-13 DIAGNOSIS — F0390 Unspecified dementia without behavioral disturbance: Secondary | ICD-10-CM | POA: Diagnosis not present

## 2021-12-13 DIAGNOSIS — H04129 Dry eye syndrome of unspecified lacrimal gland: Secondary | ICD-10-CM | POA: Diagnosis not present

## 2021-12-13 DIAGNOSIS — L853 Xerosis cutis: Secondary | ICD-10-CM | POA: Diagnosis not present

## 2021-12-13 DIAGNOSIS — Z7401 Bed confinement status: Secondary | ICD-10-CM | POA: Diagnosis not present

## 2021-12-13 DIAGNOSIS — R296 Repeated falls: Secondary | ICD-10-CM | POA: Diagnosis not present

## 2021-12-13 DIAGNOSIS — I69318 Other symptoms and signs involving cognitive functions following cerebral infarction: Secondary | ICD-10-CM | POA: Diagnosis not present

## 2021-12-13 DIAGNOSIS — H3530 Unspecified macular degeneration: Secondary | ICD-10-CM | POA: Diagnosis not present

## 2021-12-13 DIAGNOSIS — R32 Unspecified urinary incontinence: Secondary | ICD-10-CM | POA: Diagnosis not present

## 2021-12-13 DIAGNOSIS — R159 Full incontinence of feces: Secondary | ICD-10-CM | POA: Diagnosis not present

## 2021-12-13 DIAGNOSIS — Z741 Need for assistance with personal care: Secondary | ICD-10-CM | POA: Diagnosis not present

## 2021-12-13 DIAGNOSIS — Z681 Body mass index (BMI) 19 or less, adult: Secondary | ICD-10-CM | POA: Diagnosis not present

## 2021-12-13 DIAGNOSIS — I10 Essential (primary) hypertension: Secondary | ICD-10-CM | POA: Diagnosis not present

## 2021-12-13 DIAGNOSIS — Z8616 Personal history of COVID-19: Secondary | ICD-10-CM | POA: Diagnosis not present

## 2021-12-16 ENCOUNTER — Other Ambulatory Visit: Payer: Self-pay

## 2021-12-16 ENCOUNTER — Emergency Department (HOSPITAL_COMMUNITY)

## 2021-12-16 ENCOUNTER — Encounter (HOSPITAL_COMMUNITY): Payer: Self-pay

## 2021-12-16 ENCOUNTER — Emergency Department (HOSPITAL_COMMUNITY)
Admission: EM | Admit: 2021-12-16 | Discharge: 2021-12-16 | Disposition: A | Discharge status: Skilled Nursing Facility | Attending: Emergency Medicine | Admitting: Emergency Medicine

## 2021-12-16 DIAGNOSIS — I10 Essential (primary) hypertension: Secondary | ICD-10-CM | POA: Insufficient documentation

## 2021-12-16 DIAGNOSIS — G309 Alzheimer's disease, unspecified: Secondary | ICD-10-CM | POA: Insufficient documentation

## 2021-12-16 DIAGNOSIS — R4020 Unspecified coma: Secondary | ICD-10-CM | POA: Diagnosis not present

## 2021-12-16 DIAGNOSIS — R404 Transient alteration of awareness: Secondary | ICD-10-CM | POA: Insufficient documentation

## 2021-12-16 DIAGNOSIS — N3000 Acute cystitis without hematuria: Secondary | ICD-10-CM | POA: Insufficient documentation

## 2021-12-16 DIAGNOSIS — Z7401 Bed confinement status: Secondary | ICD-10-CM | POA: Diagnosis not present

## 2021-12-16 DIAGNOSIS — R4182 Altered mental status, unspecified: Secondary | ICD-10-CM | POA: Diagnosis not present

## 2021-12-16 DIAGNOSIS — M79605 Pain in left leg: Secondary | ICD-10-CM | POA: Diagnosis not present

## 2021-12-16 DIAGNOSIS — I959 Hypotension, unspecified: Secondary | ICD-10-CM | POA: Diagnosis not present

## 2021-12-16 DIAGNOSIS — R55 Syncope and collapse: Secondary | ICD-10-CM | POA: Diagnosis not present

## 2021-12-16 LAB — CBC
HCT: 36.8 % (ref 36.0–46.0)
Hemoglobin: 12.1 g/dL (ref 12.0–15.0)
MCH: 33.3 pg (ref 26.0–34.0)
MCHC: 32.9 g/dL (ref 30.0–36.0)
MCV: 101.4 fL — ABNORMAL HIGH (ref 80.0–100.0)
Platelets: 221 10*3/uL (ref 150–400)
RBC: 3.63 MIL/uL — ABNORMAL LOW (ref 3.87–5.11)
RDW: 11.9 % (ref 11.5–15.5)
WBC: 13.1 10*3/uL — ABNORMAL HIGH (ref 4.0–10.5)
nRBC: 0 % (ref 0.0–0.2)

## 2021-12-16 LAB — DIFFERENTIAL
Abs Immature Granulocytes: 0.07 10*3/uL (ref 0.00–0.07)
Basophils Absolute: 0 10*3/uL (ref 0.0–0.1)
Basophils Relative: 0 %
Eosinophils Absolute: 0 10*3/uL (ref 0.0–0.5)
Eosinophils Relative: 0 %
Immature Granulocytes: 1 %
Lymphocytes Relative: 7 %
Lymphs Abs: 0.9 10*3/uL (ref 0.7–4.0)
Monocytes Absolute: 0.8 10*3/uL (ref 0.1–1.0)
Monocytes Relative: 6 %
Neutro Abs: 11.4 10*3/uL — ABNORMAL HIGH (ref 1.7–7.7)
Neutrophils Relative %: 86 %

## 2021-12-16 LAB — URINALYSIS, ROUTINE W REFLEX MICROSCOPIC
Bilirubin Urine: NEGATIVE
Glucose, UA: NEGATIVE mg/dL
Ketones, ur: NEGATIVE mg/dL
Nitrite: POSITIVE — AB
Protein, ur: 100 mg/dL — AB
Specific Gravity, Urine: 1.021 (ref 1.005–1.030)
WBC, UA: >50 WBC/hpf — ABNORMAL HIGH (ref 0–5)
pH: 5 (ref 5.0–8.0)

## 2021-12-16 LAB — I-STAT CHEM 8, ED
BUN: 25 mg/dL — ABNORMAL HIGH (ref 8–23)
Calcium, Ion: 1.15 mmol/L (ref 1.15–1.40)
Chloride: 109 mmol/L (ref 98–111)
Creatinine, Ser: 1 mg/dL (ref 0.44–1.00)
Glucose, Bld: 133 mg/dL — ABNORMAL HIGH (ref 70–99)
HCT: 37 % (ref 36.0–46.0)
Hemoglobin: 12.6 g/dL (ref 12.0–15.0)
Potassium: 3.9 mmol/L (ref 3.5–5.1)
Sodium: 139 mmol/L (ref 135–145)
TCO2: 21 mmol/L — ABNORMAL LOW (ref 22–32)

## 2021-12-16 LAB — TROPONIN I (HIGH SENSITIVITY)
Troponin I (High Sensitivity): 10 ng/L (ref <18)
Troponin I (High Sensitivity): 13 ng/L (ref <18)

## 2021-12-16 MED ORDER — CEFUROXIME AXETIL 250 MG PO TABS: 250.0000 mg | ORAL_TABLET | Freq: Two times a day (BID) | ORAL | 0 refills | Status: AC

## 2021-12-16 MED ORDER — CEFUROXIME AXETIL 250 MG PO TABS
250.0000 mg | ORAL_TABLET | Freq: Two times a day (BID) | ORAL | 0 refills | Status: DC
Start: 2021-12-16 — End: 2021-12-16

## 2021-12-16 MED ORDER — SODIUM CHLORIDE 0.9 % IV SOLN
100.0000 mL/h | INTRAVENOUS | Status: DC
  Administered 2021-12-16: 100 mL/h via INTRAVENOUS

## 2021-12-16 MED ORDER — SODIUM CHLORIDE 0.9 % IV BOLUS
500.0000 mL | Freq: Once | INTRAVENOUS | Status: AC
Start: 2021-12-16 — End: 2021-12-16
  Administered 2021-12-16: 500 mL via INTRAVENOUS

## 2021-12-16 MED ORDER — SODIUM CHLORIDE 0.9 % IV SOLN
1.0000 g | Freq: Once | INTRAVENOUS | Status: AC
  Administered 2021-12-16: 1 g via INTRAVENOUS
  Filled 2021-12-16: qty 10

## 2021-12-16 NOTE — ED Notes (Signed)
Family at bedside; per family, pt is mentating at baseline

## 2021-12-16 NOTE — ED Notes (Signed)
Pt offered juice while waiting .

## 2021-12-16 NOTE — Progress Notes (Signed)
Manufacturing engineer [facility])  Ms. Orrison is a current hospice patient with [facility].  Visited at the bedside, daughter and SIL at the bedside.  Per family, plan to likely d/c back to facility.  Please use [facility] for EMS transport, as they contract this service for our active hospice patients.  Thank you, Venia Carbon DNP, RN [facility] Liaison

## 2021-12-16 NOTE — Discharge Instructions (Signed)
Take the antibiotics as prescribed treat the urinary tract infection.  Follow-up with your doctor next week to be rechecked to make sure you are improving.  Return to the ED as needed for fevers chills or other concerning symptoms

## 2021-12-16 NOTE — ED Triage Notes (Signed)
Pt bib ems from [facility]; lkw 0730 this am; hx dementia, hospice pt; eating in dining room, became unresponsive, responsive to shaking; family sent pt for evaluation; pt states she "doesn't feel good"; c/o left leg pain, no know injury, not alert now; 95/50 initially, 200 mlfluid given pta, 125/50, p 71, 100% RA, cbg 160, 20 ga lfa; hx tia, R sided weakness at baseline

## 2021-12-16 NOTE — ED Provider Notes (Signed)
[facility] EMERGENCY DEPARTMENT Provider Note   CSN: [id number] Arrival date & time: 12/16/21  0844     History  Chief complaint: Episode of unresponsiveness  Kimberly Ramirez is a 88 y.o. female.  HPI   Patient has a history of TIA, right hip fracture, hypertension, Alzheimer's disease.  She is currently a resident of a nursing facility.  Patient presents to the ED for evaluation after an episode of unresponsiveness at the breakfast table.  According to the EMS report the patient was sitting at the breakfast table.  She suddenly slumped over.  Patient did not fall out of her chair.  Staff at the facility had to shake her a few times and then she regained alertness.  Patient was sent to the ED for further evaluation.  Patient is confused and states she was getting ready for work this morning and just did not quite feel right.  She is not having any complaints of chest pain or shortness of breath.  She is not having abdominal pain.  She denies any focal weakness.  She is not having a headache  Home Medications Prior to Admission medications   Medication Sig Start Date End Date Taking? Authorizing Provider  acetaminophen (TYLENOL) 500 MG tablet Take 1,000 mg by mouth every 8 (eight) hours.   Yes [provider]  Emollient Jilda Panda) LOTN Apply 1 application. topically 3 (three) times daily as needed (dry or itchy skin).   Yes [provider]  guaiFENesin (ROBITUSSIN) 100 MG/5ML liquid Take 200 mg by mouth every 4 (four) hours as needed for cough.   Yes [provider]  ketoconazole (NIZORAL) 2 % cream Apply 1 Application topically in the morning and at bedtime. To vaginal and groin area 12/12/21 12/19/21 Yes [provider]  liver oil-zinc oxide (DESITIN) 40 % ointment Apply 1 application. topically as needed for irritation. 06/25/21  Yes Regan Lemming, MD  Nutritional Supplements (ENSURE NUTRITION SHAKE) LIQD Take 0.5 Bottles by mouth in the  morning and at bedtime. Chocolate flavor preferred. 10a & 2p ( chocolate or vanilla)   Yes [provider]  nystatin cream (MYCOSTATIN) Apply to affected area 2 times daily  Send to [place] on 08/05/21 Patient taking differently: Apply 1 Application topically 2 (two) times daily. 08/01/21  Yes Medina-Vargas, Monina C, NP  nystatin powder Apply 1 Application topically 3 (three) times daily as needed (redness/rash on the groin, abdomen, or under the breast).   Yes [provider]  ondansetron (ZOFRAN ODT) 8 MG disintegrating tablet Take 1 tablet (8 mg total) by mouth every 8 (eight) hours as needed for nausea. Send to [place] on 08/05/21 Patient taking differently: Take 8 mg by mouth every 8 (eight) hours as needed for nausea. 08/01/21  Yes Medina-Vargas, Monina C, NP  polyethylene glycol (MIRALAX / GLYCOLAX) 17 g packet Take 17 g by mouth daily as needed for mild constipation.   Yes [provider]  Polyvinyl Alcohol-Povidone (CLEAR EYES NATURAL TEARS) 5-6 MG/ML SOLN Place 1 drop into both eyes at bedtime.   Yes [provider]  senna (SENOKOT) 8.6 MG TABS tablet Take 1 tablet (8.6 mg total) by mouth daily as needed for mild constipation. 06/28/21  Yes Debbe Odea, MD  Zinc Oxide 12 % CREA Apply 1 Application topically in the morning, at noon, and at bedtime. To buttocks   Yes [provider]  cefUROXime (CEFTIN) 250 MG tablet Take 1 tablet (250 mg total) by  mouth 2 (two) times daily with a meal for 7 days. 12/16/21 12/23/21  Dorie Rank, MD  DULoxetine (CYMBALTA) 30 MG capsule Take 1 capsule (30 mg total) by mouth daily. 30 MG CAP TAKE 1 CAPSULE BY MOUTH ONCE DAILY (DO NOT CRUSH) -  send to [place] on 08/05/21 Patient not taking: Reported on 12/16/2021 08/01/21   Medina-Vargas, Monina C, NP      Allergies    Atorvastatin and Septra [sulfamethoxazole-trimethoprim]    Review of Systems   Review of  Systems  Physical Exam Updated Vital Signs BP (!) 133/99   Pulse 76   Temp 97.6 F (36.4 C)   Resp (!) 23   SpO2 100%  Physical Exam Vitals and nursing note reviewed.  Constitutional:      Appearance: She is well-developed. She is not diaphoretic.     Comments: Elderly, frail  HENT:     Head: Normocephalic and atraumatic.     Right Ear: External ear normal.     Left Ear: External ear normal.  Eyes:     General: No scleral icterus.       Right eye: No discharge.        Left eye: No discharge.     Conjunctiva/sclera: Conjunctivae normal.  Neck:     Trachea: No tracheal deviation.  Cardiovascular:     Rate and Rhythm: Normal rate and regular rhythm.  Pulmonary:     Effort: Pulmonary effort is normal. No respiratory distress.     Breath sounds: Normal breath sounds. No stridor. No wheezing or rales.  Abdominal:     General: Bowel sounds are normal. There is no distension.     Palpations: Abdomen is soft.     Tenderness: There is no abdominal tenderness. There is no guarding or rebound.  Musculoskeletal:        General: No tenderness or deformity.     Cervical back: Neck supple.  Skin:    General: Skin is warm and dry.     Findings: No rash.  Neurological:     Mental Status: She is alert. She is disoriented.     Cranial Nerves: No cranial nerve deficit (no facial droop, extraocular movements intact, no slurred speech).     Sensory: No sensory deficit.     Motor: No abnormal muscle tone or seizure activity.     Coordination: Coordination normal.     Comments: Patient is not aware the year or her location, does follow commands, able to lift both arms and legs off the bed although somewhat weaker on the right side  Psychiatric:        Mood and Affect: Mood normal.     ED Results / Procedures / Treatments   Labs (all labs ordered are listed, but only abnormal results are displayed) Labs Reviewed  CBC - Abnormal; Notable for the following components:      Result Value    WBC 13.1 (*)    RBC 3.63 (*)    MCV 101.4 (*)    All other components within normal limits  DIFFERENTIAL - Abnormal; Notable for the following components:   Neutro Abs 11.4 (*)    All other components within normal limits  URINALYSIS, ROUTINE W REFLEX MICROSCOPIC - Abnormal; Notable for the following components:   Color, Urine AMBER (*)    APPearance CLOUDY (*)    Hgb urine dipstick SMALL (*)    Protein, ur 100 (*)    Nitrite POSITIVE (*)  Leukocytes,Ua MODERATE (*)    WBC, UA >50 (*)    Bacteria, UA MANY (*)    All other components within normal limits  I-STAT CHEM 8, ED - Abnormal; Notable for the following components:   BUN 25 (*)    Glucose, Bld 133 (*)    TCO2 21 (*)    All other components within normal limits  URINE CULTURE  TROPONIN I (HIGH SENSITIVITY)  TROPONIN I (HIGH SENSITIVITY)    EKG EKG Interpretation  Date/Time:  Monday December 16 2021 09:10:53 EDT Ventricular Rate:  73 PR Interval:  145 QRS Duration: 106 QT Interval:  417 QTC Calculation: 460 R Axis:   55 Text Interpretation: Sinus rhythm Borderline repolarization abnormality No significant change since last tracing Confirmed by Dorie Rank [phone]) on 12/16/2021 9:15:16 AM  Radiology CT HEAD WO CONTRAST  Result Date: 12/16/2021 CLINICAL DATA:  Patient became unresponsive this morning. History of dementia. EXAM: CT HEAD WITHOUT CONTRAST TECHNIQUE: Contiguous axial images were obtained from the base of the skull through the vertex without intravenous contrast. RADIATION DOSE REDUCTION: This exam was performed according to the departmental dose-optimization program which includes automated exposure control, adjustment of the mA and/or kV according to patient size and/or use of iterative reconstruction technique. COMPARISON:  06/27/2021. FINDINGS: Brain: No evidence of acute infarction, hemorrhage, hydrocephalus, extra-axial collection or mass lesion/mass effect. Stable ventricular and sulcal enlargement  reflecting atrophy and stable patchy white matter hypoattenuation consistent with moderate chronic microvascular ischemic change. Vascular: No hyperdense vessel or unexpected calcification. Skull: Normal. Negative for fracture or focal lesion. Sinuses/Orbits: Visualized globes and orbits are unremarkable. Opacification of the small left frontal sinus, anterior left ethmoid air cells and most of the visualized left maxillary sinus, similar to the prior head CT. Other: None. IMPRESSION: 1. No acute intracranial abnormalities. Electronically Signed   By: Lajean Manes M.D.   On: 12/16/2021 10:04   DG Chest Portable 1 View  Result Date: 12/16/2021 CLINICAL DATA:  Syncope.  Altered mental status. EXAM: PORTABLE CHEST 1 VIEW COMPARISON:  06/27/2021 FINDINGS: The heart size and mediastinal contours are within normal limits. Both lungs are clear. Right shoulder prosthesis again noted. IMPRESSION: No active disease. Electronically Signed   By: Marlaine Hind M.D.   On: 12/16/2021 09:29    Procedures Procedures    Medications Ordered in ED Medications  sodium chloride 0.9 % bolus 500 mL (0 mLs Intravenous Stopped 12/16/21 1035)    Followed by  0.9 %  sodium chloride infusion (100 mL/hr Intravenous New Bag/Given 12/16/21 1035)  cefTRIAXone (ROCEPHIN) 1 g in sodium chloride 0.9 % 100 mL IVPB (0 g Intravenous Stopped 12/16/21 1223)    ED Course/ Medical Decision Making/ A&P Clinical Course as of 12/16/21 1457  Mon Dec 16, 2021  0957 Chest x-ray without acute findings, right prosthetic shoulder joint noted [JK]  1032 DG Chest Portable 1 View Chest x-ray without acute findings [JK]  1032 CT HEAD WO CONTRAST Head CT without acute findings [JK]  1229 Troponin I (High Sensitivity) [JK]  1229 Opponent normal [JK]    Clinical Course User Index [JK] Dorie Rank, MD                           Medical Decision Making Problems Addressed: Acute cystitis without hematuria: acute illness or injury Altered awareness,  transient: acute illness or injury  Amount and/or Complexity of Data Reviewed Independent Historian:     Details: Reviewed findings with  the patient's family members.  Patient does have history of recurrent UTIs.  She often complains of symptoms.  Urinalysis does suggest UTI.  Will treat for UTI.   Labs: ordered. Decision-making details documented in ED Course. Radiology: ordered. Decision-making details documented in ED Course.  Risk Prescription drug management. Risk Details: Presented with altered mental status.  Possible syncopal event.  TIA also consideration.  Patient currently after her mental status baseline.  No new signs of weakness or neurologic dysfunction.  We discussed possibilities of cardiac dysrhythmia or TIA causing the event earlier.  Patient has remained stable in the ED without any recurrent symptoms.  Case discussed with the patient's family members, her daughter and son-in-law Dr. Lucia Gaskins.  Discussed options of admission to the hospital for further work-up of possible syncopal work-up TIA.  At this point with the patient back to her baseline and no recurrent symptoms family is comfortable with discharge home back to the nursing facility on a course of antibiotics.         Final Clinical Impression(s) / ED Diagnoses Final diagnoses:  Altered awareness, transient  Acute cystitis without hematuria    Rx / DC Orders ED Discharge Orders          Ordered    cefUROXime (CEFTIN) 250 MG tablet  2 times daily with meals,   Status:  Discontinued        12/16/21 1453    cefUROXime (CEFTIN) 250 MG tablet  2 times daily with meals        12/16/21 1453              Dorie Rank, MD 12/16/21 1457

## 2021-12-17 DIAGNOSIS — R32 Unspecified urinary incontinence: Secondary | ICD-10-CM | POA: Diagnosis not present

## 2021-12-17 DIAGNOSIS — R296 Repeated falls: Secondary | ICD-10-CM | POA: Diagnosis not present

## 2021-12-17 DIAGNOSIS — I69318 Other symptoms and signs involving cognitive functions following cerebral infarction: Secondary | ICD-10-CM | POA: Diagnosis not present

## 2021-12-17 DIAGNOSIS — Z8616 Personal history of COVID-19: Secondary | ICD-10-CM | POA: Diagnosis not present

## 2021-12-17 DIAGNOSIS — I10 Essential (primary) hypertension: Secondary | ICD-10-CM | POA: Diagnosis not present

## 2021-12-17 DIAGNOSIS — F0390 Unspecified dementia without behavioral disturbance: Secondary | ICD-10-CM | POA: Diagnosis not present

## 2021-12-18 DIAGNOSIS — R296 Repeated falls: Secondary | ICD-10-CM | POA: Diagnosis not present

## 2021-12-18 DIAGNOSIS — Z8616 Personal history of COVID-19: Secondary | ICD-10-CM | POA: Diagnosis not present

## 2021-12-18 DIAGNOSIS — I69318 Other symptoms and signs involving cognitive functions following cerebral infarction: Secondary | ICD-10-CM | POA: Diagnosis not present

## 2021-12-18 DIAGNOSIS — F0390 Unspecified dementia without behavioral disturbance: Secondary | ICD-10-CM | POA: Diagnosis not present

## 2021-12-18 DIAGNOSIS — R32 Unspecified urinary incontinence: Secondary | ICD-10-CM | POA: Diagnosis not present

## 2021-12-18 DIAGNOSIS — I10 Essential (primary) hypertension: Secondary | ICD-10-CM | POA: Diagnosis not present

## 2021-12-18 LAB — URINE CULTURE: Culture: >=100000 — AB

## 2021-12-19 ENCOUNTER — Telehealth: Payer: Self-pay | Admitting: *Deleted

## 2021-12-19 NOTE — Telephone Encounter (Signed)
Post ED Visit - Positive Culture Follow-up  Culture report reviewed by antimicrobial stewardship pharmacist: [facility] Team '[]'$  Elenor Quinones, Pharm.D. '[]'$  Heide Guile, Pharm.D., BCPS AQ-ID '[]'$  Parks Neptune, Pharm.D., BCPS '[]'$  Alycia Rossetti, Pharm.D., BCPS '[]'$  Bly, [place].D., BCPS, AAHIVP '[]'$  Legrand Como, Pharm.D., BCPS, AAHIVP '[]'$  Salome Arnt, PharmD, BCPS '[]'$  Johnnette Gourd, PharmD, BCPS '[]'$  Hughes Better, PharmD, BCPS '[]'$  Leeroy Cha, PharmD '[]'$  Laqueta Linden, PharmD, BCPS '[]'$  Albertina Parr, PharmD  [facility] Team '[]'$  Leodis Sias, PharmD '[]'$  Lindell Spar, PharmD '[]'$  Royetta Asal, PharmD '[]'$  Graylin Shiver, Rph '[]'$  Rema Fendt) Glennon Mac, PharmD '[]'$  Arlyn Dunning, PharmD '[]'$  Netta Cedars, PharmD '[]'$  Dia Sitter, PharmD '[]'$  Leone Haven, PharmD '[]'$  Gretta Arab, PharmD '[]'$  Theodis Shove, PharmD '[]'$  Peggyann Juba, PharmD '[]'$  Reuel Boom, PharmD   Positive urine culture Treated with Cefuroxime Axetil, organism sensitive to the same and no further patient follow-up is required at this time. Reviewed by Pharmacy  Ardeen Fillers 12/19/2021, 9:59 AM

## 2021-12-20 DIAGNOSIS — R32 Unspecified urinary incontinence: Secondary | ICD-10-CM | POA: Diagnosis not present

## 2021-12-20 DIAGNOSIS — I69318 Other symptoms and signs involving cognitive functions following cerebral infarction: Secondary | ICD-10-CM | POA: Diagnosis not present

## 2021-12-20 DIAGNOSIS — Z8616 Personal history of COVID-19: Secondary | ICD-10-CM | POA: Diagnosis not present

## 2021-12-20 DIAGNOSIS — F0390 Unspecified dementia without behavioral disturbance: Secondary | ICD-10-CM | POA: Diagnosis not present

## 2021-12-20 DIAGNOSIS — R296 Repeated falls: Secondary | ICD-10-CM | POA: Diagnosis not present

## 2021-12-20 DIAGNOSIS — I10 Essential (primary) hypertension: Secondary | ICD-10-CM | POA: Diagnosis not present

## 2021-12-24 DIAGNOSIS — F0390 Unspecified dementia without behavioral disturbance: Secondary | ICD-10-CM | POA: Diagnosis not present

## 2021-12-24 DIAGNOSIS — Z8616 Personal history of COVID-19: Secondary | ICD-10-CM | POA: Diagnosis not present

## 2021-12-24 DIAGNOSIS — I69318 Other symptoms and signs involving cognitive functions following cerebral infarction: Secondary | ICD-10-CM | POA: Diagnosis not present

## 2021-12-24 DIAGNOSIS — R296 Repeated falls: Secondary | ICD-10-CM | POA: Diagnosis not present

## 2021-12-24 DIAGNOSIS — I10 Essential (primary) hypertension: Secondary | ICD-10-CM | POA: Diagnosis not present

## 2021-12-24 DIAGNOSIS — R32 Unspecified urinary incontinence: Secondary | ICD-10-CM | POA: Diagnosis not present

## 2021-12-25 DIAGNOSIS — I69318 Other symptoms and signs involving cognitive functions following cerebral infarction: Secondary | ICD-10-CM | POA: Diagnosis not present

## 2021-12-25 DIAGNOSIS — R32 Unspecified urinary incontinence: Secondary | ICD-10-CM | POA: Diagnosis not present

## 2021-12-25 DIAGNOSIS — R296 Repeated falls: Secondary | ICD-10-CM | POA: Diagnosis not present

## 2021-12-25 DIAGNOSIS — Z8616 Personal history of COVID-19: Secondary | ICD-10-CM | POA: Diagnosis not present

## 2021-12-25 DIAGNOSIS — I10 Essential (primary) hypertension: Secondary | ICD-10-CM | POA: Diagnosis not present

## 2021-12-25 DIAGNOSIS — F0390 Unspecified dementia without behavioral disturbance: Secondary | ICD-10-CM | POA: Diagnosis not present

## 2021-12-27 DIAGNOSIS — R296 Repeated falls: Secondary | ICD-10-CM | POA: Diagnosis not present

## 2021-12-27 DIAGNOSIS — I69318 Other symptoms and signs involving cognitive functions following cerebral infarction: Secondary | ICD-10-CM | POA: Diagnosis not present

## 2021-12-27 DIAGNOSIS — I10 Essential (primary) hypertension: Secondary | ICD-10-CM | POA: Diagnosis not present

## 2021-12-27 DIAGNOSIS — Z8616 Personal history of COVID-19: Secondary | ICD-10-CM | POA: Diagnosis not present

## 2021-12-27 DIAGNOSIS — R32 Unspecified urinary incontinence: Secondary | ICD-10-CM | POA: Diagnosis not present

## 2021-12-27 DIAGNOSIS — F0390 Unspecified dementia without behavioral disturbance: Secondary | ICD-10-CM | POA: Diagnosis not present

## 2021-12-31 DIAGNOSIS — R32 Unspecified urinary incontinence: Secondary | ICD-10-CM | POA: Diagnosis not present

## 2021-12-31 DIAGNOSIS — F0390 Unspecified dementia without behavioral disturbance: Secondary | ICD-10-CM | POA: Diagnosis not present

## 2021-12-31 DIAGNOSIS — I69318 Other symptoms and signs involving cognitive functions following cerebral infarction: Secondary | ICD-10-CM | POA: Diagnosis not present

## 2021-12-31 DIAGNOSIS — Z8616 Personal history of COVID-19: Secondary | ICD-10-CM | POA: Diagnosis not present

## 2021-12-31 DIAGNOSIS — I10 Essential (primary) hypertension: Secondary | ICD-10-CM | POA: Diagnosis not present

## 2021-12-31 DIAGNOSIS — R296 Repeated falls: Secondary | ICD-10-CM | POA: Diagnosis not present

## 2022-01-01 DIAGNOSIS — Z8616 Personal history of COVID-19: Secondary | ICD-10-CM | POA: Diagnosis not present

## 2022-01-01 DIAGNOSIS — R296 Repeated falls: Secondary | ICD-10-CM | POA: Diagnosis not present

## 2022-01-01 DIAGNOSIS — F0390 Unspecified dementia without behavioral disturbance: Secondary | ICD-10-CM | POA: Diagnosis not present

## 2022-01-01 DIAGNOSIS — I10 Essential (primary) hypertension: Secondary | ICD-10-CM | POA: Diagnosis not present

## 2022-01-01 DIAGNOSIS — I69318 Other symptoms and signs involving cognitive functions following cerebral infarction: Secondary | ICD-10-CM | POA: Diagnosis not present

## 2022-01-01 DIAGNOSIS — R32 Unspecified urinary incontinence: Secondary | ICD-10-CM | POA: Diagnosis not present

## 2022-01-03 DIAGNOSIS — I69318 Other symptoms and signs involving cognitive functions following cerebral infarction: Secondary | ICD-10-CM | POA: Diagnosis not present

## 2022-01-03 DIAGNOSIS — Z8616 Personal history of COVID-19: Secondary | ICD-10-CM | POA: Diagnosis not present

## 2022-01-03 DIAGNOSIS — F0390 Unspecified dementia without behavioral disturbance: Secondary | ICD-10-CM | POA: Diagnosis not present

## 2022-01-03 DIAGNOSIS — I10 Essential (primary) hypertension: Secondary | ICD-10-CM | POA: Diagnosis not present

## 2022-01-03 DIAGNOSIS — R32 Unspecified urinary incontinence: Secondary | ICD-10-CM | POA: Diagnosis not present

## 2022-01-03 DIAGNOSIS — R296 Repeated falls: Secondary | ICD-10-CM | POA: Diagnosis not present

## 2022-01-06 DIAGNOSIS — L84 Corns and callosities: Secondary | ICD-10-CM | POA: Diagnosis not present

## 2022-01-06 DIAGNOSIS — R32 Unspecified urinary incontinence: Secondary | ICD-10-CM | POA: Diagnosis not present

## 2022-01-06 DIAGNOSIS — I69318 Other symptoms and signs involving cognitive functions following cerebral infarction: Secondary | ICD-10-CM | POA: Diagnosis not present

## 2022-01-06 DIAGNOSIS — I10 Essential (primary) hypertension: Secondary | ICD-10-CM | POA: Diagnosis not present

## 2022-01-06 DIAGNOSIS — B351 Tinea unguium: Secondary | ICD-10-CM | POA: Diagnosis not present

## 2022-01-06 DIAGNOSIS — I739 Peripheral vascular disease, unspecified: Secondary | ICD-10-CM | POA: Diagnosis not present

## 2022-01-06 DIAGNOSIS — R296 Repeated falls: Secondary | ICD-10-CM | POA: Diagnosis not present

## 2022-01-06 DIAGNOSIS — Z8616 Personal history of COVID-19: Secondary | ICD-10-CM | POA: Diagnosis not present

## 2022-01-06 DIAGNOSIS — M2041 Other hammer toe(s) (acquired), right foot: Secondary | ICD-10-CM | POA: Diagnosis not present

## 2022-01-06 DIAGNOSIS — F0390 Unspecified dementia without behavioral disturbance: Secondary | ICD-10-CM | POA: Diagnosis not present

## 2022-01-08 DIAGNOSIS — R296 Repeated falls: Secondary | ICD-10-CM | POA: Diagnosis not present

## 2022-01-08 DIAGNOSIS — R32 Unspecified urinary incontinence: Secondary | ICD-10-CM | POA: Diagnosis not present

## 2022-01-08 DIAGNOSIS — I69318 Other symptoms and signs involving cognitive functions following cerebral infarction: Secondary | ICD-10-CM | POA: Diagnosis not present

## 2022-01-08 DIAGNOSIS — Z8616 Personal history of COVID-19: Secondary | ICD-10-CM | POA: Diagnosis not present

## 2022-01-08 DIAGNOSIS — F0390 Unspecified dementia without behavioral disturbance: Secondary | ICD-10-CM | POA: Diagnosis not present

## 2022-01-08 DIAGNOSIS — I10 Essential (primary) hypertension: Secondary | ICD-10-CM | POA: Diagnosis not present

## 2022-01-09 DIAGNOSIS — Z23 Encounter for immunization: Secondary | ICD-10-CM | POA: Diagnosis not present

## 2022-01-10 DIAGNOSIS — R296 Repeated falls: Secondary | ICD-10-CM | POA: Diagnosis not present

## 2022-01-10 DIAGNOSIS — Z8616 Personal history of COVID-19: Secondary | ICD-10-CM | POA: Diagnosis not present

## 2022-01-10 DIAGNOSIS — F0390 Unspecified dementia without behavioral disturbance: Secondary | ICD-10-CM | POA: Diagnosis not present

## 2022-01-10 DIAGNOSIS — I69318 Other symptoms and signs involving cognitive functions following cerebral infarction: Secondary | ICD-10-CM | POA: Diagnosis not present

## 2022-01-10 DIAGNOSIS — I10 Essential (primary) hypertension: Secondary | ICD-10-CM | POA: Diagnosis not present

## 2022-01-10 DIAGNOSIS — R32 Unspecified urinary incontinence: Secondary | ICD-10-CM | POA: Diagnosis not present

## 2022-01-12 DIAGNOSIS — Z741 Need for assistance with personal care: Secondary | ICD-10-CM | POA: Diagnosis not present

## 2022-01-12 DIAGNOSIS — I69318 Other symptoms and signs involving cognitive functions following cerebral infarction: Secondary | ICD-10-CM | POA: Diagnosis not present

## 2022-01-12 DIAGNOSIS — H3530 Unspecified macular degeneration: Secondary | ICD-10-CM | POA: Diagnosis not present

## 2022-01-12 DIAGNOSIS — I10 Essential (primary) hypertension: Secondary | ICD-10-CM | POA: Diagnosis not present

## 2022-01-12 DIAGNOSIS — H04129 Dry eye syndrome of unspecified lacrimal gland: Secondary | ICD-10-CM | POA: Diagnosis not present

## 2022-01-12 DIAGNOSIS — Z8616 Personal history of COVID-19: Secondary | ICD-10-CM | POA: Diagnosis not present

## 2022-01-12 DIAGNOSIS — R32 Unspecified urinary incontinence: Secondary | ICD-10-CM | POA: Diagnosis not present

## 2022-01-12 DIAGNOSIS — Z681 Body mass index (BMI) 19 or less, adult: Secondary | ICD-10-CM | POA: Diagnosis not present

## 2022-01-12 DIAGNOSIS — R296 Repeated falls: Secondary | ICD-10-CM | POA: Diagnosis not present

## 2022-01-12 DIAGNOSIS — L853 Xerosis cutis: Secondary | ICD-10-CM | POA: Diagnosis not present

## 2022-01-12 DIAGNOSIS — Z7401 Bed confinement status: Secondary | ICD-10-CM | POA: Diagnosis not present

## 2022-01-12 DIAGNOSIS — R159 Full incontinence of feces: Secondary | ICD-10-CM | POA: Diagnosis not present

## 2022-01-12 DIAGNOSIS — F0390 Unspecified dementia without behavioral disturbance: Secondary | ICD-10-CM | POA: Diagnosis not present

## 2022-01-13 DIAGNOSIS — I69318 Other symptoms and signs involving cognitive functions following cerebral infarction: Secondary | ICD-10-CM | POA: Diagnosis not present

## 2022-01-13 DIAGNOSIS — I10 Essential (primary) hypertension: Secondary | ICD-10-CM | POA: Diagnosis not present

## 2022-01-13 DIAGNOSIS — R32 Unspecified urinary incontinence: Secondary | ICD-10-CM | POA: Diagnosis not present

## 2022-01-13 DIAGNOSIS — Z8616 Personal history of COVID-19: Secondary | ICD-10-CM | POA: Diagnosis not present

## 2022-01-13 DIAGNOSIS — R296 Repeated falls: Secondary | ICD-10-CM | POA: Diagnosis not present

## 2022-01-13 DIAGNOSIS — F0390 Unspecified dementia without behavioral disturbance: Secondary | ICD-10-CM | POA: Diagnosis not present

## 2022-01-14 DIAGNOSIS — Z8616 Personal history of COVID-19: Secondary | ICD-10-CM | POA: Diagnosis not present

## 2022-01-14 DIAGNOSIS — R32 Unspecified urinary incontinence: Secondary | ICD-10-CM | POA: Diagnosis not present

## 2022-01-14 DIAGNOSIS — I69318 Other symptoms and signs involving cognitive functions following cerebral infarction: Secondary | ICD-10-CM | POA: Diagnosis not present

## 2022-01-14 DIAGNOSIS — I10 Essential (primary) hypertension: Secondary | ICD-10-CM | POA: Diagnosis not present

## 2022-01-14 DIAGNOSIS — R296 Repeated falls: Secondary | ICD-10-CM | POA: Diagnosis not present

## 2022-01-14 DIAGNOSIS — F0390 Unspecified dementia without behavioral disturbance: Secondary | ICD-10-CM | POA: Diagnosis not present

## 2022-01-15 DIAGNOSIS — Z8616 Personal history of COVID-19: Secondary | ICD-10-CM | POA: Diagnosis not present

## 2022-01-15 DIAGNOSIS — R32 Unspecified urinary incontinence: Secondary | ICD-10-CM | POA: Diagnosis not present

## 2022-01-15 DIAGNOSIS — F0390 Unspecified dementia without behavioral disturbance: Secondary | ICD-10-CM | POA: Diagnosis not present

## 2022-01-15 DIAGNOSIS — I10 Essential (primary) hypertension: Secondary | ICD-10-CM | POA: Diagnosis not present

## 2022-01-15 DIAGNOSIS — I69318 Other symptoms and signs involving cognitive functions following cerebral infarction: Secondary | ICD-10-CM | POA: Diagnosis not present

## 2022-01-15 DIAGNOSIS — R296 Repeated falls: Secondary | ICD-10-CM | POA: Diagnosis not present

## 2022-01-17 DIAGNOSIS — I10 Essential (primary) hypertension: Secondary | ICD-10-CM | POA: Diagnosis not present

## 2022-01-17 DIAGNOSIS — R32 Unspecified urinary incontinence: Secondary | ICD-10-CM | POA: Diagnosis not present

## 2022-01-17 DIAGNOSIS — I69318 Other symptoms and signs involving cognitive functions following cerebral infarction: Secondary | ICD-10-CM | POA: Diagnosis not present

## 2022-01-17 DIAGNOSIS — R296 Repeated falls: Secondary | ICD-10-CM | POA: Diagnosis not present

## 2022-01-17 DIAGNOSIS — F0390 Unspecified dementia without behavioral disturbance: Secondary | ICD-10-CM | POA: Diagnosis not present

## 2022-01-17 DIAGNOSIS — Z8616 Personal history of COVID-19: Secondary | ICD-10-CM | POA: Diagnosis not present

## 2022-01-22 DIAGNOSIS — Z8616 Personal history of COVID-19: Secondary | ICD-10-CM | POA: Diagnosis not present

## 2022-01-22 DIAGNOSIS — R296 Repeated falls: Secondary | ICD-10-CM | POA: Diagnosis not present

## 2022-01-22 DIAGNOSIS — F0390 Unspecified dementia without behavioral disturbance: Secondary | ICD-10-CM | POA: Diagnosis not present

## 2022-01-22 DIAGNOSIS — I10 Essential (primary) hypertension: Secondary | ICD-10-CM | POA: Diagnosis not present

## 2022-01-22 DIAGNOSIS — R32 Unspecified urinary incontinence: Secondary | ICD-10-CM | POA: Diagnosis not present

## 2022-01-22 DIAGNOSIS — I69318 Other symptoms and signs involving cognitive functions following cerebral infarction: Secondary | ICD-10-CM | POA: Diagnosis not present

## 2022-01-24 DIAGNOSIS — I10 Essential (primary) hypertension: Secondary | ICD-10-CM | POA: Diagnosis not present

## 2022-01-24 DIAGNOSIS — R32 Unspecified urinary incontinence: Secondary | ICD-10-CM | POA: Diagnosis not present

## 2022-01-24 DIAGNOSIS — F0390 Unspecified dementia without behavioral disturbance: Secondary | ICD-10-CM | POA: Diagnosis not present

## 2022-01-24 DIAGNOSIS — R296 Repeated falls: Secondary | ICD-10-CM | POA: Diagnosis not present

## 2022-01-24 DIAGNOSIS — I69318 Other symptoms and signs involving cognitive functions following cerebral infarction: Secondary | ICD-10-CM | POA: Diagnosis not present

## 2022-01-24 DIAGNOSIS — Z8616 Personal history of COVID-19: Secondary | ICD-10-CM | POA: Diagnosis not present

## 2022-01-27 DIAGNOSIS — F0390 Unspecified dementia without behavioral disturbance: Secondary | ICD-10-CM | POA: Diagnosis not present

## 2022-01-27 DIAGNOSIS — R32 Unspecified urinary incontinence: Secondary | ICD-10-CM | POA: Diagnosis not present

## 2022-01-27 DIAGNOSIS — Z8616 Personal history of COVID-19: Secondary | ICD-10-CM | POA: Diagnosis not present

## 2022-01-27 DIAGNOSIS — I69318 Other symptoms and signs involving cognitive functions following cerebral infarction: Secondary | ICD-10-CM | POA: Diagnosis not present

## 2022-01-27 DIAGNOSIS — I10 Essential (primary) hypertension: Secondary | ICD-10-CM | POA: Diagnosis not present

## 2022-01-27 DIAGNOSIS — R296 Repeated falls: Secondary | ICD-10-CM | POA: Diagnosis not present

## 2022-01-28 DIAGNOSIS — R32 Unspecified urinary incontinence: Secondary | ICD-10-CM | POA: Diagnosis not present

## 2022-01-28 DIAGNOSIS — Z8616 Personal history of COVID-19: Secondary | ICD-10-CM | POA: Diagnosis not present

## 2022-01-28 DIAGNOSIS — F0390 Unspecified dementia without behavioral disturbance: Secondary | ICD-10-CM | POA: Diagnosis not present

## 2022-01-28 DIAGNOSIS — R296 Repeated falls: Secondary | ICD-10-CM | POA: Diagnosis not present

## 2022-01-28 DIAGNOSIS — I10 Essential (primary) hypertension: Secondary | ICD-10-CM | POA: Diagnosis not present

## 2022-01-28 DIAGNOSIS — I69318 Other symptoms and signs involving cognitive functions following cerebral infarction: Secondary | ICD-10-CM | POA: Diagnosis not present

## 2022-01-29 DIAGNOSIS — I10 Essential (primary) hypertension: Secondary | ICD-10-CM | POA: Diagnosis not present

## 2022-01-29 DIAGNOSIS — F0390 Unspecified dementia without behavioral disturbance: Secondary | ICD-10-CM | POA: Diagnosis not present

## 2022-01-29 DIAGNOSIS — R296 Repeated falls: Secondary | ICD-10-CM | POA: Diagnosis not present

## 2022-01-29 DIAGNOSIS — Z8616 Personal history of COVID-19: Secondary | ICD-10-CM | POA: Diagnosis not present

## 2022-01-29 DIAGNOSIS — I69318 Other symptoms and signs involving cognitive functions following cerebral infarction: Secondary | ICD-10-CM | POA: Diagnosis not present

## 2022-01-29 DIAGNOSIS — R32 Unspecified urinary incontinence: Secondary | ICD-10-CM | POA: Diagnosis not present

## 2022-01-31 DIAGNOSIS — I10 Essential (primary) hypertension: Secondary | ICD-10-CM | POA: Diagnosis not present

## 2022-01-31 DIAGNOSIS — Z8616 Personal history of COVID-19: Secondary | ICD-10-CM | POA: Diagnosis not present

## 2022-01-31 DIAGNOSIS — F0390 Unspecified dementia without behavioral disturbance: Secondary | ICD-10-CM | POA: Diagnosis not present

## 2022-01-31 DIAGNOSIS — I69318 Other symptoms and signs involving cognitive functions following cerebral infarction: Secondary | ICD-10-CM | POA: Diagnosis not present

## 2022-01-31 DIAGNOSIS — R296 Repeated falls: Secondary | ICD-10-CM | POA: Diagnosis not present

## 2022-01-31 DIAGNOSIS — R32 Unspecified urinary incontinence: Secondary | ICD-10-CM | POA: Diagnosis not present

## 2022-02-04 DIAGNOSIS — R32 Unspecified urinary incontinence: Secondary | ICD-10-CM | POA: Diagnosis not present

## 2022-02-04 DIAGNOSIS — I10 Essential (primary) hypertension: Secondary | ICD-10-CM | POA: Diagnosis not present

## 2022-02-04 DIAGNOSIS — R296 Repeated falls: Secondary | ICD-10-CM | POA: Diagnosis not present

## 2022-02-04 DIAGNOSIS — F0390 Unspecified dementia without behavioral disturbance: Secondary | ICD-10-CM | POA: Diagnosis not present

## 2022-02-04 DIAGNOSIS — I69318 Other symptoms and signs involving cognitive functions following cerebral infarction: Secondary | ICD-10-CM | POA: Diagnosis not present

## 2022-02-04 DIAGNOSIS — Z8616 Personal history of COVID-19: Secondary | ICD-10-CM | POA: Diagnosis not present

## 2022-02-05 DIAGNOSIS — F0390 Unspecified dementia without behavioral disturbance: Secondary | ICD-10-CM | POA: Diagnosis not present

## 2022-02-05 DIAGNOSIS — Z8616 Personal history of COVID-19: Secondary | ICD-10-CM | POA: Diagnosis not present

## 2022-02-05 DIAGNOSIS — R32 Unspecified urinary incontinence: Secondary | ICD-10-CM | POA: Diagnosis not present

## 2022-02-05 DIAGNOSIS — I69318 Other symptoms and signs involving cognitive functions following cerebral infarction: Secondary | ICD-10-CM | POA: Diagnosis not present

## 2022-02-05 DIAGNOSIS — I10 Essential (primary) hypertension: Secondary | ICD-10-CM | POA: Diagnosis not present

## 2022-02-05 DIAGNOSIS — R296 Repeated falls: Secondary | ICD-10-CM | POA: Diagnosis not present

## 2022-02-08 DIAGNOSIS — R296 Repeated falls: Secondary | ICD-10-CM | POA: Diagnosis not present

## 2022-02-08 DIAGNOSIS — F0390 Unspecified dementia without behavioral disturbance: Secondary | ICD-10-CM | POA: Diagnosis not present

## 2022-02-08 DIAGNOSIS — I69318 Other symptoms and signs involving cognitive functions following cerebral infarction: Secondary | ICD-10-CM | POA: Diagnosis not present

## 2022-02-08 DIAGNOSIS — R32 Unspecified urinary incontinence: Secondary | ICD-10-CM | POA: Diagnosis not present

## 2022-02-08 DIAGNOSIS — I10 Essential (primary) hypertension: Secondary | ICD-10-CM | POA: Diagnosis not present

## 2022-02-08 DIAGNOSIS — Z8616 Personal history of COVID-19: Secondary | ICD-10-CM | POA: Diagnosis not present

## 2022-02-11 DIAGNOSIS — F0390 Unspecified dementia without behavioral disturbance: Secondary | ICD-10-CM | POA: Diagnosis not present

## 2022-02-11 DIAGNOSIS — R32 Unspecified urinary incontinence: Secondary | ICD-10-CM | POA: Diagnosis not present

## 2022-02-11 DIAGNOSIS — I69318 Other symptoms and signs involving cognitive functions following cerebral infarction: Secondary | ICD-10-CM | POA: Diagnosis not present

## 2022-02-11 DIAGNOSIS — R296 Repeated falls: Secondary | ICD-10-CM | POA: Diagnosis not present

## 2022-02-11 DIAGNOSIS — I10 Essential (primary) hypertension: Secondary | ICD-10-CM | POA: Diagnosis not present

## 2022-02-11 DIAGNOSIS — Z8616 Personal history of COVID-19: Secondary | ICD-10-CM | POA: Diagnosis not present

## 2022-02-12 DIAGNOSIS — L853 Xerosis cutis: Secondary | ICD-10-CM | POA: Diagnosis not present

## 2022-02-12 DIAGNOSIS — R296 Repeated falls: Secondary | ICD-10-CM | POA: Diagnosis not present

## 2022-02-12 DIAGNOSIS — Z681 Body mass index (BMI) 19 or less, adult: Secondary | ICD-10-CM | POA: Diagnosis not present

## 2022-02-12 DIAGNOSIS — I10 Essential (primary) hypertension: Secondary | ICD-10-CM | POA: Diagnosis not present

## 2022-02-12 DIAGNOSIS — R32 Unspecified urinary incontinence: Secondary | ICD-10-CM | POA: Diagnosis not present

## 2022-02-12 DIAGNOSIS — Z741 Need for assistance with personal care: Secondary | ICD-10-CM | POA: Diagnosis not present

## 2022-02-12 DIAGNOSIS — Z8616 Personal history of COVID-19: Secondary | ICD-10-CM | POA: Diagnosis not present

## 2022-02-12 DIAGNOSIS — H04129 Dry eye syndrome of unspecified lacrimal gland: Secondary | ICD-10-CM | POA: Diagnosis not present

## 2022-02-12 DIAGNOSIS — F0390 Unspecified dementia without behavioral disturbance: Secondary | ICD-10-CM | POA: Diagnosis not present

## 2022-02-12 DIAGNOSIS — H3530 Unspecified macular degeneration: Secondary | ICD-10-CM | POA: Diagnosis not present

## 2022-02-12 DIAGNOSIS — R159 Full incontinence of feces: Secondary | ICD-10-CM | POA: Diagnosis not present

## 2022-02-12 DIAGNOSIS — Z7401 Bed confinement status: Secondary | ICD-10-CM | POA: Diagnosis not present

## 2022-02-12 DIAGNOSIS — I69318 Other symptoms and signs involving cognitive functions following cerebral infarction: Secondary | ICD-10-CM | POA: Diagnosis not present

## 2022-02-13 DIAGNOSIS — Z8616 Personal history of COVID-19: Secondary | ICD-10-CM | POA: Diagnosis not present

## 2022-02-13 DIAGNOSIS — F0390 Unspecified dementia without behavioral disturbance: Secondary | ICD-10-CM | POA: Diagnosis not present

## 2022-02-13 DIAGNOSIS — I10 Essential (primary) hypertension: Secondary | ICD-10-CM | POA: Diagnosis not present

## 2022-02-13 DIAGNOSIS — I69318 Other symptoms and signs involving cognitive functions following cerebral infarction: Secondary | ICD-10-CM | POA: Diagnosis not present

## 2022-02-13 DIAGNOSIS — R296 Repeated falls: Secondary | ICD-10-CM | POA: Diagnosis not present

## 2022-02-13 DIAGNOSIS — R32 Unspecified urinary incontinence: Secondary | ICD-10-CM | POA: Diagnosis not present

## 2022-02-14 DIAGNOSIS — R296 Repeated falls: Secondary | ICD-10-CM | POA: Diagnosis not present

## 2022-02-14 DIAGNOSIS — I69318 Other symptoms and signs involving cognitive functions following cerebral infarction: Secondary | ICD-10-CM | POA: Diagnosis not present

## 2022-02-14 DIAGNOSIS — I10 Essential (primary) hypertension: Secondary | ICD-10-CM | POA: Diagnosis not present

## 2022-02-14 DIAGNOSIS — R32 Unspecified urinary incontinence: Secondary | ICD-10-CM | POA: Diagnosis not present

## 2022-02-14 DIAGNOSIS — F0390 Unspecified dementia without behavioral disturbance: Secondary | ICD-10-CM | POA: Diagnosis not present

## 2022-02-14 DIAGNOSIS — Z8616 Personal history of COVID-19: Secondary | ICD-10-CM | POA: Diagnosis not present

## 2022-02-17 DIAGNOSIS — I69318 Other symptoms and signs involving cognitive functions following cerebral infarction: Secondary | ICD-10-CM | POA: Diagnosis not present

## 2022-02-17 DIAGNOSIS — I10 Essential (primary) hypertension: Secondary | ICD-10-CM | POA: Diagnosis not present

## 2022-02-17 DIAGNOSIS — R296 Repeated falls: Secondary | ICD-10-CM | POA: Diagnosis not present

## 2022-02-17 DIAGNOSIS — F0390 Unspecified dementia without behavioral disturbance: Secondary | ICD-10-CM | POA: Diagnosis not present

## 2022-02-17 DIAGNOSIS — Z8616 Personal history of COVID-19: Secondary | ICD-10-CM | POA: Diagnosis not present

## 2022-02-17 DIAGNOSIS — R32 Unspecified urinary incontinence: Secondary | ICD-10-CM | POA: Diagnosis not present

## 2022-02-19 DIAGNOSIS — R296 Repeated falls: Secondary | ICD-10-CM | POA: Diagnosis not present

## 2022-02-19 DIAGNOSIS — Z8616 Personal history of COVID-19: Secondary | ICD-10-CM | POA: Diagnosis not present

## 2022-02-19 DIAGNOSIS — R32 Unspecified urinary incontinence: Secondary | ICD-10-CM | POA: Diagnosis not present

## 2022-02-19 DIAGNOSIS — F0390 Unspecified dementia without behavioral disturbance: Secondary | ICD-10-CM | POA: Diagnosis not present

## 2022-02-19 DIAGNOSIS — I10 Essential (primary) hypertension: Secondary | ICD-10-CM | POA: Diagnosis not present

## 2022-02-19 DIAGNOSIS — I69318 Other symptoms and signs involving cognitive functions following cerebral infarction: Secondary | ICD-10-CM | POA: Diagnosis not present

## 2022-02-21 DIAGNOSIS — I69318 Other symptoms and signs involving cognitive functions following cerebral infarction: Secondary | ICD-10-CM | POA: Diagnosis not present

## 2022-02-21 DIAGNOSIS — I10 Essential (primary) hypertension: Secondary | ICD-10-CM | POA: Diagnosis not present

## 2022-02-21 DIAGNOSIS — Z8616 Personal history of COVID-19: Secondary | ICD-10-CM | POA: Diagnosis not present

## 2022-02-21 DIAGNOSIS — F0390 Unspecified dementia without behavioral disturbance: Secondary | ICD-10-CM | POA: Diagnosis not present

## 2022-02-21 DIAGNOSIS — R296 Repeated falls: Secondary | ICD-10-CM | POA: Diagnosis not present

## 2022-02-21 DIAGNOSIS — R32 Unspecified urinary incontinence: Secondary | ICD-10-CM | POA: Diagnosis not present

## 2022-02-24 DIAGNOSIS — R296 Repeated falls: Secondary | ICD-10-CM | POA: Diagnosis not present

## 2022-02-24 DIAGNOSIS — R32 Unspecified urinary incontinence: Secondary | ICD-10-CM | POA: Diagnosis not present

## 2022-02-24 DIAGNOSIS — Z8616 Personal history of COVID-19: Secondary | ICD-10-CM | POA: Diagnosis not present

## 2022-02-24 DIAGNOSIS — I10 Essential (primary) hypertension: Secondary | ICD-10-CM | POA: Diagnosis not present

## 2022-02-24 DIAGNOSIS — F0390 Unspecified dementia without behavioral disturbance: Secondary | ICD-10-CM | POA: Diagnosis not present

## 2022-02-24 DIAGNOSIS — I69318 Other symptoms and signs involving cognitive functions following cerebral infarction: Secondary | ICD-10-CM | POA: Diagnosis not present

## 2022-02-26 DIAGNOSIS — R32 Unspecified urinary incontinence: Secondary | ICD-10-CM | POA: Diagnosis not present

## 2022-02-26 DIAGNOSIS — I10 Essential (primary) hypertension: Secondary | ICD-10-CM | POA: Diagnosis not present

## 2022-02-26 DIAGNOSIS — I69318 Other symptoms and signs involving cognitive functions following cerebral infarction: Secondary | ICD-10-CM | POA: Diagnosis not present

## 2022-02-26 DIAGNOSIS — F0390 Unspecified dementia without behavioral disturbance: Secondary | ICD-10-CM | POA: Diagnosis not present

## 2022-02-26 DIAGNOSIS — Z8616 Personal history of COVID-19: Secondary | ICD-10-CM | POA: Diagnosis not present

## 2022-02-26 DIAGNOSIS — R296 Repeated falls: Secondary | ICD-10-CM | POA: Diagnosis not present

## 2022-02-28 DIAGNOSIS — I69318 Other symptoms and signs involving cognitive functions following cerebral infarction: Secondary | ICD-10-CM | POA: Diagnosis not present

## 2022-02-28 DIAGNOSIS — F0390 Unspecified dementia without behavioral disturbance: Secondary | ICD-10-CM | POA: Diagnosis not present

## 2022-02-28 DIAGNOSIS — Z8616 Personal history of COVID-19: Secondary | ICD-10-CM | POA: Diagnosis not present

## 2022-02-28 DIAGNOSIS — R32 Unspecified urinary incontinence: Secondary | ICD-10-CM | POA: Diagnosis not present

## 2022-02-28 DIAGNOSIS — I10 Essential (primary) hypertension: Secondary | ICD-10-CM | POA: Diagnosis not present

## 2022-02-28 DIAGNOSIS — R296 Repeated falls: Secondary | ICD-10-CM | POA: Diagnosis not present

## 2022-03-03 DIAGNOSIS — I69318 Other symptoms and signs involving cognitive functions following cerebral infarction: Secondary | ICD-10-CM | POA: Diagnosis not present

## 2022-03-03 DIAGNOSIS — Z8616 Personal history of COVID-19: Secondary | ICD-10-CM | POA: Diagnosis not present

## 2022-03-03 DIAGNOSIS — I10 Essential (primary) hypertension: Secondary | ICD-10-CM | POA: Diagnosis not present

## 2022-03-03 DIAGNOSIS — F0390 Unspecified dementia without behavioral disturbance: Secondary | ICD-10-CM | POA: Diagnosis not present

## 2022-03-03 DIAGNOSIS — R32 Unspecified urinary incontinence: Secondary | ICD-10-CM | POA: Diagnosis not present

## 2022-03-03 DIAGNOSIS — R296 Repeated falls: Secondary | ICD-10-CM | POA: Diagnosis not present

## 2022-03-05 DIAGNOSIS — R296 Repeated falls: Secondary | ICD-10-CM | POA: Diagnosis not present

## 2022-03-05 DIAGNOSIS — F0390 Unspecified dementia without behavioral disturbance: Secondary | ICD-10-CM | POA: Diagnosis not present

## 2022-03-05 DIAGNOSIS — Z8616 Personal history of COVID-19: Secondary | ICD-10-CM | POA: Diagnosis not present

## 2022-03-05 DIAGNOSIS — I10 Essential (primary) hypertension: Secondary | ICD-10-CM | POA: Diagnosis not present

## 2022-03-05 DIAGNOSIS — I69318 Other symptoms and signs involving cognitive functions following cerebral infarction: Secondary | ICD-10-CM | POA: Diagnosis not present

## 2022-03-05 DIAGNOSIS — R32 Unspecified urinary incontinence: Secondary | ICD-10-CM | POA: Diagnosis not present

## 2022-03-07 DIAGNOSIS — F0390 Unspecified dementia without behavioral disturbance: Secondary | ICD-10-CM | POA: Diagnosis not present

## 2022-03-07 DIAGNOSIS — Z8616 Personal history of COVID-19: Secondary | ICD-10-CM | POA: Diagnosis not present

## 2022-03-07 DIAGNOSIS — R296 Repeated falls: Secondary | ICD-10-CM | POA: Diagnosis not present

## 2022-03-07 DIAGNOSIS — R32 Unspecified urinary incontinence: Secondary | ICD-10-CM | POA: Diagnosis not present

## 2022-03-07 DIAGNOSIS — I10 Essential (primary) hypertension: Secondary | ICD-10-CM | POA: Diagnosis not present

## 2022-03-07 DIAGNOSIS — I69318 Other symptoms and signs involving cognitive functions following cerebral infarction: Secondary | ICD-10-CM | POA: Diagnosis not present

## 2022-03-10 DIAGNOSIS — R296 Repeated falls: Secondary | ICD-10-CM | POA: Diagnosis not present

## 2022-03-10 DIAGNOSIS — Z8616 Personal history of COVID-19: Secondary | ICD-10-CM | POA: Diagnosis not present

## 2022-03-10 DIAGNOSIS — F0390 Unspecified dementia without behavioral disturbance: Secondary | ICD-10-CM | POA: Diagnosis not present

## 2022-03-10 DIAGNOSIS — R32 Unspecified urinary incontinence: Secondary | ICD-10-CM | POA: Diagnosis not present

## 2022-03-10 DIAGNOSIS — I69318 Other symptoms and signs involving cognitive functions following cerebral infarction: Secondary | ICD-10-CM | POA: Diagnosis not present

## 2022-03-10 DIAGNOSIS — I10 Essential (primary) hypertension: Secondary | ICD-10-CM | POA: Diagnosis not present

## 2022-03-12 DIAGNOSIS — R296 Repeated falls: Secondary | ICD-10-CM | POA: Diagnosis not present

## 2022-03-12 DIAGNOSIS — Z8616 Personal history of COVID-19: Secondary | ICD-10-CM | POA: Diagnosis not present

## 2022-03-12 DIAGNOSIS — I69318 Other symptoms and signs involving cognitive functions following cerebral infarction: Secondary | ICD-10-CM | POA: Diagnosis not present

## 2022-03-12 DIAGNOSIS — F0390 Unspecified dementia without behavioral disturbance: Secondary | ICD-10-CM | POA: Diagnosis not present

## 2022-03-12 DIAGNOSIS — I10 Essential (primary) hypertension: Secondary | ICD-10-CM | POA: Diagnosis not present

## 2022-03-12 DIAGNOSIS — R32 Unspecified urinary incontinence: Secondary | ICD-10-CM | POA: Diagnosis not present

## 2022-03-13 DIAGNOSIS — F0390 Unspecified dementia without behavioral disturbance: Secondary | ICD-10-CM | POA: Diagnosis not present

## 2022-03-13 DIAGNOSIS — I10 Essential (primary) hypertension: Secondary | ICD-10-CM | POA: Diagnosis not present

## 2022-03-13 DIAGNOSIS — R32 Unspecified urinary incontinence: Secondary | ICD-10-CM | POA: Diagnosis not present

## 2022-03-13 DIAGNOSIS — R296 Repeated falls: Secondary | ICD-10-CM | POA: Diagnosis not present

## 2022-03-13 DIAGNOSIS — I69318 Other symptoms and signs involving cognitive functions following cerebral infarction: Secondary | ICD-10-CM | POA: Diagnosis not present

## 2022-03-13 DIAGNOSIS — Z8616 Personal history of COVID-19: Secondary | ICD-10-CM | POA: Diagnosis not present

## 2022-03-14 DIAGNOSIS — R296 Repeated falls: Secondary | ICD-10-CM | POA: Diagnosis not present

## 2022-03-14 DIAGNOSIS — H04129 Dry eye syndrome of unspecified lacrimal gland: Secondary | ICD-10-CM | POA: Diagnosis not present

## 2022-03-14 DIAGNOSIS — R32 Unspecified urinary incontinence: Secondary | ICD-10-CM | POA: Diagnosis not present

## 2022-03-14 DIAGNOSIS — I10 Essential (primary) hypertension: Secondary | ICD-10-CM | POA: Diagnosis not present

## 2022-03-14 DIAGNOSIS — H3530 Unspecified macular degeneration: Secondary | ICD-10-CM | POA: Diagnosis not present

## 2022-03-14 DIAGNOSIS — R159 Full incontinence of feces: Secondary | ICD-10-CM | POA: Diagnosis not present

## 2022-03-14 DIAGNOSIS — Z7401 Bed confinement status: Secondary | ICD-10-CM | POA: Diagnosis not present

## 2022-03-14 DIAGNOSIS — Z681 Body mass index (BMI) 19 or less, adult: Secondary | ICD-10-CM | POA: Diagnosis not present

## 2022-03-14 DIAGNOSIS — F0390 Unspecified dementia without behavioral disturbance: Secondary | ICD-10-CM | POA: Diagnosis not present

## 2022-03-14 DIAGNOSIS — L853 Xerosis cutis: Secondary | ICD-10-CM | POA: Diagnosis not present

## 2022-03-14 DIAGNOSIS — Z741 Need for assistance with personal care: Secondary | ICD-10-CM | POA: Diagnosis not present

## 2022-03-14 DIAGNOSIS — I69318 Other symptoms and signs involving cognitive functions following cerebral infarction: Secondary | ICD-10-CM | POA: Diagnosis not present

## 2022-03-14 DIAGNOSIS — Z8616 Personal history of COVID-19: Secondary | ICD-10-CM | POA: Diagnosis not present

## 2022-03-17 DIAGNOSIS — Z8616 Personal history of COVID-19: Secondary | ICD-10-CM | POA: Diagnosis not present

## 2022-03-17 DIAGNOSIS — I10 Essential (primary) hypertension: Secondary | ICD-10-CM | POA: Diagnosis not present

## 2022-03-17 DIAGNOSIS — R32 Unspecified urinary incontinence: Secondary | ICD-10-CM | POA: Diagnosis not present

## 2022-03-17 DIAGNOSIS — I69318 Other symptoms and signs involving cognitive functions following cerebral infarction: Secondary | ICD-10-CM | POA: Diagnosis not present

## 2022-03-17 DIAGNOSIS — F0390 Unspecified dementia without behavioral disturbance: Secondary | ICD-10-CM | POA: Diagnosis not present

## 2022-03-17 DIAGNOSIS — R296 Repeated falls: Secondary | ICD-10-CM | POA: Diagnosis not present

## 2022-03-19 DIAGNOSIS — F0390 Unspecified dementia without behavioral disturbance: Secondary | ICD-10-CM | POA: Diagnosis not present

## 2022-03-19 DIAGNOSIS — L84 Corns and callosities: Secondary | ICD-10-CM | POA: Diagnosis not present

## 2022-03-19 DIAGNOSIS — I10 Essential (primary) hypertension: Secondary | ICD-10-CM | POA: Diagnosis not present

## 2022-03-19 DIAGNOSIS — R296 Repeated falls: Secondary | ICD-10-CM | POA: Diagnosis not present

## 2022-03-19 DIAGNOSIS — R32 Unspecified urinary incontinence: Secondary | ICD-10-CM | POA: Diagnosis not present

## 2022-03-19 DIAGNOSIS — Z8616 Personal history of COVID-19: Secondary | ICD-10-CM | POA: Diagnosis not present

## 2022-03-19 DIAGNOSIS — I69318 Other symptoms and signs involving cognitive functions following cerebral infarction: Secondary | ICD-10-CM | POA: Diagnosis not present

## 2022-03-19 DIAGNOSIS — I739 Peripheral vascular disease, unspecified: Secondary | ICD-10-CM | POA: Diagnosis not present

## 2022-03-19 DIAGNOSIS — M2041 Other hammer toe(s) (acquired), right foot: Secondary | ICD-10-CM | POA: Diagnosis not present

## 2022-03-19 DIAGNOSIS — B351 Tinea unguium: Secondary | ICD-10-CM | POA: Diagnosis not present

## 2022-03-20 DIAGNOSIS — I69318 Other symptoms and signs involving cognitive functions following cerebral infarction: Secondary | ICD-10-CM | POA: Diagnosis not present

## 2022-03-20 DIAGNOSIS — I10 Essential (primary) hypertension: Secondary | ICD-10-CM | POA: Diagnosis not present

## 2022-03-20 DIAGNOSIS — F0390 Unspecified dementia without behavioral disturbance: Secondary | ICD-10-CM | POA: Diagnosis not present

## 2022-03-20 DIAGNOSIS — Z8616 Personal history of COVID-19: Secondary | ICD-10-CM | POA: Diagnosis not present

## 2022-03-20 DIAGNOSIS — R32 Unspecified urinary incontinence: Secondary | ICD-10-CM | POA: Diagnosis not present

## 2022-03-20 DIAGNOSIS — R296 Repeated falls: Secondary | ICD-10-CM | POA: Diagnosis not present

## 2022-03-21 DIAGNOSIS — R32 Unspecified urinary incontinence: Secondary | ICD-10-CM | POA: Diagnosis not present

## 2022-03-21 DIAGNOSIS — I10 Essential (primary) hypertension: Secondary | ICD-10-CM | POA: Diagnosis not present

## 2022-03-21 DIAGNOSIS — R296 Repeated falls: Secondary | ICD-10-CM | POA: Diagnosis not present

## 2022-03-21 DIAGNOSIS — F0390 Unspecified dementia without behavioral disturbance: Secondary | ICD-10-CM | POA: Diagnosis not present

## 2022-03-21 DIAGNOSIS — Z8616 Personal history of COVID-19: Secondary | ICD-10-CM | POA: Diagnosis not present

## 2022-03-21 DIAGNOSIS — I69318 Other symptoms and signs involving cognitive functions following cerebral infarction: Secondary | ICD-10-CM | POA: Diagnosis not present

## 2022-03-26 DIAGNOSIS — Z8616 Personal history of COVID-19: Secondary | ICD-10-CM | POA: Diagnosis not present

## 2022-03-26 DIAGNOSIS — R296 Repeated falls: Secondary | ICD-10-CM | POA: Diagnosis not present

## 2022-03-26 DIAGNOSIS — I69318 Other symptoms and signs involving cognitive functions following cerebral infarction: Secondary | ICD-10-CM | POA: Diagnosis not present

## 2022-03-26 DIAGNOSIS — R32 Unspecified urinary incontinence: Secondary | ICD-10-CM | POA: Diagnosis not present

## 2022-03-26 DIAGNOSIS — I10 Essential (primary) hypertension: Secondary | ICD-10-CM | POA: Diagnosis not present

## 2022-03-26 DIAGNOSIS — F0390 Unspecified dementia without behavioral disturbance: Secondary | ICD-10-CM | POA: Diagnosis not present

## 2022-03-28 DIAGNOSIS — I10 Essential (primary) hypertension: Secondary | ICD-10-CM | POA: Diagnosis not present

## 2022-03-28 DIAGNOSIS — R32 Unspecified urinary incontinence: Secondary | ICD-10-CM | POA: Diagnosis not present

## 2022-03-28 DIAGNOSIS — Z8616 Personal history of COVID-19: Secondary | ICD-10-CM | POA: Diagnosis not present

## 2022-03-28 DIAGNOSIS — I69318 Other symptoms and signs involving cognitive functions following cerebral infarction: Secondary | ICD-10-CM | POA: Diagnosis not present

## 2022-03-28 DIAGNOSIS — R296 Repeated falls: Secondary | ICD-10-CM | POA: Diagnosis not present

## 2022-03-28 DIAGNOSIS — F0390 Unspecified dementia without behavioral disturbance: Secondary | ICD-10-CM | POA: Diagnosis not present

## 2022-03-31 DIAGNOSIS — F0390 Unspecified dementia without behavioral disturbance: Secondary | ICD-10-CM | POA: Diagnosis not present

## 2022-03-31 DIAGNOSIS — I69318 Other symptoms and signs involving cognitive functions following cerebral infarction: Secondary | ICD-10-CM | POA: Diagnosis not present

## 2022-03-31 DIAGNOSIS — I10 Essential (primary) hypertension: Secondary | ICD-10-CM | POA: Diagnosis not present

## 2022-03-31 DIAGNOSIS — R296 Repeated falls: Secondary | ICD-10-CM | POA: Diagnosis not present

## 2022-03-31 DIAGNOSIS — Z8616 Personal history of COVID-19: Secondary | ICD-10-CM | POA: Diagnosis not present

## 2022-03-31 DIAGNOSIS — R32 Unspecified urinary incontinence: Secondary | ICD-10-CM | POA: Diagnosis not present

## 2022-04-02 DIAGNOSIS — R296 Repeated falls: Secondary | ICD-10-CM | POA: Diagnosis not present

## 2022-04-02 DIAGNOSIS — I10 Essential (primary) hypertension: Secondary | ICD-10-CM | POA: Diagnosis not present

## 2022-04-02 DIAGNOSIS — I69318 Other symptoms and signs involving cognitive functions following cerebral infarction: Secondary | ICD-10-CM | POA: Diagnosis not present

## 2022-04-02 DIAGNOSIS — R32 Unspecified urinary incontinence: Secondary | ICD-10-CM | POA: Diagnosis not present

## 2022-04-02 DIAGNOSIS — Z8616 Personal history of COVID-19: Secondary | ICD-10-CM | POA: Diagnosis not present

## 2022-04-02 DIAGNOSIS — F0390 Unspecified dementia without behavioral disturbance: Secondary | ICD-10-CM | POA: Diagnosis not present

## 2022-04-04 DIAGNOSIS — F0390 Unspecified dementia without behavioral disturbance: Secondary | ICD-10-CM | POA: Diagnosis not present

## 2022-04-04 DIAGNOSIS — R296 Repeated falls: Secondary | ICD-10-CM | POA: Diagnosis not present

## 2022-04-04 DIAGNOSIS — Z8616 Personal history of COVID-19: Secondary | ICD-10-CM | POA: Diagnosis not present

## 2022-04-04 DIAGNOSIS — I69318 Other symptoms and signs involving cognitive functions following cerebral infarction: Secondary | ICD-10-CM | POA: Diagnosis not present

## 2022-04-04 DIAGNOSIS — R32 Unspecified urinary incontinence: Secondary | ICD-10-CM | POA: Diagnosis not present

## 2022-04-04 DIAGNOSIS — I10 Essential (primary) hypertension: Secondary | ICD-10-CM | POA: Diagnosis not present

## 2022-04-09 DIAGNOSIS — F0390 Unspecified dementia without behavioral disturbance: Secondary | ICD-10-CM | POA: Diagnosis not present

## 2022-04-09 DIAGNOSIS — Z8616 Personal history of COVID-19: Secondary | ICD-10-CM | POA: Diagnosis not present

## 2022-04-09 DIAGNOSIS — I10 Essential (primary) hypertension: Secondary | ICD-10-CM | POA: Diagnosis not present

## 2022-04-09 DIAGNOSIS — R32 Unspecified urinary incontinence: Secondary | ICD-10-CM | POA: Diagnosis not present

## 2022-04-09 DIAGNOSIS — I69318 Other symptoms and signs involving cognitive functions following cerebral infarction: Secondary | ICD-10-CM | POA: Diagnosis not present

## 2022-04-09 DIAGNOSIS — R296 Repeated falls: Secondary | ICD-10-CM | POA: Diagnosis not present

## 2022-04-11 DIAGNOSIS — F0390 Unspecified dementia without behavioral disturbance: Secondary | ICD-10-CM | POA: Diagnosis not present

## 2022-04-11 DIAGNOSIS — I10 Essential (primary) hypertension: Secondary | ICD-10-CM | POA: Diagnosis not present

## 2022-04-11 DIAGNOSIS — R32 Unspecified urinary incontinence: Secondary | ICD-10-CM | POA: Diagnosis not present

## 2022-04-11 DIAGNOSIS — R296 Repeated falls: Secondary | ICD-10-CM | POA: Diagnosis not present

## 2022-04-11 DIAGNOSIS — Z8616 Personal history of COVID-19: Secondary | ICD-10-CM | POA: Diagnosis not present

## 2022-04-11 DIAGNOSIS — I69318 Other symptoms and signs involving cognitive functions following cerebral infarction: Secondary | ICD-10-CM | POA: Diagnosis not present

## 2022-04-14 DIAGNOSIS — F0390 Unspecified dementia without behavioral disturbance: Secondary | ICD-10-CM | POA: Diagnosis not present

## 2022-04-14 DIAGNOSIS — I69318 Other symptoms and signs involving cognitive functions following cerebral infarction: Secondary | ICD-10-CM | POA: Diagnosis not present

## 2022-04-14 DIAGNOSIS — R159 Full incontinence of feces: Secondary | ICD-10-CM | POA: Diagnosis not present

## 2022-04-14 DIAGNOSIS — R32 Unspecified urinary incontinence: Secondary | ICD-10-CM | POA: Diagnosis not present

## 2022-04-14 DIAGNOSIS — R296 Repeated falls: Secondary | ICD-10-CM | POA: Diagnosis not present

## 2022-04-14 DIAGNOSIS — L853 Xerosis cutis: Secondary | ICD-10-CM | POA: Diagnosis not present

## 2022-04-14 DIAGNOSIS — H3530 Unspecified macular degeneration: Secondary | ICD-10-CM | POA: Diagnosis not present

## 2022-04-14 DIAGNOSIS — H04129 Dry eye syndrome of unspecified lacrimal gland: Secondary | ICD-10-CM | POA: Diagnosis not present

## 2022-04-14 DIAGNOSIS — Z7401 Bed confinement status: Secondary | ICD-10-CM | POA: Diagnosis not present

## 2022-04-14 DIAGNOSIS — Z741 Need for assistance with personal care: Secondary | ICD-10-CM | POA: Diagnosis not present

## 2022-04-14 DIAGNOSIS — I10 Essential (primary) hypertension: Secondary | ICD-10-CM | POA: Diagnosis not present

## 2022-04-14 DIAGNOSIS — Z8616 Personal history of COVID-19: Secondary | ICD-10-CM | POA: Diagnosis not present

## 2022-04-14 DIAGNOSIS — Z681 Body mass index (BMI) 19 or less, adult: Secondary | ICD-10-CM | POA: Diagnosis not present

## 2022-04-16 DIAGNOSIS — F0390 Unspecified dementia without behavioral disturbance: Secondary | ICD-10-CM | POA: Diagnosis not present

## 2022-04-16 DIAGNOSIS — R32 Unspecified urinary incontinence: Secondary | ICD-10-CM | POA: Diagnosis not present

## 2022-04-16 DIAGNOSIS — I10 Essential (primary) hypertension: Secondary | ICD-10-CM | POA: Diagnosis not present

## 2022-04-16 DIAGNOSIS — I69318 Other symptoms and signs involving cognitive functions following cerebral infarction: Secondary | ICD-10-CM | POA: Diagnosis not present

## 2022-04-16 DIAGNOSIS — R296 Repeated falls: Secondary | ICD-10-CM | POA: Diagnosis not present

## 2022-04-16 DIAGNOSIS — Z8616 Personal history of COVID-19: Secondary | ICD-10-CM | POA: Diagnosis not present

## 2022-04-18 DIAGNOSIS — I10 Essential (primary) hypertension: Secondary | ICD-10-CM | POA: Diagnosis not present

## 2022-04-18 DIAGNOSIS — Z8616 Personal history of COVID-19: Secondary | ICD-10-CM | POA: Diagnosis not present

## 2022-04-18 DIAGNOSIS — R296 Repeated falls: Secondary | ICD-10-CM | POA: Diagnosis not present

## 2022-04-18 DIAGNOSIS — F0390 Unspecified dementia without behavioral disturbance: Secondary | ICD-10-CM | POA: Diagnosis not present

## 2022-04-18 DIAGNOSIS — I69318 Other symptoms and signs involving cognitive functions following cerebral infarction: Secondary | ICD-10-CM | POA: Diagnosis not present

## 2022-04-18 DIAGNOSIS — R32 Unspecified urinary incontinence: Secondary | ICD-10-CM | POA: Diagnosis not present

## 2022-04-21 DIAGNOSIS — I10 Essential (primary) hypertension: Secondary | ICD-10-CM | POA: Diagnosis not present

## 2022-04-21 DIAGNOSIS — Z8616 Personal history of COVID-19: Secondary | ICD-10-CM | POA: Diagnosis not present

## 2022-04-21 DIAGNOSIS — R296 Repeated falls: Secondary | ICD-10-CM | POA: Diagnosis not present

## 2022-04-21 DIAGNOSIS — F0390 Unspecified dementia without behavioral disturbance: Secondary | ICD-10-CM | POA: Diagnosis not present

## 2022-04-21 DIAGNOSIS — I69318 Other symptoms and signs involving cognitive functions following cerebral infarction: Secondary | ICD-10-CM | POA: Diagnosis not present

## 2022-04-21 DIAGNOSIS — R32 Unspecified urinary incontinence: Secondary | ICD-10-CM | POA: Diagnosis not present

## 2022-04-23 DIAGNOSIS — I10 Essential (primary) hypertension: Secondary | ICD-10-CM | POA: Diagnosis not present

## 2022-04-23 DIAGNOSIS — Z8616 Personal history of COVID-19: Secondary | ICD-10-CM | POA: Diagnosis not present

## 2022-04-23 DIAGNOSIS — R32 Unspecified urinary incontinence: Secondary | ICD-10-CM | POA: Diagnosis not present

## 2022-04-23 DIAGNOSIS — F0390 Unspecified dementia without behavioral disturbance: Secondary | ICD-10-CM | POA: Diagnosis not present

## 2022-04-23 DIAGNOSIS — R296 Repeated falls: Secondary | ICD-10-CM | POA: Diagnosis not present

## 2022-04-23 DIAGNOSIS — I69318 Other symptoms and signs involving cognitive functions following cerebral infarction: Secondary | ICD-10-CM | POA: Diagnosis not present

## 2022-04-25 DIAGNOSIS — R296 Repeated falls: Secondary | ICD-10-CM | POA: Diagnosis not present

## 2022-04-25 DIAGNOSIS — I69318 Other symptoms and signs involving cognitive functions following cerebral infarction: Secondary | ICD-10-CM | POA: Diagnosis not present

## 2022-04-25 DIAGNOSIS — I10 Essential (primary) hypertension: Secondary | ICD-10-CM | POA: Diagnosis not present

## 2022-04-25 DIAGNOSIS — Z8616 Personal history of COVID-19: Secondary | ICD-10-CM | POA: Diagnosis not present

## 2022-04-25 DIAGNOSIS — R32 Unspecified urinary incontinence: Secondary | ICD-10-CM | POA: Diagnosis not present

## 2022-04-25 DIAGNOSIS — F0390 Unspecified dementia without behavioral disturbance: Secondary | ICD-10-CM | POA: Diagnosis not present

## 2022-04-28 DIAGNOSIS — Z8616 Personal history of COVID-19: Secondary | ICD-10-CM | POA: Diagnosis not present

## 2022-04-28 DIAGNOSIS — I69318 Other symptoms and signs involving cognitive functions following cerebral infarction: Secondary | ICD-10-CM | POA: Diagnosis not present

## 2022-04-28 DIAGNOSIS — R32 Unspecified urinary incontinence: Secondary | ICD-10-CM | POA: Diagnosis not present

## 2022-04-28 DIAGNOSIS — F0390 Unspecified dementia without behavioral disturbance: Secondary | ICD-10-CM | POA: Diagnosis not present

## 2022-04-28 DIAGNOSIS — R296 Repeated falls: Secondary | ICD-10-CM | POA: Diagnosis not present

## 2022-04-28 DIAGNOSIS — I10 Essential (primary) hypertension: Secondary | ICD-10-CM | POA: Diagnosis not present

## 2022-04-30 DIAGNOSIS — F0390 Unspecified dementia without behavioral disturbance: Secondary | ICD-10-CM | POA: Diagnosis not present

## 2022-04-30 DIAGNOSIS — Z8616 Personal history of COVID-19: Secondary | ICD-10-CM | POA: Diagnosis not present

## 2022-04-30 DIAGNOSIS — I69318 Other symptoms and signs involving cognitive functions following cerebral infarction: Secondary | ICD-10-CM | POA: Diagnosis not present

## 2022-04-30 DIAGNOSIS — R32 Unspecified urinary incontinence: Secondary | ICD-10-CM | POA: Diagnosis not present

## 2022-04-30 DIAGNOSIS — I10 Essential (primary) hypertension: Secondary | ICD-10-CM | POA: Diagnosis not present

## 2022-04-30 DIAGNOSIS — R296 Repeated falls: Secondary | ICD-10-CM | POA: Diagnosis not present

## 2022-05-01 DIAGNOSIS — R32 Unspecified urinary incontinence: Secondary | ICD-10-CM | POA: Diagnosis not present

## 2022-05-01 DIAGNOSIS — F0390 Unspecified dementia without behavioral disturbance: Secondary | ICD-10-CM | POA: Diagnosis not present

## 2022-05-01 DIAGNOSIS — R296 Repeated falls: Secondary | ICD-10-CM | POA: Diagnosis not present

## 2022-05-01 DIAGNOSIS — I10 Essential (primary) hypertension: Secondary | ICD-10-CM | POA: Diagnosis not present

## 2022-05-01 DIAGNOSIS — Z8616 Personal history of COVID-19: Secondary | ICD-10-CM | POA: Diagnosis not present

## 2022-05-01 DIAGNOSIS — I69318 Other symptoms and signs involving cognitive functions following cerebral infarction: Secondary | ICD-10-CM | POA: Diagnosis not present

## 2022-05-02 DIAGNOSIS — R32 Unspecified urinary incontinence: Secondary | ICD-10-CM | POA: Diagnosis not present

## 2022-05-02 DIAGNOSIS — I10 Essential (primary) hypertension: Secondary | ICD-10-CM | POA: Diagnosis not present

## 2022-05-02 DIAGNOSIS — I69318 Other symptoms and signs involving cognitive functions following cerebral infarction: Secondary | ICD-10-CM | POA: Diagnosis not present

## 2022-05-02 DIAGNOSIS — F0390 Unspecified dementia without behavioral disturbance: Secondary | ICD-10-CM | POA: Diagnosis not present

## 2022-05-02 DIAGNOSIS — Z8616 Personal history of COVID-19: Secondary | ICD-10-CM | POA: Diagnosis not present

## 2022-05-02 DIAGNOSIS — R296 Repeated falls: Secondary | ICD-10-CM | POA: Diagnosis not present

## 2022-05-05 DIAGNOSIS — R32 Unspecified urinary incontinence: Secondary | ICD-10-CM | POA: Diagnosis not present

## 2022-05-05 DIAGNOSIS — R296 Repeated falls: Secondary | ICD-10-CM | POA: Diagnosis not present

## 2022-05-05 DIAGNOSIS — I69318 Other symptoms and signs involving cognitive functions following cerebral infarction: Secondary | ICD-10-CM | POA: Diagnosis not present

## 2022-05-05 DIAGNOSIS — I10 Essential (primary) hypertension: Secondary | ICD-10-CM | POA: Diagnosis not present

## 2022-05-05 DIAGNOSIS — F0390 Unspecified dementia without behavioral disturbance: Secondary | ICD-10-CM | POA: Diagnosis not present

## 2022-05-05 DIAGNOSIS — Z8616 Personal history of COVID-19: Secondary | ICD-10-CM | POA: Diagnosis not present

## 2022-05-06 DIAGNOSIS — I10 Essential (primary) hypertension: Secondary | ICD-10-CM | POA: Diagnosis not present

## 2022-05-06 DIAGNOSIS — Z8616 Personal history of COVID-19: Secondary | ICD-10-CM | POA: Diagnosis not present

## 2022-05-06 DIAGNOSIS — R296 Repeated falls: Secondary | ICD-10-CM | POA: Diagnosis not present

## 2022-05-06 DIAGNOSIS — I69318 Other symptoms and signs involving cognitive functions following cerebral infarction: Secondary | ICD-10-CM | POA: Diagnosis not present

## 2022-05-06 DIAGNOSIS — F0390 Unspecified dementia without behavioral disturbance: Secondary | ICD-10-CM | POA: Diagnosis not present

## 2022-05-06 DIAGNOSIS — R32 Unspecified urinary incontinence: Secondary | ICD-10-CM | POA: Diagnosis not present

## 2022-05-09 DIAGNOSIS — F0390 Unspecified dementia without behavioral disturbance: Secondary | ICD-10-CM | POA: Diagnosis not present

## 2022-05-09 DIAGNOSIS — I10 Essential (primary) hypertension: Secondary | ICD-10-CM | POA: Diagnosis not present

## 2022-05-09 DIAGNOSIS — R32 Unspecified urinary incontinence: Secondary | ICD-10-CM | POA: Diagnosis not present

## 2022-05-09 DIAGNOSIS — I69318 Other symptoms and signs involving cognitive functions following cerebral infarction: Secondary | ICD-10-CM | POA: Diagnosis not present

## 2022-05-09 DIAGNOSIS — R296 Repeated falls: Secondary | ICD-10-CM | POA: Diagnosis not present

## 2022-05-09 DIAGNOSIS — Z8616 Personal history of COVID-19: Secondary | ICD-10-CM | POA: Diagnosis not present

## 2022-05-12 DIAGNOSIS — Z8616 Personal history of COVID-19: Secondary | ICD-10-CM | POA: Diagnosis not present

## 2022-05-12 DIAGNOSIS — R32 Unspecified urinary incontinence: Secondary | ICD-10-CM | POA: Diagnosis not present

## 2022-05-12 DIAGNOSIS — R296 Repeated falls: Secondary | ICD-10-CM | POA: Diagnosis not present

## 2022-05-12 DIAGNOSIS — I10 Essential (primary) hypertension: Secondary | ICD-10-CM | POA: Diagnosis not present

## 2022-05-12 DIAGNOSIS — F0390 Unspecified dementia without behavioral disturbance: Secondary | ICD-10-CM | POA: Diagnosis not present

## 2022-05-12 DIAGNOSIS — I69318 Other symptoms and signs involving cognitive functions following cerebral infarction: Secondary | ICD-10-CM | POA: Diagnosis not present

## 2022-05-13 DIAGNOSIS — Z8616 Personal history of COVID-19: Secondary | ICD-10-CM | POA: Diagnosis not present

## 2022-05-13 DIAGNOSIS — I10 Essential (primary) hypertension: Secondary | ICD-10-CM | POA: Diagnosis not present

## 2022-05-13 DIAGNOSIS — R296 Repeated falls: Secondary | ICD-10-CM | POA: Diagnosis not present

## 2022-05-13 DIAGNOSIS — F0390 Unspecified dementia without behavioral disturbance: Secondary | ICD-10-CM | POA: Diagnosis not present

## 2022-05-13 DIAGNOSIS — R32 Unspecified urinary incontinence: Secondary | ICD-10-CM | POA: Diagnosis not present

## 2022-05-13 DIAGNOSIS — I69318 Other symptoms and signs involving cognitive functions following cerebral infarction: Secondary | ICD-10-CM | POA: Diagnosis not present

## 2022-05-15 DIAGNOSIS — R296 Repeated falls: Secondary | ICD-10-CM | POA: Diagnosis not present

## 2022-05-15 DIAGNOSIS — L853 Xerosis cutis: Secondary | ICD-10-CM | POA: Diagnosis not present

## 2022-05-15 DIAGNOSIS — R159 Full incontinence of feces: Secondary | ICD-10-CM | POA: Diagnosis not present

## 2022-05-15 DIAGNOSIS — F0390 Unspecified dementia without behavioral disturbance: Secondary | ICD-10-CM | POA: Diagnosis not present

## 2022-05-15 DIAGNOSIS — I69318 Other symptoms and signs involving cognitive functions following cerebral infarction: Secondary | ICD-10-CM | POA: Diagnosis not present

## 2022-05-15 DIAGNOSIS — H3530 Unspecified macular degeneration: Secondary | ICD-10-CM | POA: Diagnosis not present

## 2022-05-15 DIAGNOSIS — I10 Essential (primary) hypertension: Secondary | ICD-10-CM | POA: Diagnosis not present

## 2022-05-15 DIAGNOSIS — H04129 Dry eye syndrome of unspecified lacrimal gland: Secondary | ICD-10-CM | POA: Diagnosis not present

## 2022-05-15 DIAGNOSIS — Z8616 Personal history of COVID-19: Secondary | ICD-10-CM | POA: Diagnosis not present

## 2022-05-15 DIAGNOSIS — Z741 Need for assistance with personal care: Secondary | ICD-10-CM | POA: Diagnosis not present

## 2022-05-15 DIAGNOSIS — R32 Unspecified urinary incontinence: Secondary | ICD-10-CM | POA: Diagnosis not present

## 2022-05-15 DIAGNOSIS — Z7401 Bed confinement status: Secondary | ICD-10-CM | POA: Diagnosis not present

## 2022-05-15 DIAGNOSIS — Z681 Body mass index (BMI) 19 or less, adult: Secondary | ICD-10-CM | POA: Diagnosis not present

## 2022-05-16 DIAGNOSIS — R296 Repeated falls: Secondary | ICD-10-CM | POA: Diagnosis not present

## 2022-05-16 DIAGNOSIS — I10 Essential (primary) hypertension: Secondary | ICD-10-CM | POA: Diagnosis not present

## 2022-05-16 DIAGNOSIS — Z8616 Personal history of COVID-19: Secondary | ICD-10-CM | POA: Diagnosis not present

## 2022-05-16 DIAGNOSIS — I69318 Other symptoms and signs involving cognitive functions following cerebral infarction: Secondary | ICD-10-CM | POA: Diagnosis not present

## 2022-05-16 DIAGNOSIS — R32 Unspecified urinary incontinence: Secondary | ICD-10-CM | POA: Diagnosis not present

## 2022-05-16 DIAGNOSIS — F0390 Unspecified dementia without behavioral disturbance: Secondary | ICD-10-CM | POA: Diagnosis not present

## 2022-05-19 DIAGNOSIS — F0390 Unspecified dementia without behavioral disturbance: Secondary | ICD-10-CM | POA: Diagnosis not present

## 2022-05-19 DIAGNOSIS — R296 Repeated falls: Secondary | ICD-10-CM | POA: Diagnosis not present

## 2022-05-19 DIAGNOSIS — I10 Essential (primary) hypertension: Secondary | ICD-10-CM | POA: Diagnosis not present

## 2022-05-19 DIAGNOSIS — R32 Unspecified urinary incontinence: Secondary | ICD-10-CM | POA: Diagnosis not present

## 2022-05-19 DIAGNOSIS — I69318 Other symptoms and signs involving cognitive functions following cerebral infarction: Secondary | ICD-10-CM | POA: Diagnosis not present

## 2022-05-19 DIAGNOSIS — Z8616 Personal history of COVID-19: Secondary | ICD-10-CM | POA: Diagnosis not present

## 2022-05-20 DIAGNOSIS — R296 Repeated falls: Secondary | ICD-10-CM | POA: Diagnosis not present

## 2022-05-20 DIAGNOSIS — I10 Essential (primary) hypertension: Secondary | ICD-10-CM | POA: Diagnosis not present

## 2022-05-20 DIAGNOSIS — Z8616 Personal history of COVID-19: Secondary | ICD-10-CM | POA: Diagnosis not present

## 2022-05-20 DIAGNOSIS — R32 Unspecified urinary incontinence: Secondary | ICD-10-CM | POA: Diagnosis not present

## 2022-05-20 DIAGNOSIS — I69318 Other symptoms and signs involving cognitive functions following cerebral infarction: Secondary | ICD-10-CM | POA: Diagnosis not present

## 2022-05-20 DIAGNOSIS — F0390 Unspecified dementia without behavioral disturbance: Secondary | ICD-10-CM | POA: Diagnosis not present

## 2022-05-23 DIAGNOSIS — R32 Unspecified urinary incontinence: Secondary | ICD-10-CM | POA: Diagnosis not present

## 2022-05-23 DIAGNOSIS — R296 Repeated falls: Secondary | ICD-10-CM | POA: Diagnosis not present

## 2022-05-23 DIAGNOSIS — Z8616 Personal history of COVID-19: Secondary | ICD-10-CM | POA: Diagnosis not present

## 2022-05-23 DIAGNOSIS — I69318 Other symptoms and signs involving cognitive functions following cerebral infarction: Secondary | ICD-10-CM | POA: Diagnosis not present

## 2022-05-23 DIAGNOSIS — I10 Essential (primary) hypertension: Secondary | ICD-10-CM | POA: Diagnosis not present

## 2022-05-23 DIAGNOSIS — F0390 Unspecified dementia without behavioral disturbance: Secondary | ICD-10-CM | POA: Diagnosis not present

## 2022-05-26 DIAGNOSIS — R296 Repeated falls: Secondary | ICD-10-CM | POA: Diagnosis not present

## 2022-05-26 DIAGNOSIS — R32 Unspecified urinary incontinence: Secondary | ICD-10-CM | POA: Diagnosis not present

## 2022-05-26 DIAGNOSIS — F0390 Unspecified dementia without behavioral disturbance: Secondary | ICD-10-CM | POA: Diagnosis not present

## 2022-05-26 DIAGNOSIS — I69318 Other symptoms and signs involving cognitive functions following cerebral infarction: Secondary | ICD-10-CM | POA: Diagnosis not present

## 2022-05-26 DIAGNOSIS — Z8616 Personal history of COVID-19: Secondary | ICD-10-CM | POA: Diagnosis not present

## 2022-05-26 DIAGNOSIS — I10 Essential (primary) hypertension: Secondary | ICD-10-CM | POA: Diagnosis not present

## 2022-05-27 DIAGNOSIS — F0390 Unspecified dementia without behavioral disturbance: Secondary | ICD-10-CM | POA: Diagnosis not present

## 2022-05-27 DIAGNOSIS — I69318 Other symptoms and signs involving cognitive functions following cerebral infarction: Secondary | ICD-10-CM | POA: Diagnosis not present

## 2022-05-27 DIAGNOSIS — R296 Repeated falls: Secondary | ICD-10-CM | POA: Diagnosis not present

## 2022-05-27 DIAGNOSIS — Z8616 Personal history of COVID-19: Secondary | ICD-10-CM | POA: Diagnosis not present

## 2022-05-27 DIAGNOSIS — R32 Unspecified urinary incontinence: Secondary | ICD-10-CM | POA: Diagnosis not present

## 2022-05-27 DIAGNOSIS — I10 Essential (primary) hypertension: Secondary | ICD-10-CM | POA: Diagnosis not present

## 2022-05-30 DIAGNOSIS — R296 Repeated falls: Secondary | ICD-10-CM | POA: Diagnosis not present

## 2022-05-30 DIAGNOSIS — F0390 Unspecified dementia without behavioral disturbance: Secondary | ICD-10-CM | POA: Diagnosis not present

## 2022-05-30 DIAGNOSIS — Z8616 Personal history of COVID-19: Secondary | ICD-10-CM | POA: Diagnosis not present

## 2022-05-30 DIAGNOSIS — I69318 Other symptoms and signs involving cognitive functions following cerebral infarction: Secondary | ICD-10-CM | POA: Diagnosis not present

## 2022-05-30 DIAGNOSIS — R32 Unspecified urinary incontinence: Secondary | ICD-10-CM | POA: Diagnosis not present

## 2022-05-30 DIAGNOSIS — I10 Essential (primary) hypertension: Secondary | ICD-10-CM | POA: Diagnosis not present

## 2022-06-02 DIAGNOSIS — Z8616 Personal history of COVID-19: Secondary | ICD-10-CM | POA: Diagnosis not present

## 2022-06-02 DIAGNOSIS — R296 Repeated falls: Secondary | ICD-10-CM | POA: Diagnosis not present

## 2022-06-02 DIAGNOSIS — I10 Essential (primary) hypertension: Secondary | ICD-10-CM | POA: Diagnosis not present

## 2022-06-02 DIAGNOSIS — F0390 Unspecified dementia without behavioral disturbance: Secondary | ICD-10-CM | POA: Diagnosis not present

## 2022-06-02 DIAGNOSIS — R32 Unspecified urinary incontinence: Secondary | ICD-10-CM | POA: Diagnosis not present

## 2022-06-02 DIAGNOSIS — I69318 Other symptoms and signs involving cognitive functions following cerebral infarction: Secondary | ICD-10-CM | POA: Diagnosis not present

## 2022-06-03 DIAGNOSIS — R32 Unspecified urinary incontinence: Secondary | ICD-10-CM | POA: Diagnosis not present

## 2022-06-03 DIAGNOSIS — R296 Repeated falls: Secondary | ICD-10-CM | POA: Diagnosis not present

## 2022-06-03 DIAGNOSIS — Z8616 Personal history of COVID-19: Secondary | ICD-10-CM | POA: Diagnosis not present

## 2022-06-03 DIAGNOSIS — F0390 Unspecified dementia without behavioral disturbance: Secondary | ICD-10-CM | POA: Diagnosis not present

## 2022-06-03 DIAGNOSIS — I10 Essential (primary) hypertension: Secondary | ICD-10-CM | POA: Diagnosis not present

## 2022-06-03 DIAGNOSIS — I69318 Other symptoms and signs involving cognitive functions following cerebral infarction: Secondary | ICD-10-CM | POA: Diagnosis not present

## 2022-06-06 DIAGNOSIS — Z8616 Personal history of COVID-19: Secondary | ICD-10-CM | POA: Diagnosis not present

## 2022-06-06 DIAGNOSIS — R296 Repeated falls: Secondary | ICD-10-CM | POA: Diagnosis not present

## 2022-06-06 DIAGNOSIS — I69318 Other symptoms and signs involving cognitive functions following cerebral infarction: Secondary | ICD-10-CM | POA: Diagnosis not present

## 2022-06-06 DIAGNOSIS — F0390 Unspecified dementia without behavioral disturbance: Secondary | ICD-10-CM | POA: Diagnosis not present

## 2022-06-06 DIAGNOSIS — I10 Essential (primary) hypertension: Secondary | ICD-10-CM | POA: Diagnosis not present

## 2022-06-06 DIAGNOSIS — R32 Unspecified urinary incontinence: Secondary | ICD-10-CM | POA: Diagnosis not present

## 2022-06-09 DIAGNOSIS — R296 Repeated falls: Secondary | ICD-10-CM | POA: Diagnosis not present

## 2022-06-09 DIAGNOSIS — I10 Essential (primary) hypertension: Secondary | ICD-10-CM | POA: Diagnosis not present

## 2022-06-09 DIAGNOSIS — Z8616 Personal history of COVID-19: Secondary | ICD-10-CM | POA: Diagnosis not present

## 2022-06-09 DIAGNOSIS — I69318 Other symptoms and signs involving cognitive functions following cerebral infarction: Secondary | ICD-10-CM | POA: Diagnosis not present

## 2022-06-09 DIAGNOSIS — F0390 Unspecified dementia without behavioral disturbance: Secondary | ICD-10-CM | POA: Diagnosis not present

## 2022-06-09 DIAGNOSIS — R32 Unspecified urinary incontinence: Secondary | ICD-10-CM | POA: Diagnosis not present

## 2022-06-10 DIAGNOSIS — R32 Unspecified urinary incontinence: Secondary | ICD-10-CM | POA: Diagnosis not present

## 2022-06-10 DIAGNOSIS — F0390 Unspecified dementia without behavioral disturbance: Secondary | ICD-10-CM | POA: Diagnosis not present

## 2022-06-10 DIAGNOSIS — Z8616 Personal history of COVID-19: Secondary | ICD-10-CM | POA: Diagnosis not present

## 2022-06-10 DIAGNOSIS — I10 Essential (primary) hypertension: Secondary | ICD-10-CM | POA: Diagnosis not present

## 2022-06-10 DIAGNOSIS — R296 Repeated falls: Secondary | ICD-10-CM | POA: Diagnosis not present

## 2022-06-10 DIAGNOSIS — I69318 Other symptoms and signs involving cognitive functions following cerebral infarction: Secondary | ICD-10-CM | POA: Diagnosis not present

## 2022-06-13 DIAGNOSIS — R159 Full incontinence of feces: Secondary | ICD-10-CM | POA: Diagnosis not present

## 2022-06-13 DIAGNOSIS — F0390 Unspecified dementia without behavioral disturbance: Secondary | ICD-10-CM | POA: Diagnosis not present

## 2022-06-13 DIAGNOSIS — H04129 Dry eye syndrome of unspecified lacrimal gland: Secondary | ICD-10-CM | POA: Diagnosis not present

## 2022-06-13 DIAGNOSIS — Z741 Need for assistance with personal care: Secondary | ICD-10-CM | POA: Diagnosis not present

## 2022-06-13 DIAGNOSIS — I10 Essential (primary) hypertension: Secondary | ICD-10-CM | POA: Diagnosis not present

## 2022-06-13 DIAGNOSIS — Z7401 Bed confinement status: Secondary | ICD-10-CM | POA: Diagnosis not present

## 2022-06-13 DIAGNOSIS — I69318 Other symptoms and signs involving cognitive functions following cerebral infarction: Secondary | ICD-10-CM | POA: Diagnosis not present

## 2022-06-13 DIAGNOSIS — H3530 Unspecified macular degeneration: Secondary | ICD-10-CM | POA: Diagnosis not present

## 2022-06-13 DIAGNOSIS — R32 Unspecified urinary incontinence: Secondary | ICD-10-CM | POA: Diagnosis not present

## 2022-06-13 DIAGNOSIS — L853 Xerosis cutis: Secondary | ICD-10-CM | POA: Diagnosis not present

## 2022-06-13 DIAGNOSIS — Z8616 Personal history of COVID-19: Secondary | ICD-10-CM | POA: Diagnosis not present

## 2022-06-13 DIAGNOSIS — R296 Repeated falls: Secondary | ICD-10-CM | POA: Diagnosis not present

## 2022-06-13 DIAGNOSIS — Z681 Body mass index (BMI) 19 or less, adult: Secondary | ICD-10-CM | POA: Diagnosis not present

## 2022-06-16 DIAGNOSIS — I69318 Other symptoms and signs involving cognitive functions following cerebral infarction: Secondary | ICD-10-CM | POA: Diagnosis not present

## 2022-06-16 DIAGNOSIS — Z8616 Personal history of COVID-19: Secondary | ICD-10-CM | POA: Diagnosis not present

## 2022-06-16 DIAGNOSIS — R296 Repeated falls: Secondary | ICD-10-CM | POA: Diagnosis not present

## 2022-06-16 DIAGNOSIS — F0390 Unspecified dementia without behavioral disturbance: Secondary | ICD-10-CM | POA: Diagnosis not present

## 2022-06-16 DIAGNOSIS — R32 Unspecified urinary incontinence: Secondary | ICD-10-CM | POA: Diagnosis not present

## 2022-06-16 DIAGNOSIS — I10 Essential (primary) hypertension: Secondary | ICD-10-CM | POA: Diagnosis not present

## 2022-06-17 DIAGNOSIS — R296 Repeated falls: Secondary | ICD-10-CM | POA: Diagnosis not present

## 2022-06-17 DIAGNOSIS — F0390 Unspecified dementia without behavioral disturbance: Secondary | ICD-10-CM | POA: Diagnosis not present

## 2022-06-17 DIAGNOSIS — I69318 Other symptoms and signs involving cognitive functions following cerebral infarction: Secondary | ICD-10-CM | POA: Diagnosis not present

## 2022-06-17 DIAGNOSIS — Z8616 Personal history of COVID-19: Secondary | ICD-10-CM | POA: Diagnosis not present

## 2022-06-17 DIAGNOSIS — R32 Unspecified urinary incontinence: Secondary | ICD-10-CM | POA: Diagnosis not present

## 2022-06-17 DIAGNOSIS — I10 Essential (primary) hypertension: Secondary | ICD-10-CM | POA: Diagnosis not present

## 2022-06-19 DIAGNOSIS — L84 Corns and callosities: Secondary | ICD-10-CM | POA: Diagnosis not present

## 2022-06-19 DIAGNOSIS — M2041 Other hammer toe(s) (acquired), right foot: Secondary | ICD-10-CM | POA: Diagnosis not present

## 2022-06-19 DIAGNOSIS — B351 Tinea unguium: Secondary | ICD-10-CM | POA: Diagnosis not present

## 2022-06-19 DIAGNOSIS — M79675 Pain in left toe(s): Secondary | ICD-10-CM | POA: Diagnosis not present

## 2022-06-19 DIAGNOSIS — I739 Peripheral vascular disease, unspecified: Secondary | ICD-10-CM | POA: Diagnosis not present

## 2022-06-20 DIAGNOSIS — R32 Unspecified urinary incontinence: Secondary | ICD-10-CM | POA: Diagnosis not present

## 2022-06-20 DIAGNOSIS — I69318 Other symptoms and signs involving cognitive functions following cerebral infarction: Secondary | ICD-10-CM | POA: Diagnosis not present

## 2022-06-20 DIAGNOSIS — Z8616 Personal history of COVID-19: Secondary | ICD-10-CM | POA: Diagnosis not present

## 2022-06-20 DIAGNOSIS — R296 Repeated falls: Secondary | ICD-10-CM | POA: Diagnosis not present

## 2022-06-20 DIAGNOSIS — I10 Essential (primary) hypertension: Secondary | ICD-10-CM | POA: Diagnosis not present

## 2022-06-20 DIAGNOSIS — F0390 Unspecified dementia without behavioral disturbance: Secondary | ICD-10-CM | POA: Diagnosis not present

## 2022-06-23 DIAGNOSIS — Z8616 Personal history of COVID-19: Secondary | ICD-10-CM | POA: Diagnosis not present

## 2022-06-23 DIAGNOSIS — I10 Essential (primary) hypertension: Secondary | ICD-10-CM | POA: Diagnosis not present

## 2022-06-23 DIAGNOSIS — I69318 Other symptoms and signs involving cognitive functions following cerebral infarction: Secondary | ICD-10-CM | POA: Diagnosis not present

## 2022-06-23 DIAGNOSIS — R296 Repeated falls: Secondary | ICD-10-CM | POA: Diagnosis not present

## 2022-06-23 DIAGNOSIS — R32 Unspecified urinary incontinence: Secondary | ICD-10-CM | POA: Diagnosis not present

## 2022-06-23 DIAGNOSIS — F0390 Unspecified dementia without behavioral disturbance: Secondary | ICD-10-CM | POA: Diagnosis not present

## 2022-06-24 DIAGNOSIS — R296 Repeated falls: Secondary | ICD-10-CM | POA: Diagnosis not present

## 2022-06-24 DIAGNOSIS — I10 Essential (primary) hypertension: Secondary | ICD-10-CM | POA: Diagnosis not present

## 2022-06-24 DIAGNOSIS — F0390 Unspecified dementia without behavioral disturbance: Secondary | ICD-10-CM | POA: Diagnosis not present

## 2022-06-24 DIAGNOSIS — R32 Unspecified urinary incontinence: Secondary | ICD-10-CM | POA: Diagnosis not present

## 2022-06-24 DIAGNOSIS — I69318 Other symptoms and signs involving cognitive functions following cerebral infarction: Secondary | ICD-10-CM | POA: Diagnosis not present

## 2022-06-24 DIAGNOSIS — Z8616 Personal history of COVID-19: Secondary | ICD-10-CM | POA: Diagnosis not present

## 2022-06-27 DIAGNOSIS — F0390 Unspecified dementia without behavioral disturbance: Secondary | ICD-10-CM | POA: Diagnosis not present

## 2022-06-27 DIAGNOSIS — I10 Essential (primary) hypertension: Secondary | ICD-10-CM | POA: Diagnosis not present

## 2022-06-27 DIAGNOSIS — R296 Repeated falls: Secondary | ICD-10-CM | POA: Diagnosis not present

## 2022-06-27 DIAGNOSIS — Z8616 Personal history of COVID-19: Secondary | ICD-10-CM | POA: Diagnosis not present

## 2022-06-27 DIAGNOSIS — R32 Unspecified urinary incontinence: Secondary | ICD-10-CM | POA: Diagnosis not present

## 2022-06-27 DIAGNOSIS — I69318 Other symptoms and signs involving cognitive functions following cerebral infarction: Secondary | ICD-10-CM | POA: Diagnosis not present

## 2022-06-30 DIAGNOSIS — F0390 Unspecified dementia without behavioral disturbance: Secondary | ICD-10-CM | POA: Diagnosis not present

## 2022-06-30 DIAGNOSIS — R296 Repeated falls: Secondary | ICD-10-CM | POA: Diagnosis not present

## 2022-06-30 DIAGNOSIS — Z8616 Personal history of COVID-19: Secondary | ICD-10-CM | POA: Diagnosis not present

## 2022-06-30 DIAGNOSIS — I10 Essential (primary) hypertension: Secondary | ICD-10-CM | POA: Diagnosis not present

## 2022-06-30 DIAGNOSIS — I69318 Other symptoms and signs involving cognitive functions following cerebral infarction: Secondary | ICD-10-CM | POA: Diagnosis not present

## 2022-06-30 DIAGNOSIS — R32 Unspecified urinary incontinence: Secondary | ICD-10-CM | POA: Diagnosis not present

## 2022-07-01 DIAGNOSIS — F0390 Unspecified dementia without behavioral disturbance: Secondary | ICD-10-CM | POA: Diagnosis not present

## 2022-07-01 DIAGNOSIS — Z8616 Personal history of COVID-19: Secondary | ICD-10-CM | POA: Diagnosis not present

## 2022-07-01 DIAGNOSIS — I10 Essential (primary) hypertension: Secondary | ICD-10-CM | POA: Diagnosis not present

## 2022-07-01 DIAGNOSIS — R32 Unspecified urinary incontinence: Secondary | ICD-10-CM | POA: Diagnosis not present

## 2022-07-01 DIAGNOSIS — R296 Repeated falls: Secondary | ICD-10-CM | POA: Diagnosis not present

## 2022-07-01 DIAGNOSIS — I69318 Other symptoms and signs involving cognitive functions following cerebral infarction: Secondary | ICD-10-CM | POA: Diagnosis not present

## 2022-07-04 DIAGNOSIS — R32 Unspecified urinary incontinence: Secondary | ICD-10-CM | POA: Diagnosis not present

## 2022-07-04 DIAGNOSIS — Z8616 Personal history of COVID-19: Secondary | ICD-10-CM | POA: Diagnosis not present

## 2022-07-04 DIAGNOSIS — I10 Essential (primary) hypertension: Secondary | ICD-10-CM | POA: Diagnosis not present

## 2022-07-04 DIAGNOSIS — R296 Repeated falls: Secondary | ICD-10-CM | POA: Diagnosis not present

## 2022-07-04 DIAGNOSIS — I69318 Other symptoms and signs involving cognitive functions following cerebral infarction: Secondary | ICD-10-CM | POA: Diagnosis not present

## 2022-07-04 DIAGNOSIS — F0390 Unspecified dementia without behavioral disturbance: Secondary | ICD-10-CM | POA: Diagnosis not present

## 2022-07-07 DIAGNOSIS — R32 Unspecified urinary incontinence: Secondary | ICD-10-CM | POA: Diagnosis not present

## 2022-07-07 DIAGNOSIS — I10 Essential (primary) hypertension: Secondary | ICD-10-CM | POA: Diagnosis not present

## 2022-07-07 DIAGNOSIS — I69318 Other symptoms and signs involving cognitive functions following cerebral infarction: Secondary | ICD-10-CM | POA: Diagnosis not present

## 2022-07-07 DIAGNOSIS — F0390 Unspecified dementia without behavioral disturbance: Secondary | ICD-10-CM | POA: Diagnosis not present

## 2022-07-07 DIAGNOSIS — R296 Repeated falls: Secondary | ICD-10-CM | POA: Diagnosis not present

## 2022-07-07 DIAGNOSIS — Z8616 Personal history of COVID-19: Secondary | ICD-10-CM | POA: Diagnosis not present

## 2022-07-08 DIAGNOSIS — I10 Essential (primary) hypertension: Secondary | ICD-10-CM | POA: Diagnosis not present

## 2022-07-08 DIAGNOSIS — R32 Unspecified urinary incontinence: Secondary | ICD-10-CM | POA: Diagnosis not present

## 2022-07-08 DIAGNOSIS — F0390 Unspecified dementia without behavioral disturbance: Secondary | ICD-10-CM | POA: Diagnosis not present

## 2022-07-08 DIAGNOSIS — Z8616 Personal history of COVID-19: Secondary | ICD-10-CM | POA: Diagnosis not present

## 2022-07-08 DIAGNOSIS — R296 Repeated falls: Secondary | ICD-10-CM | POA: Diagnosis not present

## 2022-07-08 DIAGNOSIS — I69318 Other symptoms and signs involving cognitive functions following cerebral infarction: Secondary | ICD-10-CM | POA: Diagnosis not present

## 2022-07-11 DIAGNOSIS — R296 Repeated falls: Secondary | ICD-10-CM | POA: Diagnosis not present

## 2022-07-11 DIAGNOSIS — R32 Unspecified urinary incontinence: Secondary | ICD-10-CM | POA: Diagnosis not present

## 2022-07-11 DIAGNOSIS — Z8616 Personal history of COVID-19: Secondary | ICD-10-CM | POA: Diagnosis not present

## 2022-07-11 DIAGNOSIS — I10 Essential (primary) hypertension: Secondary | ICD-10-CM | POA: Diagnosis not present

## 2022-07-11 DIAGNOSIS — F0390 Unspecified dementia without behavioral disturbance: Secondary | ICD-10-CM | POA: Diagnosis not present

## 2022-07-11 DIAGNOSIS — I69318 Other symptoms and signs involving cognitive functions following cerebral infarction: Secondary | ICD-10-CM | POA: Diagnosis not present

## 2022-07-14 DIAGNOSIS — R296 Repeated falls: Secondary | ICD-10-CM | POA: Diagnosis not present

## 2022-07-14 DIAGNOSIS — L853 Xerosis cutis: Secondary | ICD-10-CM | POA: Diagnosis not present

## 2022-07-14 DIAGNOSIS — R159 Full incontinence of feces: Secondary | ICD-10-CM | POA: Diagnosis not present

## 2022-07-14 DIAGNOSIS — R32 Unspecified urinary incontinence: Secondary | ICD-10-CM | POA: Diagnosis not present

## 2022-07-14 DIAGNOSIS — Z7401 Bed confinement status: Secondary | ICD-10-CM | POA: Diagnosis not present

## 2022-07-14 DIAGNOSIS — I10 Essential (primary) hypertension: Secondary | ICD-10-CM | POA: Diagnosis not present

## 2022-07-14 DIAGNOSIS — Z681 Body mass index (BMI) 19 or less, adult: Secondary | ICD-10-CM | POA: Diagnosis not present

## 2022-07-14 DIAGNOSIS — F0390 Unspecified dementia without behavioral disturbance: Secondary | ICD-10-CM | POA: Diagnosis not present

## 2022-07-14 DIAGNOSIS — I69318 Other symptoms and signs involving cognitive functions following cerebral infarction: Secondary | ICD-10-CM | POA: Diagnosis not present

## 2022-07-14 DIAGNOSIS — Z8616 Personal history of COVID-19: Secondary | ICD-10-CM | POA: Diagnosis not present

## 2022-07-14 DIAGNOSIS — Z741 Need for assistance with personal care: Secondary | ICD-10-CM | POA: Diagnosis not present

## 2022-07-14 DIAGNOSIS — H04129 Dry eye syndrome of unspecified lacrimal gland: Secondary | ICD-10-CM | POA: Diagnosis not present

## 2022-07-14 DIAGNOSIS — H3530 Unspecified macular degeneration: Secondary | ICD-10-CM | POA: Diagnosis not present

## 2022-07-15 DIAGNOSIS — Z8616 Personal history of COVID-19: Secondary | ICD-10-CM | POA: Diagnosis not present

## 2022-07-15 DIAGNOSIS — I69318 Other symptoms and signs involving cognitive functions following cerebral infarction: Secondary | ICD-10-CM | POA: Diagnosis not present

## 2022-07-15 DIAGNOSIS — I10 Essential (primary) hypertension: Secondary | ICD-10-CM | POA: Diagnosis not present

## 2022-07-15 DIAGNOSIS — R32 Unspecified urinary incontinence: Secondary | ICD-10-CM | POA: Diagnosis not present

## 2022-07-15 DIAGNOSIS — F0390 Unspecified dementia without behavioral disturbance: Secondary | ICD-10-CM | POA: Diagnosis not present

## 2022-07-15 DIAGNOSIS — R296 Repeated falls: Secondary | ICD-10-CM | POA: Diagnosis not present

## 2022-07-18 DIAGNOSIS — R296 Repeated falls: Secondary | ICD-10-CM | POA: Diagnosis not present

## 2022-07-18 DIAGNOSIS — Z8616 Personal history of COVID-19: Secondary | ICD-10-CM | POA: Diagnosis not present

## 2022-07-18 DIAGNOSIS — R32 Unspecified urinary incontinence: Secondary | ICD-10-CM | POA: Diagnosis not present

## 2022-07-18 DIAGNOSIS — F0390 Unspecified dementia without behavioral disturbance: Secondary | ICD-10-CM | POA: Diagnosis not present

## 2022-07-18 DIAGNOSIS — I69318 Other symptoms and signs involving cognitive functions following cerebral infarction: Secondary | ICD-10-CM | POA: Diagnosis not present

## 2022-07-18 DIAGNOSIS — I10 Essential (primary) hypertension: Secondary | ICD-10-CM | POA: Diagnosis not present

## 2022-07-21 DIAGNOSIS — F0390 Unspecified dementia without behavioral disturbance: Secondary | ICD-10-CM | POA: Diagnosis not present

## 2022-07-21 DIAGNOSIS — I69318 Other symptoms and signs involving cognitive functions following cerebral infarction: Secondary | ICD-10-CM | POA: Diagnosis not present

## 2022-07-21 DIAGNOSIS — Z8616 Personal history of COVID-19: Secondary | ICD-10-CM | POA: Diagnosis not present

## 2022-07-21 DIAGNOSIS — R32 Unspecified urinary incontinence: Secondary | ICD-10-CM | POA: Diagnosis not present

## 2022-07-21 DIAGNOSIS — R296 Repeated falls: Secondary | ICD-10-CM | POA: Diagnosis not present

## 2022-07-21 DIAGNOSIS — I10 Essential (primary) hypertension: Secondary | ICD-10-CM | POA: Diagnosis not present

## 2022-07-22 DIAGNOSIS — R296 Repeated falls: Secondary | ICD-10-CM | POA: Diagnosis not present

## 2022-07-22 DIAGNOSIS — Z8616 Personal history of COVID-19: Secondary | ICD-10-CM | POA: Diagnosis not present

## 2022-07-22 DIAGNOSIS — F0390 Unspecified dementia without behavioral disturbance: Secondary | ICD-10-CM | POA: Diagnosis not present

## 2022-07-22 DIAGNOSIS — R32 Unspecified urinary incontinence: Secondary | ICD-10-CM | POA: Diagnosis not present

## 2022-07-22 DIAGNOSIS — I10 Essential (primary) hypertension: Secondary | ICD-10-CM | POA: Diagnosis not present

## 2022-07-22 DIAGNOSIS — I69318 Other symptoms and signs involving cognitive functions following cerebral infarction: Secondary | ICD-10-CM | POA: Diagnosis not present

## 2022-07-25 DIAGNOSIS — I69318 Other symptoms and signs involving cognitive functions following cerebral infarction: Secondary | ICD-10-CM | POA: Diagnosis not present

## 2022-07-25 DIAGNOSIS — Z8616 Personal history of COVID-19: Secondary | ICD-10-CM | POA: Diagnosis not present

## 2022-07-25 DIAGNOSIS — R296 Repeated falls: Secondary | ICD-10-CM | POA: Diagnosis not present

## 2022-07-25 DIAGNOSIS — I10 Essential (primary) hypertension: Secondary | ICD-10-CM | POA: Diagnosis not present

## 2022-07-25 DIAGNOSIS — F0390 Unspecified dementia without behavioral disturbance: Secondary | ICD-10-CM | POA: Diagnosis not present

## 2022-07-25 DIAGNOSIS — R32 Unspecified urinary incontinence: Secondary | ICD-10-CM | POA: Diagnosis not present

## 2022-07-28 DIAGNOSIS — R296 Repeated falls: Secondary | ICD-10-CM | POA: Diagnosis not present

## 2022-07-28 DIAGNOSIS — I69318 Other symptoms and signs involving cognitive functions following cerebral infarction: Secondary | ICD-10-CM | POA: Diagnosis not present

## 2022-07-28 DIAGNOSIS — R32 Unspecified urinary incontinence: Secondary | ICD-10-CM | POA: Diagnosis not present

## 2022-07-28 DIAGNOSIS — Z8616 Personal history of COVID-19: Secondary | ICD-10-CM | POA: Diagnosis not present

## 2022-07-28 DIAGNOSIS — F0390 Unspecified dementia without behavioral disturbance: Secondary | ICD-10-CM | POA: Diagnosis not present

## 2022-07-28 DIAGNOSIS — I10 Essential (primary) hypertension: Secondary | ICD-10-CM | POA: Diagnosis not present

## 2022-07-29 DIAGNOSIS — I69318 Other symptoms and signs involving cognitive functions following cerebral infarction: Secondary | ICD-10-CM | POA: Diagnosis not present

## 2022-07-29 DIAGNOSIS — R296 Repeated falls: Secondary | ICD-10-CM | POA: Diagnosis not present

## 2022-07-29 DIAGNOSIS — R32 Unspecified urinary incontinence: Secondary | ICD-10-CM | POA: Diagnosis not present

## 2022-07-29 DIAGNOSIS — Z8616 Personal history of COVID-19: Secondary | ICD-10-CM | POA: Diagnosis not present

## 2022-07-29 DIAGNOSIS — F0390 Unspecified dementia without behavioral disturbance: Secondary | ICD-10-CM | POA: Diagnosis not present

## 2022-07-29 DIAGNOSIS — I10 Essential (primary) hypertension: Secondary | ICD-10-CM | POA: Diagnosis not present

## 2022-07-31 DIAGNOSIS — Z8616 Personal history of COVID-19: Secondary | ICD-10-CM | POA: Diagnosis not present

## 2022-07-31 DIAGNOSIS — R32 Unspecified urinary incontinence: Secondary | ICD-10-CM | POA: Diagnosis not present

## 2022-07-31 DIAGNOSIS — I10 Essential (primary) hypertension: Secondary | ICD-10-CM | POA: Diagnosis not present

## 2022-07-31 DIAGNOSIS — F0390 Unspecified dementia without behavioral disturbance: Secondary | ICD-10-CM | POA: Diagnosis not present

## 2022-07-31 DIAGNOSIS — R296 Repeated falls: Secondary | ICD-10-CM | POA: Diagnosis not present

## 2022-07-31 DIAGNOSIS — I69318 Other symptoms and signs involving cognitive functions following cerebral infarction: Secondary | ICD-10-CM | POA: Diagnosis not present

## 2022-08-01 DIAGNOSIS — F0390 Unspecified dementia without behavioral disturbance: Secondary | ICD-10-CM | POA: Diagnosis not present

## 2022-08-01 DIAGNOSIS — I69318 Other symptoms and signs involving cognitive functions following cerebral infarction: Secondary | ICD-10-CM | POA: Diagnosis not present

## 2022-08-01 DIAGNOSIS — Z8616 Personal history of COVID-19: Secondary | ICD-10-CM | POA: Diagnosis not present

## 2022-08-01 DIAGNOSIS — R32 Unspecified urinary incontinence: Secondary | ICD-10-CM | POA: Diagnosis not present

## 2022-08-01 DIAGNOSIS — I10 Essential (primary) hypertension: Secondary | ICD-10-CM | POA: Diagnosis not present

## 2022-08-01 DIAGNOSIS — R296 Repeated falls: Secondary | ICD-10-CM | POA: Diagnosis not present

## 2022-08-04 DIAGNOSIS — I69318 Other symptoms and signs involving cognitive functions following cerebral infarction: Secondary | ICD-10-CM | POA: Diagnosis not present

## 2022-08-04 DIAGNOSIS — I10 Essential (primary) hypertension: Secondary | ICD-10-CM | POA: Diagnosis not present

## 2022-08-04 DIAGNOSIS — R296 Repeated falls: Secondary | ICD-10-CM | POA: Diagnosis not present

## 2022-08-04 DIAGNOSIS — F0390 Unspecified dementia without behavioral disturbance: Secondary | ICD-10-CM | POA: Diagnosis not present

## 2022-08-04 DIAGNOSIS — Z8616 Personal history of COVID-19: Secondary | ICD-10-CM | POA: Diagnosis not present

## 2022-08-04 DIAGNOSIS — R32 Unspecified urinary incontinence: Secondary | ICD-10-CM | POA: Diagnosis not present

## 2022-08-06 DIAGNOSIS — I69318 Other symptoms and signs involving cognitive functions following cerebral infarction: Secondary | ICD-10-CM | POA: Diagnosis not present

## 2022-08-06 DIAGNOSIS — I10 Essential (primary) hypertension: Secondary | ICD-10-CM | POA: Diagnosis not present

## 2022-08-06 DIAGNOSIS — F0390 Unspecified dementia without behavioral disturbance: Secondary | ICD-10-CM | POA: Diagnosis not present

## 2022-08-06 DIAGNOSIS — Z8616 Personal history of COVID-19: Secondary | ICD-10-CM | POA: Diagnosis not present

## 2022-08-06 DIAGNOSIS — R32 Unspecified urinary incontinence: Secondary | ICD-10-CM | POA: Diagnosis not present

## 2022-08-06 DIAGNOSIS — R296 Repeated falls: Secondary | ICD-10-CM | POA: Diagnosis not present

## 2022-08-08 DIAGNOSIS — R32 Unspecified urinary incontinence: Secondary | ICD-10-CM | POA: Diagnosis not present

## 2022-08-08 DIAGNOSIS — I69318 Other symptoms and signs involving cognitive functions following cerebral infarction: Secondary | ICD-10-CM | POA: Diagnosis not present

## 2022-08-08 DIAGNOSIS — Z8616 Personal history of COVID-19: Secondary | ICD-10-CM | POA: Diagnosis not present

## 2022-08-08 DIAGNOSIS — I10 Essential (primary) hypertension: Secondary | ICD-10-CM | POA: Diagnosis not present

## 2022-08-08 DIAGNOSIS — R296 Repeated falls: Secondary | ICD-10-CM | POA: Diagnosis not present

## 2022-08-08 DIAGNOSIS — F0390 Unspecified dementia without behavioral disturbance: Secondary | ICD-10-CM | POA: Diagnosis not present

## 2022-08-11 DIAGNOSIS — I10 Essential (primary) hypertension: Secondary | ICD-10-CM | POA: Diagnosis not present

## 2022-08-11 DIAGNOSIS — R32 Unspecified urinary incontinence: Secondary | ICD-10-CM | POA: Diagnosis not present

## 2022-08-11 DIAGNOSIS — Z8616 Personal history of COVID-19: Secondary | ICD-10-CM | POA: Diagnosis not present

## 2022-08-11 DIAGNOSIS — I69318 Other symptoms and signs involving cognitive functions following cerebral infarction: Secondary | ICD-10-CM | POA: Diagnosis not present

## 2022-08-11 DIAGNOSIS — R296 Repeated falls: Secondary | ICD-10-CM | POA: Diagnosis not present

## 2022-08-11 DIAGNOSIS — F0390 Unspecified dementia without behavioral disturbance: Secondary | ICD-10-CM | POA: Diagnosis not present

## 2022-08-12 DIAGNOSIS — F0390 Unspecified dementia without behavioral disturbance: Secondary | ICD-10-CM | POA: Diagnosis not present

## 2022-08-12 DIAGNOSIS — I10 Essential (primary) hypertension: Secondary | ICD-10-CM | POA: Diagnosis not present

## 2022-08-12 DIAGNOSIS — R296 Repeated falls: Secondary | ICD-10-CM | POA: Diagnosis not present

## 2022-08-12 DIAGNOSIS — R32 Unspecified urinary incontinence: Secondary | ICD-10-CM | POA: Diagnosis not present

## 2022-08-12 DIAGNOSIS — I69318 Other symptoms and signs involving cognitive functions following cerebral infarction: Secondary | ICD-10-CM | POA: Diagnosis not present

## 2022-08-12 DIAGNOSIS — Z8616 Personal history of COVID-19: Secondary | ICD-10-CM | POA: Diagnosis not present

## 2022-08-13 DIAGNOSIS — Z8616 Personal history of COVID-19: Secondary | ICD-10-CM | POA: Diagnosis not present

## 2022-08-13 DIAGNOSIS — Z7401 Bed confinement status: Secondary | ICD-10-CM | POA: Diagnosis not present

## 2022-08-13 DIAGNOSIS — I10 Essential (primary) hypertension: Secondary | ICD-10-CM | POA: Diagnosis not present

## 2022-08-13 DIAGNOSIS — Z741 Need for assistance with personal care: Secondary | ICD-10-CM | POA: Diagnosis not present

## 2022-08-13 DIAGNOSIS — I69318 Other symptoms and signs involving cognitive functions following cerebral infarction: Secondary | ICD-10-CM | POA: Diagnosis not present

## 2022-08-13 DIAGNOSIS — R159 Full incontinence of feces: Secondary | ICD-10-CM | POA: Diagnosis not present

## 2022-08-13 DIAGNOSIS — H04129 Dry eye syndrome of unspecified lacrimal gland: Secondary | ICD-10-CM | POA: Diagnosis not present

## 2022-08-13 DIAGNOSIS — R21 Rash and other nonspecific skin eruption: Secondary | ICD-10-CM | POA: Diagnosis not present

## 2022-08-13 DIAGNOSIS — Z6823 Body mass index (BMI) 23.0-23.9, adult: Secondary | ICD-10-CM | POA: Diagnosis not present

## 2022-08-13 DIAGNOSIS — H3530 Unspecified macular degeneration: Secondary | ICD-10-CM | POA: Diagnosis not present

## 2022-08-13 DIAGNOSIS — F0390 Unspecified dementia without behavioral disturbance: Secondary | ICD-10-CM | POA: Diagnosis not present

## 2022-08-13 DIAGNOSIS — R32 Unspecified urinary incontinence: Secondary | ICD-10-CM | POA: Diagnosis not present

## 2022-08-13 DIAGNOSIS — R296 Repeated falls: Secondary | ICD-10-CM | POA: Diagnosis not present

## 2022-08-13 DIAGNOSIS — L853 Xerosis cutis: Secondary | ICD-10-CM | POA: Diagnosis not present

## 2022-08-15 DIAGNOSIS — I69318 Other symptoms and signs involving cognitive functions following cerebral infarction: Secondary | ICD-10-CM | POA: Diagnosis not present

## 2022-08-15 DIAGNOSIS — R296 Repeated falls: Secondary | ICD-10-CM | POA: Diagnosis not present

## 2022-08-15 DIAGNOSIS — Z8616 Personal history of COVID-19: Secondary | ICD-10-CM | POA: Diagnosis not present

## 2022-08-15 DIAGNOSIS — R32 Unspecified urinary incontinence: Secondary | ICD-10-CM | POA: Diagnosis not present

## 2022-08-15 DIAGNOSIS — I10 Essential (primary) hypertension: Secondary | ICD-10-CM | POA: Diagnosis not present

## 2022-08-15 DIAGNOSIS — F0390 Unspecified dementia without behavioral disturbance: Secondary | ICD-10-CM | POA: Diagnosis not present

## 2022-08-18 DIAGNOSIS — I10 Essential (primary) hypertension: Secondary | ICD-10-CM | POA: Diagnosis not present

## 2022-08-18 DIAGNOSIS — R32 Unspecified urinary incontinence: Secondary | ICD-10-CM | POA: Diagnosis not present

## 2022-08-18 DIAGNOSIS — F0390 Unspecified dementia without behavioral disturbance: Secondary | ICD-10-CM | POA: Diagnosis not present

## 2022-08-18 DIAGNOSIS — R296 Repeated falls: Secondary | ICD-10-CM | POA: Diagnosis not present

## 2022-08-18 DIAGNOSIS — Z8616 Personal history of COVID-19: Secondary | ICD-10-CM | POA: Diagnosis not present

## 2022-08-18 DIAGNOSIS — I69318 Other symptoms and signs involving cognitive functions following cerebral infarction: Secondary | ICD-10-CM | POA: Diagnosis not present

## 2022-08-19 DIAGNOSIS — R32 Unspecified urinary incontinence: Secondary | ICD-10-CM | POA: Diagnosis not present

## 2022-08-19 DIAGNOSIS — Z8616 Personal history of COVID-19: Secondary | ICD-10-CM | POA: Diagnosis not present

## 2022-08-19 DIAGNOSIS — I10 Essential (primary) hypertension: Secondary | ICD-10-CM | POA: Diagnosis not present

## 2022-08-19 DIAGNOSIS — I69318 Other symptoms and signs involving cognitive functions following cerebral infarction: Secondary | ICD-10-CM | POA: Diagnosis not present

## 2022-08-19 DIAGNOSIS — R296 Repeated falls: Secondary | ICD-10-CM | POA: Diagnosis not present

## 2022-08-19 DIAGNOSIS — F0390 Unspecified dementia without behavioral disturbance: Secondary | ICD-10-CM | POA: Diagnosis not present

## 2022-08-22 DIAGNOSIS — R32 Unspecified urinary incontinence: Secondary | ICD-10-CM | POA: Diagnosis not present

## 2022-08-22 DIAGNOSIS — I69318 Other symptoms and signs involving cognitive functions following cerebral infarction: Secondary | ICD-10-CM | POA: Diagnosis not present

## 2022-08-22 DIAGNOSIS — Z8616 Personal history of COVID-19: Secondary | ICD-10-CM | POA: Diagnosis not present

## 2022-08-22 DIAGNOSIS — F0390 Unspecified dementia without behavioral disturbance: Secondary | ICD-10-CM | POA: Diagnosis not present

## 2022-08-22 DIAGNOSIS — I10 Essential (primary) hypertension: Secondary | ICD-10-CM | POA: Diagnosis not present

## 2022-08-22 DIAGNOSIS — R296 Repeated falls: Secondary | ICD-10-CM | POA: Diagnosis not present

## 2022-08-25 DIAGNOSIS — R296 Repeated falls: Secondary | ICD-10-CM | POA: Diagnosis not present

## 2022-08-25 DIAGNOSIS — Z8616 Personal history of COVID-19: Secondary | ICD-10-CM | POA: Diagnosis not present

## 2022-08-25 DIAGNOSIS — R32 Unspecified urinary incontinence: Secondary | ICD-10-CM | POA: Diagnosis not present

## 2022-08-25 DIAGNOSIS — I10 Essential (primary) hypertension: Secondary | ICD-10-CM | POA: Diagnosis not present

## 2022-08-25 DIAGNOSIS — I69318 Other symptoms and signs involving cognitive functions following cerebral infarction: Secondary | ICD-10-CM | POA: Diagnosis not present

## 2022-08-25 DIAGNOSIS — F0390 Unspecified dementia without behavioral disturbance: Secondary | ICD-10-CM | POA: Diagnosis not present

## 2022-08-26 DIAGNOSIS — R296 Repeated falls: Secondary | ICD-10-CM | POA: Diagnosis not present

## 2022-08-26 DIAGNOSIS — I69318 Other symptoms and signs involving cognitive functions following cerebral infarction: Secondary | ICD-10-CM | POA: Diagnosis not present

## 2022-08-26 DIAGNOSIS — R32 Unspecified urinary incontinence: Secondary | ICD-10-CM | POA: Diagnosis not present

## 2022-08-26 DIAGNOSIS — Z8616 Personal history of COVID-19: Secondary | ICD-10-CM | POA: Diagnosis not present

## 2022-08-26 DIAGNOSIS — F0390 Unspecified dementia without behavioral disturbance: Secondary | ICD-10-CM | POA: Diagnosis not present

## 2022-08-26 DIAGNOSIS — I10 Essential (primary) hypertension: Secondary | ICD-10-CM | POA: Diagnosis not present

## 2022-08-29 DIAGNOSIS — I69318 Other symptoms and signs involving cognitive functions following cerebral infarction: Secondary | ICD-10-CM | POA: Diagnosis not present

## 2022-08-29 DIAGNOSIS — Z8616 Personal history of COVID-19: Secondary | ICD-10-CM | POA: Diagnosis not present

## 2022-08-29 DIAGNOSIS — I10 Essential (primary) hypertension: Secondary | ICD-10-CM | POA: Diagnosis not present

## 2022-08-29 DIAGNOSIS — R32 Unspecified urinary incontinence: Secondary | ICD-10-CM | POA: Diagnosis not present

## 2022-08-29 DIAGNOSIS — R296 Repeated falls: Secondary | ICD-10-CM | POA: Diagnosis not present

## 2022-08-29 DIAGNOSIS — F0390 Unspecified dementia without behavioral disturbance: Secondary | ICD-10-CM | POA: Diagnosis not present

## 2022-09-01 DIAGNOSIS — R32 Unspecified urinary incontinence: Secondary | ICD-10-CM | POA: Diagnosis not present

## 2022-09-01 DIAGNOSIS — R296 Repeated falls: Secondary | ICD-10-CM | POA: Diagnosis not present

## 2022-09-01 DIAGNOSIS — F0390 Unspecified dementia without behavioral disturbance: Secondary | ICD-10-CM | POA: Diagnosis not present

## 2022-09-01 DIAGNOSIS — I69318 Other symptoms and signs involving cognitive functions following cerebral infarction: Secondary | ICD-10-CM | POA: Diagnosis not present

## 2022-09-01 DIAGNOSIS — I10 Essential (primary) hypertension: Secondary | ICD-10-CM | POA: Diagnosis not present

## 2022-09-01 DIAGNOSIS — Z8616 Personal history of COVID-19: Secondary | ICD-10-CM | POA: Diagnosis not present

## 2022-09-02 DIAGNOSIS — R32 Unspecified urinary incontinence: Secondary | ICD-10-CM | POA: Diagnosis not present

## 2022-09-02 DIAGNOSIS — Z8616 Personal history of COVID-19: Secondary | ICD-10-CM | POA: Diagnosis not present

## 2022-09-02 DIAGNOSIS — F0390 Unspecified dementia without behavioral disturbance: Secondary | ICD-10-CM | POA: Diagnosis not present

## 2022-09-02 DIAGNOSIS — I69318 Other symptoms and signs involving cognitive functions following cerebral infarction: Secondary | ICD-10-CM | POA: Diagnosis not present

## 2022-09-02 DIAGNOSIS — I10 Essential (primary) hypertension: Secondary | ICD-10-CM | POA: Diagnosis not present

## 2022-09-02 DIAGNOSIS — R296 Repeated falls: Secondary | ICD-10-CM | POA: Diagnosis not present

## 2022-09-05 DIAGNOSIS — F0390 Unspecified dementia without behavioral disturbance: Secondary | ICD-10-CM | POA: Diagnosis not present

## 2022-09-05 DIAGNOSIS — R296 Repeated falls: Secondary | ICD-10-CM | POA: Diagnosis not present

## 2022-09-05 DIAGNOSIS — I69318 Other symptoms and signs involving cognitive functions following cerebral infarction: Secondary | ICD-10-CM | POA: Diagnosis not present

## 2022-09-05 DIAGNOSIS — R32 Unspecified urinary incontinence: Secondary | ICD-10-CM | POA: Diagnosis not present

## 2022-09-05 DIAGNOSIS — I10 Essential (primary) hypertension: Secondary | ICD-10-CM | POA: Diagnosis not present

## 2022-09-05 DIAGNOSIS — Z8616 Personal history of COVID-19: Secondary | ICD-10-CM | POA: Diagnosis not present

## 2022-09-08 DIAGNOSIS — R296 Repeated falls: Secondary | ICD-10-CM | POA: Diagnosis not present

## 2022-09-08 DIAGNOSIS — F0390 Unspecified dementia without behavioral disturbance: Secondary | ICD-10-CM | POA: Diagnosis not present

## 2022-09-08 DIAGNOSIS — Z8616 Personal history of COVID-19: Secondary | ICD-10-CM | POA: Diagnosis not present

## 2022-09-08 DIAGNOSIS — I69318 Other symptoms and signs involving cognitive functions following cerebral infarction: Secondary | ICD-10-CM | POA: Diagnosis not present

## 2022-09-08 DIAGNOSIS — I10 Essential (primary) hypertension: Secondary | ICD-10-CM | POA: Diagnosis not present

## 2022-09-08 DIAGNOSIS — R32 Unspecified urinary incontinence: Secondary | ICD-10-CM | POA: Diagnosis not present

## 2022-09-09 DIAGNOSIS — F0390 Unspecified dementia without behavioral disturbance: Secondary | ICD-10-CM | POA: Diagnosis not present

## 2022-09-09 DIAGNOSIS — R32 Unspecified urinary incontinence: Secondary | ICD-10-CM | POA: Diagnosis not present

## 2022-09-09 DIAGNOSIS — I69318 Other symptoms and signs involving cognitive functions following cerebral infarction: Secondary | ICD-10-CM | POA: Diagnosis not present

## 2022-09-09 DIAGNOSIS — R296 Repeated falls: Secondary | ICD-10-CM | POA: Diagnosis not present

## 2022-09-09 DIAGNOSIS — I10 Essential (primary) hypertension: Secondary | ICD-10-CM | POA: Diagnosis not present

## 2022-09-09 DIAGNOSIS — Z8616 Personal history of COVID-19: Secondary | ICD-10-CM | POA: Diagnosis not present

## 2022-09-12 DIAGNOSIS — R32 Unspecified urinary incontinence: Secondary | ICD-10-CM | POA: Diagnosis not present

## 2022-09-12 DIAGNOSIS — I69318 Other symptoms and signs involving cognitive functions following cerebral infarction: Secondary | ICD-10-CM | POA: Diagnosis not present

## 2022-09-12 DIAGNOSIS — F0390 Unspecified dementia without behavioral disturbance: Secondary | ICD-10-CM | POA: Diagnosis not present

## 2022-09-12 DIAGNOSIS — R296 Repeated falls: Secondary | ICD-10-CM | POA: Diagnosis not present

## 2022-09-12 DIAGNOSIS — Z8616 Personal history of COVID-19: Secondary | ICD-10-CM | POA: Diagnosis not present

## 2022-09-12 DIAGNOSIS — I10 Essential (primary) hypertension: Secondary | ICD-10-CM | POA: Diagnosis not present

## 2022-09-13 DIAGNOSIS — F0390 Unspecified dementia without behavioral disturbance: Secondary | ICD-10-CM | POA: Diagnosis not present

## 2022-09-13 DIAGNOSIS — R21 Rash and other nonspecific skin eruption: Secondary | ICD-10-CM | POA: Diagnosis not present

## 2022-09-13 DIAGNOSIS — R296 Repeated falls: Secondary | ICD-10-CM | POA: Diagnosis not present

## 2022-09-13 DIAGNOSIS — R32 Unspecified urinary incontinence: Secondary | ICD-10-CM | POA: Diagnosis not present

## 2022-09-13 DIAGNOSIS — I69318 Other symptoms and signs involving cognitive functions following cerebral infarction: Secondary | ICD-10-CM | POA: Diagnosis not present

## 2022-09-13 DIAGNOSIS — H04129 Dry eye syndrome of unspecified lacrimal gland: Secondary | ICD-10-CM | POA: Diagnosis not present

## 2022-09-13 DIAGNOSIS — R159 Full incontinence of feces: Secondary | ICD-10-CM | POA: Diagnosis not present

## 2022-09-13 DIAGNOSIS — Z741 Need for assistance with personal care: Secondary | ICD-10-CM | POA: Diagnosis not present

## 2022-09-13 DIAGNOSIS — L853 Xerosis cutis: Secondary | ICD-10-CM | POA: Diagnosis not present

## 2022-09-13 DIAGNOSIS — H3530 Unspecified macular degeneration: Secondary | ICD-10-CM | POA: Diagnosis not present

## 2022-09-13 DIAGNOSIS — Z6823 Body mass index (BMI) 23.0-23.9, adult: Secondary | ICD-10-CM | POA: Diagnosis not present

## 2022-09-13 DIAGNOSIS — I10 Essential (primary) hypertension: Secondary | ICD-10-CM | POA: Diagnosis not present

## 2022-09-13 DIAGNOSIS — Z7401 Bed confinement status: Secondary | ICD-10-CM | POA: Diagnosis not present

## 2022-09-13 DIAGNOSIS — Z8616 Personal history of COVID-19: Secondary | ICD-10-CM | POA: Diagnosis not present

## 2022-09-15 DIAGNOSIS — R296 Repeated falls: Secondary | ICD-10-CM | POA: Diagnosis not present

## 2022-09-15 DIAGNOSIS — F0390 Unspecified dementia without behavioral disturbance: Secondary | ICD-10-CM | POA: Diagnosis not present

## 2022-09-15 DIAGNOSIS — Z8616 Personal history of COVID-19: Secondary | ICD-10-CM | POA: Diagnosis not present

## 2022-09-15 DIAGNOSIS — I69318 Other symptoms and signs involving cognitive functions following cerebral infarction: Secondary | ICD-10-CM | POA: Diagnosis not present

## 2022-09-15 DIAGNOSIS — R32 Unspecified urinary incontinence: Secondary | ICD-10-CM | POA: Diagnosis not present

## 2022-09-15 DIAGNOSIS — I10 Essential (primary) hypertension: Secondary | ICD-10-CM | POA: Diagnosis not present

## 2022-09-16 DIAGNOSIS — I10 Essential (primary) hypertension: Secondary | ICD-10-CM | POA: Diagnosis not present

## 2022-09-16 DIAGNOSIS — I69318 Other symptoms and signs involving cognitive functions following cerebral infarction: Secondary | ICD-10-CM | POA: Diagnosis not present

## 2022-09-16 DIAGNOSIS — F0390 Unspecified dementia without behavioral disturbance: Secondary | ICD-10-CM | POA: Diagnosis not present

## 2022-09-16 DIAGNOSIS — R296 Repeated falls: Secondary | ICD-10-CM | POA: Diagnosis not present

## 2022-09-16 DIAGNOSIS — Z8616 Personal history of COVID-19: Secondary | ICD-10-CM | POA: Diagnosis not present

## 2022-09-16 DIAGNOSIS — R32 Unspecified urinary incontinence: Secondary | ICD-10-CM | POA: Diagnosis not present

## 2022-09-18 DIAGNOSIS — B351 Tinea unguium: Secondary | ICD-10-CM | POA: Diagnosis not present

## 2022-09-18 DIAGNOSIS — R296 Repeated falls: Secondary | ICD-10-CM | POA: Diagnosis not present

## 2022-09-18 DIAGNOSIS — I69318 Other symptoms and signs involving cognitive functions following cerebral infarction: Secondary | ICD-10-CM | POA: Diagnosis not present

## 2022-09-18 DIAGNOSIS — Z8616 Personal history of COVID-19: Secondary | ICD-10-CM | POA: Diagnosis not present

## 2022-09-18 DIAGNOSIS — I739 Peripheral vascular disease, unspecified: Secondary | ICD-10-CM | POA: Diagnosis not present

## 2022-09-18 DIAGNOSIS — I10 Essential (primary) hypertension: Secondary | ICD-10-CM | POA: Diagnosis not present

## 2022-09-18 DIAGNOSIS — M2041 Other hammer toe(s) (acquired), right foot: Secondary | ICD-10-CM | POA: Diagnosis not present

## 2022-09-18 DIAGNOSIS — F0390 Unspecified dementia without behavioral disturbance: Secondary | ICD-10-CM | POA: Diagnosis not present

## 2022-09-18 DIAGNOSIS — L84 Corns and callosities: Secondary | ICD-10-CM | POA: Diagnosis not present

## 2022-09-18 DIAGNOSIS — M79675 Pain in left toe(s): Secondary | ICD-10-CM | POA: Diagnosis not present

## 2022-09-18 DIAGNOSIS — R32 Unspecified urinary incontinence: Secondary | ICD-10-CM | POA: Diagnosis not present

## 2022-09-19 DIAGNOSIS — I69318 Other symptoms and signs involving cognitive functions following cerebral infarction: Secondary | ICD-10-CM | POA: Diagnosis not present

## 2022-09-19 DIAGNOSIS — I10 Essential (primary) hypertension: Secondary | ICD-10-CM | POA: Diagnosis not present

## 2022-09-19 DIAGNOSIS — R296 Repeated falls: Secondary | ICD-10-CM | POA: Diagnosis not present

## 2022-09-19 DIAGNOSIS — R32 Unspecified urinary incontinence: Secondary | ICD-10-CM | POA: Diagnosis not present

## 2022-09-19 DIAGNOSIS — Z8616 Personal history of COVID-19: Secondary | ICD-10-CM | POA: Diagnosis not present

## 2022-09-19 DIAGNOSIS — F0390 Unspecified dementia without behavioral disturbance: Secondary | ICD-10-CM | POA: Diagnosis not present

## 2022-09-22 DIAGNOSIS — I69318 Other symptoms and signs involving cognitive functions following cerebral infarction: Secondary | ICD-10-CM | POA: Diagnosis not present

## 2022-09-22 DIAGNOSIS — R32 Unspecified urinary incontinence: Secondary | ICD-10-CM | POA: Diagnosis not present

## 2022-09-22 DIAGNOSIS — F0390 Unspecified dementia without behavioral disturbance: Secondary | ICD-10-CM | POA: Diagnosis not present

## 2022-09-22 DIAGNOSIS — I10 Essential (primary) hypertension: Secondary | ICD-10-CM | POA: Diagnosis not present

## 2022-09-22 DIAGNOSIS — Z8616 Personal history of COVID-19: Secondary | ICD-10-CM | POA: Diagnosis not present

## 2022-09-22 DIAGNOSIS — R296 Repeated falls: Secondary | ICD-10-CM | POA: Diagnosis not present

## 2022-09-23 DIAGNOSIS — R32 Unspecified urinary incontinence: Secondary | ICD-10-CM | POA: Diagnosis not present

## 2022-09-23 DIAGNOSIS — Z8616 Personal history of COVID-19: Secondary | ICD-10-CM | POA: Diagnosis not present

## 2022-09-23 DIAGNOSIS — F0390 Unspecified dementia without behavioral disturbance: Secondary | ICD-10-CM | POA: Diagnosis not present

## 2022-09-23 DIAGNOSIS — I10 Essential (primary) hypertension: Secondary | ICD-10-CM | POA: Diagnosis not present

## 2022-09-23 DIAGNOSIS — R296 Repeated falls: Secondary | ICD-10-CM | POA: Diagnosis not present

## 2022-09-23 DIAGNOSIS — I69318 Other symptoms and signs involving cognitive functions following cerebral infarction: Secondary | ICD-10-CM | POA: Diagnosis not present

## 2022-09-26 DIAGNOSIS — F0390 Unspecified dementia without behavioral disturbance: Secondary | ICD-10-CM | POA: Diagnosis not present

## 2022-09-26 DIAGNOSIS — R32 Unspecified urinary incontinence: Secondary | ICD-10-CM | POA: Diagnosis not present

## 2022-09-26 DIAGNOSIS — I10 Essential (primary) hypertension: Secondary | ICD-10-CM | POA: Diagnosis not present

## 2022-09-26 DIAGNOSIS — R296 Repeated falls: Secondary | ICD-10-CM | POA: Diagnosis not present

## 2022-09-26 DIAGNOSIS — I69318 Other symptoms and signs involving cognitive functions following cerebral infarction: Secondary | ICD-10-CM | POA: Diagnosis not present

## 2022-09-26 DIAGNOSIS — Z8616 Personal history of COVID-19: Secondary | ICD-10-CM | POA: Diagnosis not present

## 2022-09-29 DIAGNOSIS — I10 Essential (primary) hypertension: Secondary | ICD-10-CM | POA: Diagnosis not present

## 2022-09-29 DIAGNOSIS — I69318 Other symptoms and signs involving cognitive functions following cerebral infarction: Secondary | ICD-10-CM | POA: Diagnosis not present

## 2022-09-29 DIAGNOSIS — R296 Repeated falls: Secondary | ICD-10-CM | POA: Diagnosis not present

## 2022-09-29 DIAGNOSIS — Z8616 Personal history of COVID-19: Secondary | ICD-10-CM | POA: Diagnosis not present

## 2022-09-29 DIAGNOSIS — F0390 Unspecified dementia without behavioral disturbance: Secondary | ICD-10-CM | POA: Diagnosis not present

## 2022-09-29 DIAGNOSIS — R32 Unspecified urinary incontinence: Secondary | ICD-10-CM | POA: Diagnosis not present

## 2022-09-30 DIAGNOSIS — R32 Unspecified urinary incontinence: Secondary | ICD-10-CM | POA: Diagnosis not present

## 2022-09-30 DIAGNOSIS — R296 Repeated falls: Secondary | ICD-10-CM | POA: Diagnosis not present

## 2022-09-30 DIAGNOSIS — I10 Essential (primary) hypertension: Secondary | ICD-10-CM | POA: Diagnosis not present

## 2022-09-30 DIAGNOSIS — Z8616 Personal history of COVID-19: Secondary | ICD-10-CM | POA: Diagnosis not present

## 2022-09-30 DIAGNOSIS — I69318 Other symptoms and signs involving cognitive functions following cerebral infarction: Secondary | ICD-10-CM | POA: Diagnosis not present

## 2022-09-30 DIAGNOSIS — F0390 Unspecified dementia without behavioral disturbance: Secondary | ICD-10-CM | POA: Diagnosis not present

## 2022-10-03 DIAGNOSIS — R32 Unspecified urinary incontinence: Secondary | ICD-10-CM | POA: Diagnosis not present

## 2022-10-03 DIAGNOSIS — F0390 Unspecified dementia without behavioral disturbance: Secondary | ICD-10-CM | POA: Diagnosis not present

## 2022-10-03 DIAGNOSIS — R296 Repeated falls: Secondary | ICD-10-CM | POA: Diagnosis not present

## 2022-10-03 DIAGNOSIS — I69318 Other symptoms and signs involving cognitive functions following cerebral infarction: Secondary | ICD-10-CM | POA: Diagnosis not present

## 2022-10-03 DIAGNOSIS — Z8616 Personal history of COVID-19: Secondary | ICD-10-CM | POA: Diagnosis not present

## 2022-10-03 DIAGNOSIS — I10 Essential (primary) hypertension: Secondary | ICD-10-CM | POA: Diagnosis not present

## 2022-10-06 DIAGNOSIS — Z8616 Personal history of COVID-19: Secondary | ICD-10-CM | POA: Diagnosis not present

## 2022-10-06 DIAGNOSIS — I10 Essential (primary) hypertension: Secondary | ICD-10-CM | POA: Diagnosis not present

## 2022-10-06 DIAGNOSIS — R296 Repeated falls: Secondary | ICD-10-CM | POA: Diagnosis not present

## 2022-10-06 DIAGNOSIS — R32 Unspecified urinary incontinence: Secondary | ICD-10-CM | POA: Diagnosis not present

## 2022-10-06 DIAGNOSIS — F0390 Unspecified dementia without behavioral disturbance: Secondary | ICD-10-CM | POA: Diagnosis not present

## 2022-10-06 DIAGNOSIS — I69318 Other symptoms and signs involving cognitive functions following cerebral infarction: Secondary | ICD-10-CM | POA: Diagnosis not present

## 2022-10-07 DIAGNOSIS — F0390 Unspecified dementia without behavioral disturbance: Secondary | ICD-10-CM | POA: Diagnosis not present

## 2022-10-07 DIAGNOSIS — R32 Unspecified urinary incontinence: Secondary | ICD-10-CM | POA: Diagnosis not present

## 2022-10-07 DIAGNOSIS — R296 Repeated falls: Secondary | ICD-10-CM | POA: Diagnosis not present

## 2022-10-07 DIAGNOSIS — I10 Essential (primary) hypertension: Secondary | ICD-10-CM | POA: Diagnosis not present

## 2022-10-07 DIAGNOSIS — I69318 Other symptoms and signs involving cognitive functions following cerebral infarction: Secondary | ICD-10-CM | POA: Diagnosis not present

## 2022-10-07 DIAGNOSIS — Z8616 Personal history of COVID-19: Secondary | ICD-10-CM | POA: Diagnosis not present

## 2022-10-10 DIAGNOSIS — R32 Unspecified urinary incontinence: Secondary | ICD-10-CM | POA: Diagnosis not present

## 2022-10-10 DIAGNOSIS — Z8616 Personal history of COVID-19: Secondary | ICD-10-CM | POA: Diagnosis not present

## 2022-10-10 DIAGNOSIS — I10 Essential (primary) hypertension: Secondary | ICD-10-CM | POA: Diagnosis not present

## 2022-10-10 DIAGNOSIS — R296 Repeated falls: Secondary | ICD-10-CM | POA: Diagnosis not present

## 2022-10-10 DIAGNOSIS — I69318 Other symptoms and signs involving cognitive functions following cerebral infarction: Secondary | ICD-10-CM | POA: Diagnosis not present

## 2022-10-10 DIAGNOSIS — F0390 Unspecified dementia without behavioral disturbance: Secondary | ICD-10-CM | POA: Diagnosis not present

## 2022-10-13 DIAGNOSIS — M24521 Contracture, right elbow: Secondary | ICD-10-CM | POA: Diagnosis not present

## 2022-10-13 DIAGNOSIS — R32 Unspecified urinary incontinence: Secondary | ICD-10-CM | POA: Diagnosis not present

## 2022-10-13 DIAGNOSIS — F0390 Unspecified dementia without behavioral disturbance: Secondary | ICD-10-CM | POA: Diagnosis not present

## 2022-10-13 DIAGNOSIS — R21 Rash and other nonspecific skin eruption: Secondary | ICD-10-CM | POA: Diagnosis not present

## 2022-10-13 DIAGNOSIS — G252 Other specified forms of tremor: Secondary | ICD-10-CM | POA: Diagnosis not present

## 2022-10-13 DIAGNOSIS — I10 Essential (primary) hypertension: Secondary | ICD-10-CM | POA: Diagnosis not present

## 2022-10-13 DIAGNOSIS — Z8616 Personal history of COVID-19: Secondary | ICD-10-CM | POA: Diagnosis not present

## 2022-10-13 DIAGNOSIS — Z7401 Bed confinement status: Secondary | ICD-10-CM | POA: Diagnosis not present

## 2022-10-13 DIAGNOSIS — I69318 Other symptoms and signs involving cognitive functions following cerebral infarction: Secondary | ICD-10-CM | POA: Diagnosis not present

## 2022-10-13 DIAGNOSIS — R296 Repeated falls: Secondary | ICD-10-CM | POA: Diagnosis not present

## 2022-10-13 DIAGNOSIS — H04129 Dry eye syndrome of unspecified lacrimal gland: Secondary | ICD-10-CM | POA: Diagnosis not present

## 2022-10-13 DIAGNOSIS — I69320 Aphasia following cerebral infarction: Secondary | ICD-10-CM | POA: Diagnosis not present

## 2022-10-13 DIAGNOSIS — H3530 Unspecified macular degeneration: Secondary | ICD-10-CM | POA: Diagnosis not present

## 2022-10-13 DIAGNOSIS — Z741 Need for assistance with personal care: Secondary | ICD-10-CM | POA: Diagnosis not present

## 2022-10-13 DIAGNOSIS — Z6823 Body mass index (BMI) 23.0-23.9, adult: Secondary | ICD-10-CM | POA: Diagnosis not present

## 2022-10-13 DIAGNOSIS — L853 Xerosis cutis: Secondary | ICD-10-CM | POA: Diagnosis not present

## 2022-10-13 DIAGNOSIS — R159 Full incontinence of feces: Secondary | ICD-10-CM | POA: Diagnosis not present

## 2022-10-14 DIAGNOSIS — I69320 Aphasia following cerebral infarction: Secondary | ICD-10-CM | POA: Diagnosis not present

## 2022-10-14 DIAGNOSIS — I10 Essential (primary) hypertension: Secondary | ICD-10-CM | POA: Diagnosis not present

## 2022-10-14 DIAGNOSIS — F0390 Unspecified dementia without behavioral disturbance: Secondary | ICD-10-CM | POA: Diagnosis not present

## 2022-10-14 DIAGNOSIS — R296 Repeated falls: Secondary | ICD-10-CM | POA: Diagnosis not present

## 2022-10-14 DIAGNOSIS — I69318 Other symptoms and signs involving cognitive functions following cerebral infarction: Secondary | ICD-10-CM | POA: Diagnosis not present

## 2022-10-14 DIAGNOSIS — Z8616 Personal history of COVID-19: Secondary | ICD-10-CM | POA: Diagnosis not present

## 2022-10-17 DIAGNOSIS — Z8616 Personal history of COVID-19: Secondary | ICD-10-CM | POA: Diagnosis not present

## 2022-10-17 DIAGNOSIS — I10 Essential (primary) hypertension: Secondary | ICD-10-CM | POA: Diagnosis not present

## 2022-10-17 DIAGNOSIS — I69318 Other symptoms and signs involving cognitive functions following cerebral infarction: Secondary | ICD-10-CM | POA: Diagnosis not present

## 2022-10-17 DIAGNOSIS — F0390 Unspecified dementia without behavioral disturbance: Secondary | ICD-10-CM | POA: Diagnosis not present

## 2022-10-17 DIAGNOSIS — R296 Repeated falls: Secondary | ICD-10-CM | POA: Diagnosis not present

## 2022-10-17 DIAGNOSIS — I69320 Aphasia following cerebral infarction: Secondary | ICD-10-CM | POA: Diagnosis not present

## 2022-10-20 DIAGNOSIS — R296 Repeated falls: Secondary | ICD-10-CM | POA: Diagnosis not present

## 2022-10-20 DIAGNOSIS — I10 Essential (primary) hypertension: Secondary | ICD-10-CM | POA: Diagnosis not present

## 2022-10-20 DIAGNOSIS — I69318 Other symptoms and signs involving cognitive functions following cerebral infarction: Secondary | ICD-10-CM | POA: Diagnosis not present

## 2022-10-20 DIAGNOSIS — F0390 Unspecified dementia without behavioral disturbance: Secondary | ICD-10-CM | POA: Diagnosis not present

## 2022-10-20 DIAGNOSIS — Z8616 Personal history of COVID-19: Secondary | ICD-10-CM | POA: Diagnosis not present

## 2022-10-20 DIAGNOSIS — I69320 Aphasia following cerebral infarction: Secondary | ICD-10-CM | POA: Diagnosis not present

## 2022-10-21 DIAGNOSIS — F0390 Unspecified dementia without behavioral disturbance: Secondary | ICD-10-CM | POA: Diagnosis not present

## 2022-10-21 DIAGNOSIS — R296 Repeated falls: Secondary | ICD-10-CM | POA: Diagnosis not present

## 2022-10-21 DIAGNOSIS — I10 Essential (primary) hypertension: Secondary | ICD-10-CM | POA: Diagnosis not present

## 2022-10-21 DIAGNOSIS — I69318 Other symptoms and signs involving cognitive functions following cerebral infarction: Secondary | ICD-10-CM | POA: Diagnosis not present

## 2022-10-21 DIAGNOSIS — I69320 Aphasia following cerebral infarction: Secondary | ICD-10-CM | POA: Diagnosis not present

## 2022-10-21 DIAGNOSIS — Z8616 Personal history of COVID-19: Secondary | ICD-10-CM | POA: Diagnosis not present

## 2022-10-24 DIAGNOSIS — I10 Essential (primary) hypertension: Secondary | ICD-10-CM | POA: Diagnosis not present

## 2022-10-24 DIAGNOSIS — I69318 Other symptoms and signs involving cognitive functions following cerebral infarction: Secondary | ICD-10-CM | POA: Diagnosis not present

## 2022-10-24 DIAGNOSIS — Z8616 Personal history of COVID-19: Secondary | ICD-10-CM | POA: Diagnosis not present

## 2022-10-24 DIAGNOSIS — R296 Repeated falls: Secondary | ICD-10-CM | POA: Diagnosis not present

## 2022-10-24 DIAGNOSIS — F0390 Unspecified dementia without behavioral disturbance: Secondary | ICD-10-CM | POA: Diagnosis not present

## 2022-10-24 DIAGNOSIS — I69320 Aphasia following cerebral infarction: Secondary | ICD-10-CM | POA: Diagnosis not present

## 2022-10-27 DIAGNOSIS — Z8616 Personal history of COVID-19: Secondary | ICD-10-CM | POA: Diagnosis not present

## 2022-10-27 DIAGNOSIS — I69320 Aphasia following cerebral infarction: Secondary | ICD-10-CM | POA: Diagnosis not present

## 2022-10-27 DIAGNOSIS — I10 Essential (primary) hypertension: Secondary | ICD-10-CM | POA: Diagnosis not present

## 2022-10-27 DIAGNOSIS — R296 Repeated falls: Secondary | ICD-10-CM | POA: Diagnosis not present

## 2022-10-27 DIAGNOSIS — F0390 Unspecified dementia without behavioral disturbance: Secondary | ICD-10-CM | POA: Diagnosis not present

## 2022-10-27 DIAGNOSIS — I69318 Other symptoms and signs involving cognitive functions following cerebral infarction: Secondary | ICD-10-CM | POA: Diagnosis not present

## 2022-10-28 DIAGNOSIS — F0390 Unspecified dementia without behavioral disturbance: Secondary | ICD-10-CM | POA: Diagnosis not present

## 2022-10-28 DIAGNOSIS — I69320 Aphasia following cerebral infarction: Secondary | ICD-10-CM | POA: Diagnosis not present

## 2022-10-28 DIAGNOSIS — I69318 Other symptoms and signs involving cognitive functions following cerebral infarction: Secondary | ICD-10-CM | POA: Diagnosis not present

## 2022-10-28 DIAGNOSIS — R296 Repeated falls: Secondary | ICD-10-CM | POA: Diagnosis not present

## 2022-10-28 DIAGNOSIS — I10 Essential (primary) hypertension: Secondary | ICD-10-CM | POA: Diagnosis not present

## 2022-10-28 DIAGNOSIS — Z8616 Personal history of COVID-19: Secondary | ICD-10-CM | POA: Diagnosis not present

## 2022-10-31 DIAGNOSIS — Z8616 Personal history of COVID-19: Secondary | ICD-10-CM | POA: Diagnosis not present

## 2022-10-31 DIAGNOSIS — F0390 Unspecified dementia without behavioral disturbance: Secondary | ICD-10-CM | POA: Diagnosis not present

## 2022-10-31 DIAGNOSIS — I69318 Other symptoms and signs involving cognitive functions following cerebral infarction: Secondary | ICD-10-CM | POA: Diagnosis not present

## 2022-10-31 DIAGNOSIS — I10 Essential (primary) hypertension: Secondary | ICD-10-CM | POA: Diagnosis not present

## 2022-10-31 DIAGNOSIS — R296 Repeated falls: Secondary | ICD-10-CM | POA: Diagnosis not present

## 2022-10-31 DIAGNOSIS — I69320 Aphasia following cerebral infarction: Secondary | ICD-10-CM | POA: Diagnosis not present

## 2022-11-03 DIAGNOSIS — I10 Essential (primary) hypertension: Secondary | ICD-10-CM | POA: Diagnosis not present

## 2022-11-03 DIAGNOSIS — R296 Repeated falls: Secondary | ICD-10-CM | POA: Diagnosis not present

## 2022-11-03 DIAGNOSIS — I69320 Aphasia following cerebral infarction: Secondary | ICD-10-CM | POA: Diagnosis not present

## 2022-11-03 DIAGNOSIS — F0390 Unspecified dementia without behavioral disturbance: Secondary | ICD-10-CM | POA: Diagnosis not present

## 2022-11-03 DIAGNOSIS — Z8616 Personal history of COVID-19: Secondary | ICD-10-CM | POA: Diagnosis not present

## 2022-11-03 DIAGNOSIS — I69318 Other symptoms and signs involving cognitive functions following cerebral infarction: Secondary | ICD-10-CM | POA: Diagnosis not present

## 2022-11-04 DIAGNOSIS — I69320 Aphasia following cerebral infarction: Secondary | ICD-10-CM | POA: Diagnosis not present

## 2022-11-04 DIAGNOSIS — F0390 Unspecified dementia without behavioral disturbance: Secondary | ICD-10-CM | POA: Diagnosis not present

## 2022-11-04 DIAGNOSIS — Z8616 Personal history of COVID-19: Secondary | ICD-10-CM | POA: Diagnosis not present

## 2022-11-04 DIAGNOSIS — I10 Essential (primary) hypertension: Secondary | ICD-10-CM | POA: Diagnosis not present

## 2022-11-04 DIAGNOSIS — I69318 Other symptoms and signs involving cognitive functions following cerebral infarction: Secondary | ICD-10-CM | POA: Diagnosis not present

## 2022-11-04 DIAGNOSIS — R296 Repeated falls: Secondary | ICD-10-CM | POA: Diagnosis not present

## 2022-11-07 DIAGNOSIS — F0390 Unspecified dementia without behavioral disturbance: Secondary | ICD-10-CM | POA: Diagnosis not present

## 2022-11-07 DIAGNOSIS — I69320 Aphasia following cerebral infarction: Secondary | ICD-10-CM | POA: Diagnosis not present

## 2022-11-07 DIAGNOSIS — I10 Essential (primary) hypertension: Secondary | ICD-10-CM | POA: Diagnosis not present

## 2022-11-07 DIAGNOSIS — R296 Repeated falls: Secondary | ICD-10-CM | POA: Diagnosis not present

## 2022-11-07 DIAGNOSIS — I69318 Other symptoms and signs involving cognitive functions following cerebral infarction: Secondary | ICD-10-CM | POA: Diagnosis not present

## 2022-11-07 DIAGNOSIS — Z8616 Personal history of COVID-19: Secondary | ICD-10-CM | POA: Diagnosis not present

## 2022-11-11 DIAGNOSIS — Z8616 Personal history of COVID-19: Secondary | ICD-10-CM | POA: Diagnosis not present

## 2022-11-11 DIAGNOSIS — I69320 Aphasia following cerebral infarction: Secondary | ICD-10-CM | POA: Diagnosis not present

## 2022-11-11 DIAGNOSIS — F0390 Unspecified dementia without behavioral disturbance: Secondary | ICD-10-CM | POA: Diagnosis not present

## 2022-11-11 DIAGNOSIS — I69318 Other symptoms and signs involving cognitive functions following cerebral infarction: Secondary | ICD-10-CM | POA: Diagnosis not present

## 2022-11-11 DIAGNOSIS — I10 Essential (primary) hypertension: Secondary | ICD-10-CM | POA: Diagnosis not present

## 2022-11-11 DIAGNOSIS — R296 Repeated falls: Secondary | ICD-10-CM | POA: Diagnosis not present

## 2022-11-13 DIAGNOSIS — Z7401 Bed confinement status: Secondary | ICD-10-CM | POA: Diagnosis not present

## 2022-11-13 DIAGNOSIS — G252 Other specified forms of tremor: Secondary | ICD-10-CM | POA: Diagnosis not present

## 2022-11-13 DIAGNOSIS — I69320 Aphasia following cerebral infarction: Secondary | ICD-10-CM | POA: Diagnosis not present

## 2022-11-13 DIAGNOSIS — M24521 Contracture, right elbow: Secondary | ICD-10-CM | POA: Diagnosis not present

## 2022-11-13 DIAGNOSIS — R21 Rash and other nonspecific skin eruption: Secondary | ICD-10-CM | POA: Diagnosis not present

## 2022-11-13 DIAGNOSIS — L853 Xerosis cutis: Secondary | ICD-10-CM | POA: Diagnosis not present

## 2022-11-13 DIAGNOSIS — R296 Repeated falls: Secondary | ICD-10-CM | POA: Diagnosis not present

## 2022-11-13 DIAGNOSIS — Z8616 Personal history of COVID-19: Secondary | ICD-10-CM | POA: Diagnosis not present

## 2022-11-13 DIAGNOSIS — I69318 Other symptoms and signs involving cognitive functions following cerebral infarction: Secondary | ICD-10-CM | POA: Diagnosis not present

## 2022-11-13 DIAGNOSIS — I10 Essential (primary) hypertension: Secondary | ICD-10-CM | POA: Diagnosis not present

## 2022-11-13 DIAGNOSIS — Z741 Need for assistance with personal care: Secondary | ICD-10-CM | POA: Diagnosis not present

## 2022-11-13 DIAGNOSIS — R32 Unspecified urinary incontinence: Secondary | ICD-10-CM | POA: Diagnosis not present

## 2022-11-13 DIAGNOSIS — R159 Full incontinence of feces: Secondary | ICD-10-CM | POA: Diagnosis not present

## 2022-11-13 DIAGNOSIS — H04129 Dry eye syndrome of unspecified lacrimal gland: Secondary | ICD-10-CM | POA: Diagnosis not present

## 2022-11-13 DIAGNOSIS — Z6823 Body mass index (BMI) 23.0-23.9, adult: Secondary | ICD-10-CM | POA: Diagnosis not present

## 2022-11-13 DIAGNOSIS — H3530 Unspecified macular degeneration: Secondary | ICD-10-CM | POA: Diagnosis not present

## 2022-11-13 DIAGNOSIS — F0390 Unspecified dementia without behavioral disturbance: Secondary | ICD-10-CM | POA: Diagnosis not present

## 2022-11-18 DIAGNOSIS — R296 Repeated falls: Secondary | ICD-10-CM | POA: Diagnosis not present

## 2022-11-18 DIAGNOSIS — I69318 Other symptoms and signs involving cognitive functions following cerebral infarction: Secondary | ICD-10-CM | POA: Diagnosis not present

## 2022-11-18 DIAGNOSIS — I10 Essential (primary) hypertension: Secondary | ICD-10-CM | POA: Diagnosis not present

## 2022-11-18 DIAGNOSIS — I69320 Aphasia following cerebral infarction: Secondary | ICD-10-CM | POA: Diagnosis not present

## 2022-11-18 DIAGNOSIS — F0390 Unspecified dementia without behavioral disturbance: Secondary | ICD-10-CM | POA: Diagnosis not present

## 2022-11-18 DIAGNOSIS — Z8616 Personal history of COVID-19: Secondary | ICD-10-CM | POA: Diagnosis not present

## 2022-11-20 DIAGNOSIS — R296 Repeated falls: Secondary | ICD-10-CM | POA: Diagnosis not present

## 2022-11-20 DIAGNOSIS — I69320 Aphasia following cerebral infarction: Secondary | ICD-10-CM | POA: Diagnosis not present

## 2022-11-20 DIAGNOSIS — F0390 Unspecified dementia without behavioral disturbance: Secondary | ICD-10-CM | POA: Diagnosis not present

## 2022-11-20 DIAGNOSIS — I10 Essential (primary) hypertension: Secondary | ICD-10-CM | POA: Diagnosis not present

## 2022-11-20 DIAGNOSIS — Z8616 Personal history of COVID-19: Secondary | ICD-10-CM | POA: Diagnosis not present

## 2022-11-20 DIAGNOSIS — I69318 Other symptoms and signs involving cognitive functions following cerebral infarction: Secondary | ICD-10-CM | POA: Diagnosis not present

## 2022-11-25 DIAGNOSIS — I10 Essential (primary) hypertension: Secondary | ICD-10-CM | POA: Diagnosis not present

## 2022-11-25 DIAGNOSIS — I69320 Aphasia following cerebral infarction: Secondary | ICD-10-CM | POA: Diagnosis not present

## 2022-11-25 DIAGNOSIS — R296 Repeated falls: Secondary | ICD-10-CM | POA: Diagnosis not present

## 2022-11-25 DIAGNOSIS — I69318 Other symptoms and signs involving cognitive functions following cerebral infarction: Secondary | ICD-10-CM | POA: Diagnosis not present

## 2022-11-25 DIAGNOSIS — F0390 Unspecified dementia without behavioral disturbance: Secondary | ICD-10-CM | POA: Diagnosis not present

## 2022-11-25 DIAGNOSIS — Z8616 Personal history of COVID-19: Secondary | ICD-10-CM | POA: Diagnosis not present

## 2022-11-27 DIAGNOSIS — F0390 Unspecified dementia without behavioral disturbance: Secondary | ICD-10-CM | POA: Diagnosis not present

## 2022-11-27 DIAGNOSIS — I69320 Aphasia following cerebral infarction: Secondary | ICD-10-CM | POA: Diagnosis not present

## 2022-11-27 DIAGNOSIS — I69318 Other symptoms and signs involving cognitive functions following cerebral infarction: Secondary | ICD-10-CM | POA: Diagnosis not present

## 2022-11-27 DIAGNOSIS — R296 Repeated falls: Secondary | ICD-10-CM | POA: Diagnosis not present

## 2022-11-27 DIAGNOSIS — I10 Essential (primary) hypertension: Secondary | ICD-10-CM | POA: Diagnosis not present

## 2022-11-27 DIAGNOSIS — Z8616 Personal history of COVID-19: Secondary | ICD-10-CM | POA: Diagnosis not present

## 2022-12-02 DIAGNOSIS — I10 Essential (primary) hypertension: Secondary | ICD-10-CM | POA: Diagnosis not present

## 2022-12-02 DIAGNOSIS — I69320 Aphasia following cerebral infarction: Secondary | ICD-10-CM | POA: Diagnosis not present

## 2022-12-02 DIAGNOSIS — F0390 Unspecified dementia without behavioral disturbance: Secondary | ICD-10-CM | POA: Diagnosis not present

## 2022-12-02 DIAGNOSIS — R296 Repeated falls: Secondary | ICD-10-CM | POA: Diagnosis not present

## 2022-12-02 DIAGNOSIS — I69318 Other symptoms and signs involving cognitive functions following cerebral infarction: Secondary | ICD-10-CM | POA: Diagnosis not present

## 2022-12-02 DIAGNOSIS — Z8616 Personal history of COVID-19: Secondary | ICD-10-CM | POA: Diagnosis not present

## 2022-12-04 DIAGNOSIS — F0390 Unspecified dementia without behavioral disturbance: Secondary | ICD-10-CM | POA: Diagnosis not present

## 2022-12-04 DIAGNOSIS — Z8616 Personal history of COVID-19: Secondary | ICD-10-CM | POA: Diagnosis not present

## 2022-12-04 DIAGNOSIS — I69318 Other symptoms and signs involving cognitive functions following cerebral infarction: Secondary | ICD-10-CM | POA: Diagnosis not present

## 2022-12-04 DIAGNOSIS — R296 Repeated falls: Secondary | ICD-10-CM | POA: Diagnosis not present

## 2022-12-04 DIAGNOSIS — I10 Essential (primary) hypertension: Secondary | ICD-10-CM | POA: Diagnosis not present

## 2022-12-04 DIAGNOSIS — I69320 Aphasia following cerebral infarction: Secondary | ICD-10-CM | POA: Diagnosis not present

## 2022-12-09 DIAGNOSIS — I69320 Aphasia following cerebral infarction: Secondary | ICD-10-CM | POA: Diagnosis not present

## 2022-12-09 DIAGNOSIS — I10 Essential (primary) hypertension: Secondary | ICD-10-CM | POA: Diagnosis not present

## 2022-12-09 DIAGNOSIS — I69318 Other symptoms and signs involving cognitive functions following cerebral infarction: Secondary | ICD-10-CM | POA: Diagnosis not present

## 2022-12-09 DIAGNOSIS — R296 Repeated falls: Secondary | ICD-10-CM | POA: Diagnosis not present

## 2022-12-09 DIAGNOSIS — Z8616 Personal history of COVID-19: Secondary | ICD-10-CM | POA: Diagnosis not present

## 2022-12-09 DIAGNOSIS — F0390 Unspecified dementia without behavioral disturbance: Secondary | ICD-10-CM | POA: Diagnosis not present

## 2022-12-11 DIAGNOSIS — I69318 Other symptoms and signs involving cognitive functions following cerebral infarction: Secondary | ICD-10-CM | POA: Diagnosis not present

## 2022-12-11 DIAGNOSIS — F0390 Unspecified dementia without behavioral disturbance: Secondary | ICD-10-CM | POA: Diagnosis not present

## 2022-12-11 DIAGNOSIS — I10 Essential (primary) hypertension: Secondary | ICD-10-CM | POA: Diagnosis not present

## 2022-12-11 DIAGNOSIS — I69320 Aphasia following cerebral infarction: Secondary | ICD-10-CM | POA: Diagnosis not present

## 2022-12-11 DIAGNOSIS — Z8616 Personal history of COVID-19: Secondary | ICD-10-CM | POA: Diagnosis not present

## 2022-12-11 DIAGNOSIS — R296 Repeated falls: Secondary | ICD-10-CM | POA: Diagnosis not present

## 2022-12-14 DIAGNOSIS — Z8616 Personal history of COVID-19: Secondary | ICD-10-CM | POA: Diagnosis not present

## 2022-12-14 DIAGNOSIS — G252 Other specified forms of tremor: Secondary | ICD-10-CM | POA: Diagnosis not present

## 2022-12-14 DIAGNOSIS — F0390 Unspecified dementia without behavioral disturbance: Secondary | ICD-10-CM | POA: Diagnosis not present

## 2022-12-14 DIAGNOSIS — I10 Essential (primary) hypertension: Secondary | ICD-10-CM | POA: Diagnosis not present

## 2022-12-14 DIAGNOSIS — R32 Unspecified urinary incontinence: Secondary | ICD-10-CM | POA: Diagnosis not present

## 2022-12-14 DIAGNOSIS — I69320 Aphasia following cerebral infarction: Secondary | ICD-10-CM | POA: Diagnosis not present

## 2022-12-14 DIAGNOSIS — R159 Full incontinence of feces: Secondary | ICD-10-CM | POA: Diagnosis not present

## 2022-12-14 DIAGNOSIS — R21 Rash and other nonspecific skin eruption: Secondary | ICD-10-CM | POA: Diagnosis not present

## 2022-12-14 DIAGNOSIS — Z741 Need for assistance with personal care: Secondary | ICD-10-CM | POA: Diagnosis not present

## 2022-12-14 DIAGNOSIS — I69318 Other symptoms and signs involving cognitive functions following cerebral infarction: Secondary | ICD-10-CM | POA: Diagnosis not present

## 2022-12-14 DIAGNOSIS — M24521 Contracture, right elbow: Secondary | ICD-10-CM | POA: Diagnosis not present

## 2022-12-14 DIAGNOSIS — R296 Repeated falls: Secondary | ICD-10-CM | POA: Diagnosis not present

## 2022-12-15 DIAGNOSIS — I10 Essential (primary) hypertension: Secondary | ICD-10-CM | POA: Diagnosis not present

## 2022-12-15 DIAGNOSIS — I69318 Other symptoms and signs involving cognitive functions following cerebral infarction: Secondary | ICD-10-CM | POA: Diagnosis not present

## 2022-12-15 DIAGNOSIS — F0390 Unspecified dementia without behavioral disturbance: Secondary | ICD-10-CM | POA: Diagnosis not present

## 2022-12-15 DIAGNOSIS — Z8616 Personal history of COVID-19: Secondary | ICD-10-CM | POA: Diagnosis not present

## 2022-12-15 DIAGNOSIS — I69320 Aphasia following cerebral infarction: Secondary | ICD-10-CM | POA: Diagnosis not present

## 2022-12-15 DIAGNOSIS — R296 Repeated falls: Secondary | ICD-10-CM | POA: Diagnosis not present

## 2022-12-16 DIAGNOSIS — I69318 Other symptoms and signs involving cognitive functions following cerebral infarction: Secondary | ICD-10-CM | POA: Diagnosis not present

## 2022-12-16 DIAGNOSIS — R296 Repeated falls: Secondary | ICD-10-CM | POA: Diagnosis not present

## 2022-12-16 DIAGNOSIS — Z8616 Personal history of COVID-19: Secondary | ICD-10-CM | POA: Diagnosis not present

## 2022-12-16 DIAGNOSIS — I10 Essential (primary) hypertension: Secondary | ICD-10-CM | POA: Diagnosis not present

## 2022-12-16 DIAGNOSIS — I69320 Aphasia following cerebral infarction: Secondary | ICD-10-CM | POA: Diagnosis not present

## 2022-12-16 DIAGNOSIS — F0390 Unspecified dementia without behavioral disturbance: Secondary | ICD-10-CM | POA: Diagnosis not present

## 2022-12-17 DIAGNOSIS — F0390 Unspecified dementia without behavioral disturbance: Secondary | ICD-10-CM | POA: Diagnosis not present

## 2022-12-17 DIAGNOSIS — R296 Repeated falls: Secondary | ICD-10-CM | POA: Diagnosis not present

## 2022-12-17 DIAGNOSIS — I69320 Aphasia following cerebral infarction: Secondary | ICD-10-CM | POA: Diagnosis not present

## 2022-12-17 DIAGNOSIS — I10 Essential (primary) hypertension: Secondary | ICD-10-CM | POA: Diagnosis not present

## 2022-12-17 DIAGNOSIS — Z8616 Personal history of COVID-19: Secondary | ICD-10-CM | POA: Diagnosis not present

## 2022-12-17 DIAGNOSIS — I69318 Other symptoms and signs involving cognitive functions following cerebral infarction: Secondary | ICD-10-CM | POA: Diagnosis not present

## 2022-12-18 DIAGNOSIS — I10 Essential (primary) hypertension: Secondary | ICD-10-CM | POA: Diagnosis not present

## 2022-12-18 DIAGNOSIS — Z8616 Personal history of COVID-19: Secondary | ICD-10-CM | POA: Diagnosis not present

## 2022-12-18 DIAGNOSIS — I69318 Other symptoms and signs involving cognitive functions following cerebral infarction: Secondary | ICD-10-CM | POA: Diagnosis not present

## 2022-12-18 DIAGNOSIS — I69320 Aphasia following cerebral infarction: Secondary | ICD-10-CM | POA: Diagnosis not present

## 2022-12-18 DIAGNOSIS — I739 Peripheral vascular disease, unspecified: Secondary | ICD-10-CM | POA: Diagnosis not present

## 2022-12-18 DIAGNOSIS — L84 Corns and callosities: Secondary | ICD-10-CM | POA: Diagnosis not present

## 2022-12-18 DIAGNOSIS — M79675 Pain in left toe(s): Secondary | ICD-10-CM | POA: Diagnosis not present

## 2022-12-18 DIAGNOSIS — B351 Tinea unguium: Secondary | ICD-10-CM | POA: Diagnosis not present

## 2022-12-18 DIAGNOSIS — M2041 Other hammer toe(s) (acquired), right foot: Secondary | ICD-10-CM | POA: Diagnosis not present

## 2022-12-18 DIAGNOSIS — R296 Repeated falls: Secondary | ICD-10-CM | POA: Diagnosis not present

## 2022-12-18 DIAGNOSIS — F0390 Unspecified dementia without behavioral disturbance: Secondary | ICD-10-CM | POA: Diagnosis not present

## 2022-12-19 DIAGNOSIS — F0390 Unspecified dementia without behavioral disturbance: Secondary | ICD-10-CM | POA: Diagnosis not present

## 2022-12-19 DIAGNOSIS — I69320 Aphasia following cerebral infarction: Secondary | ICD-10-CM | POA: Diagnosis not present

## 2022-12-19 DIAGNOSIS — I10 Essential (primary) hypertension: Secondary | ICD-10-CM | POA: Diagnosis not present

## 2022-12-19 DIAGNOSIS — Z8616 Personal history of COVID-19: Secondary | ICD-10-CM | POA: Diagnosis not present

## 2022-12-19 DIAGNOSIS — R296 Repeated falls: Secondary | ICD-10-CM | POA: Diagnosis not present

## 2022-12-19 DIAGNOSIS — I69318 Other symptoms and signs involving cognitive functions following cerebral infarction: Secondary | ICD-10-CM | POA: Diagnosis not present

## 2022-12-20 DIAGNOSIS — F0390 Unspecified dementia without behavioral disturbance: Secondary | ICD-10-CM | POA: Diagnosis not present

## 2022-12-20 DIAGNOSIS — Z8616 Personal history of COVID-19: Secondary | ICD-10-CM | POA: Diagnosis not present

## 2022-12-20 DIAGNOSIS — I69320 Aphasia following cerebral infarction: Secondary | ICD-10-CM | POA: Diagnosis not present

## 2022-12-20 DIAGNOSIS — I69318 Other symptoms and signs involving cognitive functions following cerebral infarction: Secondary | ICD-10-CM | POA: Diagnosis not present

## 2022-12-20 DIAGNOSIS — R296 Repeated falls: Secondary | ICD-10-CM | POA: Diagnosis not present

## 2022-12-20 DIAGNOSIS — I10 Essential (primary) hypertension: Secondary | ICD-10-CM | POA: Diagnosis not present

## 2022-12-21 DIAGNOSIS — I69320 Aphasia following cerebral infarction: Secondary | ICD-10-CM | POA: Diagnosis not present

## 2022-12-21 DIAGNOSIS — F0390 Unspecified dementia without behavioral disturbance: Secondary | ICD-10-CM | POA: Diagnosis not present

## 2022-12-21 DIAGNOSIS — I69318 Other symptoms and signs involving cognitive functions following cerebral infarction: Secondary | ICD-10-CM | POA: Diagnosis not present

## 2022-12-21 DIAGNOSIS — I10 Essential (primary) hypertension: Secondary | ICD-10-CM | POA: Diagnosis not present

## 2022-12-21 DIAGNOSIS — R296 Repeated falls: Secondary | ICD-10-CM | POA: Diagnosis not present

## 2022-12-21 DIAGNOSIS — Z8616 Personal history of COVID-19: Secondary | ICD-10-CM | POA: Diagnosis not present

## 2022-12-22 DIAGNOSIS — I10 Essential (primary) hypertension: Secondary | ICD-10-CM | POA: Diagnosis not present

## 2022-12-22 DIAGNOSIS — Z8616 Personal history of COVID-19: Secondary | ICD-10-CM | POA: Diagnosis not present

## 2022-12-22 DIAGNOSIS — R296 Repeated falls: Secondary | ICD-10-CM | POA: Diagnosis not present

## 2022-12-22 DIAGNOSIS — I69318 Other symptoms and signs involving cognitive functions following cerebral infarction: Secondary | ICD-10-CM | POA: Diagnosis not present

## 2022-12-22 DIAGNOSIS — I69320 Aphasia following cerebral infarction: Secondary | ICD-10-CM | POA: Diagnosis not present

## 2022-12-22 DIAGNOSIS — F0390 Unspecified dementia without behavioral disturbance: Secondary | ICD-10-CM | POA: Diagnosis not present

## 2022-12-23 DIAGNOSIS — I10 Essential (primary) hypertension: Secondary | ICD-10-CM | POA: Diagnosis not present

## 2022-12-23 DIAGNOSIS — Z8616 Personal history of COVID-19: Secondary | ICD-10-CM | POA: Diagnosis not present

## 2022-12-23 DIAGNOSIS — R296 Repeated falls: Secondary | ICD-10-CM | POA: Diagnosis not present

## 2022-12-23 DIAGNOSIS — I69318 Other symptoms and signs involving cognitive functions following cerebral infarction: Secondary | ICD-10-CM | POA: Diagnosis not present

## 2022-12-23 DIAGNOSIS — I69320 Aphasia following cerebral infarction: Secondary | ICD-10-CM | POA: Diagnosis not present

## 2022-12-23 DIAGNOSIS — F0390 Unspecified dementia without behavioral disturbance: Secondary | ICD-10-CM | POA: Diagnosis not present

## 2022-12-24 DIAGNOSIS — I10 Essential (primary) hypertension: Secondary | ICD-10-CM | POA: Diagnosis not present

## 2022-12-24 DIAGNOSIS — F0390 Unspecified dementia without behavioral disturbance: Secondary | ICD-10-CM | POA: Diagnosis not present

## 2022-12-24 DIAGNOSIS — I69320 Aphasia following cerebral infarction: Secondary | ICD-10-CM | POA: Diagnosis not present

## 2022-12-24 DIAGNOSIS — R296 Repeated falls: Secondary | ICD-10-CM | POA: Diagnosis not present

## 2022-12-24 DIAGNOSIS — Z8616 Personal history of COVID-19: Secondary | ICD-10-CM | POA: Diagnosis not present

## 2022-12-24 DIAGNOSIS — I69318 Other symptoms and signs involving cognitive functions following cerebral infarction: Secondary | ICD-10-CM | POA: Diagnosis not present

## 2022-12-25 DIAGNOSIS — Z8616 Personal history of COVID-19: Secondary | ICD-10-CM | POA: Diagnosis not present

## 2022-12-25 DIAGNOSIS — F0390 Unspecified dementia without behavioral disturbance: Secondary | ICD-10-CM | POA: Diagnosis not present

## 2022-12-25 DIAGNOSIS — R296 Repeated falls: Secondary | ICD-10-CM | POA: Diagnosis not present

## 2022-12-25 DIAGNOSIS — I69320 Aphasia following cerebral infarction: Secondary | ICD-10-CM | POA: Diagnosis not present

## 2022-12-25 DIAGNOSIS — I69318 Other symptoms and signs involving cognitive functions following cerebral infarction: Secondary | ICD-10-CM | POA: Diagnosis not present

## 2022-12-25 DIAGNOSIS — I10 Essential (primary) hypertension: Secondary | ICD-10-CM | POA: Diagnosis not present

## 2022-12-26 DIAGNOSIS — Z8616 Personal history of COVID-19: Secondary | ICD-10-CM | POA: Diagnosis not present

## 2022-12-26 DIAGNOSIS — I69318 Other symptoms and signs involving cognitive functions following cerebral infarction: Secondary | ICD-10-CM | POA: Diagnosis not present

## 2022-12-26 DIAGNOSIS — I10 Essential (primary) hypertension: Secondary | ICD-10-CM | POA: Diagnosis not present

## 2022-12-26 DIAGNOSIS — I69320 Aphasia following cerebral infarction: Secondary | ICD-10-CM | POA: Diagnosis not present

## 2022-12-26 DIAGNOSIS — R296 Repeated falls: Secondary | ICD-10-CM | POA: Diagnosis not present

## 2022-12-26 DIAGNOSIS — F0390 Unspecified dementia without behavioral disturbance: Secondary | ICD-10-CM | POA: Diagnosis not present

## 2022-12-27 DIAGNOSIS — I69318 Other symptoms and signs involving cognitive functions following cerebral infarction: Secondary | ICD-10-CM | POA: Diagnosis not present

## 2022-12-27 DIAGNOSIS — R296 Repeated falls: Secondary | ICD-10-CM | POA: Diagnosis not present

## 2022-12-27 DIAGNOSIS — F0390 Unspecified dementia without behavioral disturbance: Secondary | ICD-10-CM | POA: Diagnosis not present

## 2022-12-27 DIAGNOSIS — Z8616 Personal history of COVID-19: Secondary | ICD-10-CM | POA: Diagnosis not present

## 2022-12-27 DIAGNOSIS — I10 Essential (primary) hypertension: Secondary | ICD-10-CM | POA: Diagnosis not present

## 2022-12-27 DIAGNOSIS — I69320 Aphasia following cerebral infarction: Secondary | ICD-10-CM | POA: Diagnosis not present

## 2022-12-28 DIAGNOSIS — I69320 Aphasia following cerebral infarction: Secondary | ICD-10-CM | POA: Diagnosis not present

## 2022-12-28 DIAGNOSIS — I69318 Other symptoms and signs involving cognitive functions following cerebral infarction: Secondary | ICD-10-CM | POA: Diagnosis not present

## 2022-12-28 DIAGNOSIS — Z8616 Personal history of COVID-19: Secondary | ICD-10-CM | POA: Diagnosis not present

## 2022-12-28 DIAGNOSIS — I10 Essential (primary) hypertension: Secondary | ICD-10-CM | POA: Diagnosis not present

## 2022-12-28 DIAGNOSIS — F0390 Unspecified dementia without behavioral disturbance: Secondary | ICD-10-CM | POA: Diagnosis not present

## 2022-12-28 DIAGNOSIS — R296 Repeated falls: Secondary | ICD-10-CM | POA: Diagnosis not present

## 2022-12-29 DIAGNOSIS — Z8616 Personal history of COVID-19: Secondary | ICD-10-CM | POA: Diagnosis not present

## 2022-12-29 DIAGNOSIS — I69320 Aphasia following cerebral infarction: Secondary | ICD-10-CM | POA: Diagnosis not present

## 2022-12-29 DIAGNOSIS — F0390 Unspecified dementia without behavioral disturbance: Secondary | ICD-10-CM | POA: Diagnosis not present

## 2022-12-29 DIAGNOSIS — I10 Essential (primary) hypertension: Secondary | ICD-10-CM | POA: Diagnosis not present

## 2022-12-29 DIAGNOSIS — R296 Repeated falls: Secondary | ICD-10-CM | POA: Diagnosis not present

## 2022-12-29 DIAGNOSIS — I69318 Other symptoms and signs involving cognitive functions following cerebral infarction: Secondary | ICD-10-CM | POA: Diagnosis not present

## 2022-12-30 DIAGNOSIS — I10 Essential (primary) hypertension: Secondary | ICD-10-CM | POA: Diagnosis not present

## 2022-12-30 DIAGNOSIS — I69318 Other symptoms and signs involving cognitive functions following cerebral infarction: Secondary | ICD-10-CM | POA: Diagnosis not present

## 2022-12-30 DIAGNOSIS — I69320 Aphasia following cerebral infarction: Secondary | ICD-10-CM | POA: Diagnosis not present

## 2022-12-30 DIAGNOSIS — F0390 Unspecified dementia without behavioral disturbance: Secondary | ICD-10-CM | POA: Diagnosis not present

## 2022-12-30 DIAGNOSIS — Z8616 Personal history of COVID-19: Secondary | ICD-10-CM | POA: Diagnosis not present

## 2022-12-30 DIAGNOSIS — R296 Repeated falls: Secondary | ICD-10-CM | POA: Diagnosis not present

## 2022-12-31 DIAGNOSIS — I69320 Aphasia following cerebral infarction: Secondary | ICD-10-CM | POA: Diagnosis not present

## 2022-12-31 DIAGNOSIS — R296 Repeated falls: Secondary | ICD-10-CM | POA: Diagnosis not present

## 2022-12-31 DIAGNOSIS — I69318 Other symptoms and signs involving cognitive functions following cerebral infarction: Secondary | ICD-10-CM | POA: Diagnosis not present

## 2022-12-31 DIAGNOSIS — I10 Essential (primary) hypertension: Secondary | ICD-10-CM | POA: Diagnosis not present

## 2022-12-31 DIAGNOSIS — F0390 Unspecified dementia without behavioral disturbance: Secondary | ICD-10-CM | POA: Diagnosis not present

## 2022-12-31 DIAGNOSIS — Z8616 Personal history of COVID-19: Secondary | ICD-10-CM | POA: Diagnosis not present

## 2023-01-01 DIAGNOSIS — I10 Essential (primary) hypertension: Secondary | ICD-10-CM | POA: Diagnosis not present

## 2023-01-01 DIAGNOSIS — I69318 Other symptoms and signs involving cognitive functions following cerebral infarction: Secondary | ICD-10-CM | POA: Diagnosis not present

## 2023-01-01 DIAGNOSIS — I69320 Aphasia following cerebral infarction: Secondary | ICD-10-CM | POA: Diagnosis not present

## 2023-01-01 DIAGNOSIS — R296 Repeated falls: Secondary | ICD-10-CM | POA: Diagnosis not present

## 2023-01-01 DIAGNOSIS — F0390 Unspecified dementia without behavioral disturbance: Secondary | ICD-10-CM | POA: Diagnosis not present

## 2023-01-01 DIAGNOSIS — Z8616 Personal history of COVID-19: Secondary | ICD-10-CM | POA: Diagnosis not present

## 2023-01-02 DIAGNOSIS — I69318 Other symptoms and signs involving cognitive functions following cerebral infarction: Secondary | ICD-10-CM | POA: Diagnosis not present

## 2023-01-02 DIAGNOSIS — Z8616 Personal history of COVID-19: Secondary | ICD-10-CM | POA: Diagnosis not present

## 2023-01-02 DIAGNOSIS — R296 Repeated falls: Secondary | ICD-10-CM | POA: Diagnosis not present

## 2023-01-02 DIAGNOSIS — I10 Essential (primary) hypertension: Secondary | ICD-10-CM | POA: Diagnosis not present

## 2023-01-02 DIAGNOSIS — F0390 Unspecified dementia without behavioral disturbance: Secondary | ICD-10-CM | POA: Diagnosis not present

## 2023-01-02 DIAGNOSIS — I69320 Aphasia following cerebral infarction: Secondary | ICD-10-CM | POA: Diagnosis not present

## 2023-01-03 DIAGNOSIS — R296 Repeated falls: Secondary | ICD-10-CM | POA: Diagnosis not present

## 2023-01-03 DIAGNOSIS — I69318 Other symptoms and signs involving cognitive functions following cerebral infarction: Secondary | ICD-10-CM | POA: Diagnosis not present

## 2023-01-03 DIAGNOSIS — I69320 Aphasia following cerebral infarction: Secondary | ICD-10-CM | POA: Diagnosis not present

## 2023-01-03 DIAGNOSIS — Z8616 Personal history of COVID-19: Secondary | ICD-10-CM | POA: Diagnosis not present

## 2023-01-03 DIAGNOSIS — I10 Essential (primary) hypertension: Secondary | ICD-10-CM | POA: Diagnosis not present

## 2023-01-03 DIAGNOSIS — F0390 Unspecified dementia without behavioral disturbance: Secondary | ICD-10-CM | POA: Diagnosis not present

## 2023-01-04 DIAGNOSIS — F0390 Unspecified dementia without behavioral disturbance: Secondary | ICD-10-CM | POA: Diagnosis not present

## 2023-01-04 DIAGNOSIS — I10 Essential (primary) hypertension: Secondary | ICD-10-CM | POA: Diagnosis not present

## 2023-01-04 DIAGNOSIS — I69318 Other symptoms and signs involving cognitive functions following cerebral infarction: Secondary | ICD-10-CM | POA: Diagnosis not present

## 2023-01-04 DIAGNOSIS — Z8616 Personal history of COVID-19: Secondary | ICD-10-CM | POA: Diagnosis not present

## 2023-01-04 DIAGNOSIS — I69320 Aphasia following cerebral infarction: Secondary | ICD-10-CM | POA: Diagnosis not present

## 2023-01-04 DIAGNOSIS — R296 Repeated falls: Secondary | ICD-10-CM | POA: Diagnosis not present

## 2023-01-05 DIAGNOSIS — R296 Repeated falls: Secondary | ICD-10-CM | POA: Diagnosis not present

## 2023-01-05 DIAGNOSIS — I69318 Other symptoms and signs involving cognitive functions following cerebral infarction: Secondary | ICD-10-CM | POA: Diagnosis not present

## 2023-01-05 DIAGNOSIS — I10 Essential (primary) hypertension: Secondary | ICD-10-CM | POA: Diagnosis not present

## 2023-01-05 DIAGNOSIS — Z8616 Personal history of COVID-19: Secondary | ICD-10-CM | POA: Diagnosis not present

## 2023-01-05 DIAGNOSIS — I69320 Aphasia following cerebral infarction: Secondary | ICD-10-CM | POA: Diagnosis not present

## 2023-01-05 DIAGNOSIS — F0390 Unspecified dementia without behavioral disturbance: Secondary | ICD-10-CM | POA: Diagnosis not present

## 2023-01-06 DIAGNOSIS — I10 Essential (primary) hypertension: Secondary | ICD-10-CM | POA: Diagnosis not present

## 2023-01-06 DIAGNOSIS — F0390 Unspecified dementia without behavioral disturbance: Secondary | ICD-10-CM | POA: Diagnosis not present

## 2023-01-06 DIAGNOSIS — I69320 Aphasia following cerebral infarction: Secondary | ICD-10-CM | POA: Diagnosis not present

## 2023-01-06 DIAGNOSIS — I69318 Other symptoms and signs involving cognitive functions following cerebral infarction: Secondary | ICD-10-CM | POA: Diagnosis not present

## 2023-01-06 DIAGNOSIS — Z8616 Personal history of COVID-19: Secondary | ICD-10-CM | POA: Diagnosis not present

## 2023-01-06 DIAGNOSIS — R296 Repeated falls: Secondary | ICD-10-CM | POA: Diagnosis not present

## 2023-01-07 DIAGNOSIS — I69320 Aphasia following cerebral infarction: Secondary | ICD-10-CM | POA: Diagnosis not present

## 2023-01-07 DIAGNOSIS — I69318 Other symptoms and signs involving cognitive functions following cerebral infarction: Secondary | ICD-10-CM | POA: Diagnosis not present

## 2023-01-07 DIAGNOSIS — I10 Essential (primary) hypertension: Secondary | ICD-10-CM | POA: Diagnosis not present

## 2023-01-07 DIAGNOSIS — R296 Repeated falls: Secondary | ICD-10-CM | POA: Diagnosis not present

## 2023-01-07 DIAGNOSIS — Z8616 Personal history of COVID-19: Secondary | ICD-10-CM | POA: Diagnosis not present

## 2023-01-07 DIAGNOSIS — F0390 Unspecified dementia without behavioral disturbance: Secondary | ICD-10-CM | POA: Diagnosis not present

## 2023-01-08 DIAGNOSIS — Z8616 Personal history of COVID-19: Secondary | ICD-10-CM | POA: Diagnosis not present

## 2023-01-08 DIAGNOSIS — I69318 Other symptoms and signs involving cognitive functions following cerebral infarction: Secondary | ICD-10-CM | POA: Diagnosis not present

## 2023-01-08 DIAGNOSIS — Z23 Encounter for immunization: Secondary | ICD-10-CM | POA: Diagnosis not present

## 2023-01-08 DIAGNOSIS — I10 Essential (primary) hypertension: Secondary | ICD-10-CM | POA: Diagnosis not present

## 2023-01-08 DIAGNOSIS — F0390 Unspecified dementia without behavioral disturbance: Secondary | ICD-10-CM | POA: Diagnosis not present

## 2023-01-08 DIAGNOSIS — R296 Repeated falls: Secondary | ICD-10-CM | POA: Diagnosis not present

## 2023-01-08 DIAGNOSIS — I69320 Aphasia following cerebral infarction: Secondary | ICD-10-CM | POA: Diagnosis not present

## 2023-01-09 DIAGNOSIS — I69320 Aphasia following cerebral infarction: Secondary | ICD-10-CM | POA: Diagnosis not present

## 2023-01-09 DIAGNOSIS — I69318 Other symptoms and signs involving cognitive functions following cerebral infarction: Secondary | ICD-10-CM | POA: Diagnosis not present

## 2023-01-09 DIAGNOSIS — R296 Repeated falls: Secondary | ICD-10-CM | POA: Diagnosis not present

## 2023-01-09 DIAGNOSIS — F0390 Unspecified dementia without behavioral disturbance: Secondary | ICD-10-CM | POA: Diagnosis not present

## 2023-01-09 DIAGNOSIS — I10 Essential (primary) hypertension: Secondary | ICD-10-CM | POA: Diagnosis not present

## 2023-01-09 DIAGNOSIS — Z8616 Personal history of COVID-19: Secondary | ICD-10-CM | POA: Diagnosis not present

## 2023-01-10 DIAGNOSIS — I10 Essential (primary) hypertension: Secondary | ICD-10-CM | POA: Diagnosis not present

## 2023-01-10 DIAGNOSIS — I69318 Other symptoms and signs involving cognitive functions following cerebral infarction: Secondary | ICD-10-CM | POA: Diagnosis not present

## 2023-01-10 DIAGNOSIS — Z8616 Personal history of COVID-19: Secondary | ICD-10-CM | POA: Diagnosis not present

## 2023-01-10 DIAGNOSIS — I69320 Aphasia following cerebral infarction: Secondary | ICD-10-CM | POA: Diagnosis not present

## 2023-01-10 DIAGNOSIS — F0390 Unspecified dementia without behavioral disturbance: Secondary | ICD-10-CM | POA: Diagnosis not present

## 2023-01-10 DIAGNOSIS — R296 Repeated falls: Secondary | ICD-10-CM | POA: Diagnosis not present

## 2023-01-11 DIAGNOSIS — I69318 Other symptoms and signs involving cognitive functions following cerebral infarction: Secondary | ICD-10-CM | POA: Diagnosis not present

## 2023-01-11 DIAGNOSIS — I10 Essential (primary) hypertension: Secondary | ICD-10-CM | POA: Diagnosis not present

## 2023-01-11 DIAGNOSIS — Z8616 Personal history of COVID-19: Secondary | ICD-10-CM | POA: Diagnosis not present

## 2023-01-11 DIAGNOSIS — F0390 Unspecified dementia without behavioral disturbance: Secondary | ICD-10-CM | POA: Diagnosis not present

## 2023-01-11 DIAGNOSIS — R296 Repeated falls: Secondary | ICD-10-CM | POA: Diagnosis not present

## 2023-01-11 DIAGNOSIS — I69320 Aphasia following cerebral infarction: Secondary | ICD-10-CM | POA: Diagnosis not present

## 2023-01-12 DIAGNOSIS — R296 Repeated falls: Secondary | ICD-10-CM | POA: Diagnosis not present

## 2023-01-12 DIAGNOSIS — I69320 Aphasia following cerebral infarction: Secondary | ICD-10-CM | POA: Diagnosis not present

## 2023-01-12 DIAGNOSIS — I10 Essential (primary) hypertension: Secondary | ICD-10-CM | POA: Diagnosis not present

## 2023-01-12 DIAGNOSIS — I69318 Other symptoms and signs involving cognitive functions following cerebral infarction: Secondary | ICD-10-CM | POA: Diagnosis not present

## 2023-01-12 DIAGNOSIS — Z8616 Personal history of COVID-19: Secondary | ICD-10-CM | POA: Diagnosis not present

## 2023-01-12 DIAGNOSIS — F0390 Unspecified dementia without behavioral disturbance: Secondary | ICD-10-CM | POA: Diagnosis not present

## 2023-01-13 DIAGNOSIS — Z8616 Personal history of COVID-19: Secondary | ICD-10-CM | POA: Diagnosis not present

## 2023-01-13 DIAGNOSIS — I69318 Other symptoms and signs involving cognitive functions following cerebral infarction: Secondary | ICD-10-CM | POA: Diagnosis not present

## 2023-01-13 DIAGNOSIS — R21 Rash and other nonspecific skin eruption: Secondary | ICD-10-CM | POA: Diagnosis not present

## 2023-01-13 DIAGNOSIS — F0390 Unspecified dementia without behavioral disturbance: Secondary | ICD-10-CM | POA: Diagnosis not present

## 2023-01-13 DIAGNOSIS — M24521 Contracture, right elbow: Secondary | ICD-10-CM | POA: Diagnosis not present

## 2023-01-13 DIAGNOSIS — R296 Repeated falls: Secondary | ICD-10-CM | POA: Diagnosis not present

## 2023-01-13 DIAGNOSIS — R159 Full incontinence of feces: Secondary | ICD-10-CM | POA: Diagnosis not present

## 2023-01-13 DIAGNOSIS — Z741 Need for assistance with personal care: Secondary | ICD-10-CM | POA: Diagnosis not present

## 2023-01-13 DIAGNOSIS — I69320 Aphasia following cerebral infarction: Secondary | ICD-10-CM | POA: Diagnosis not present

## 2023-01-13 DIAGNOSIS — G252 Other specified forms of tremor: Secondary | ICD-10-CM | POA: Diagnosis not present

## 2023-01-13 DIAGNOSIS — R32 Unspecified urinary incontinence: Secondary | ICD-10-CM | POA: Diagnosis not present

## 2023-01-13 DIAGNOSIS — I10 Essential (primary) hypertension: Secondary | ICD-10-CM | POA: Diagnosis not present

## 2023-01-14 DIAGNOSIS — F0390 Unspecified dementia without behavioral disturbance: Secondary | ICD-10-CM | POA: Diagnosis not present

## 2023-01-14 DIAGNOSIS — I69318 Other symptoms and signs involving cognitive functions following cerebral infarction: Secondary | ICD-10-CM | POA: Diagnosis not present

## 2023-01-14 DIAGNOSIS — I10 Essential (primary) hypertension: Secondary | ICD-10-CM | POA: Diagnosis not present

## 2023-01-14 DIAGNOSIS — Z8616 Personal history of COVID-19: Secondary | ICD-10-CM | POA: Diagnosis not present

## 2023-01-14 DIAGNOSIS — R296 Repeated falls: Secondary | ICD-10-CM | POA: Diagnosis not present

## 2023-01-14 DIAGNOSIS — I69320 Aphasia following cerebral infarction: Secondary | ICD-10-CM | POA: Diagnosis not present

## 2023-01-15 DIAGNOSIS — F0390 Unspecified dementia without behavioral disturbance: Secondary | ICD-10-CM | POA: Diagnosis not present

## 2023-01-15 DIAGNOSIS — Z8616 Personal history of COVID-19: Secondary | ICD-10-CM | POA: Diagnosis not present

## 2023-01-15 DIAGNOSIS — I69320 Aphasia following cerebral infarction: Secondary | ICD-10-CM | POA: Diagnosis not present

## 2023-01-15 DIAGNOSIS — I10 Essential (primary) hypertension: Secondary | ICD-10-CM | POA: Diagnosis not present

## 2023-01-15 DIAGNOSIS — I69318 Other symptoms and signs involving cognitive functions following cerebral infarction: Secondary | ICD-10-CM | POA: Diagnosis not present

## 2023-01-15 DIAGNOSIS — R296 Repeated falls: Secondary | ICD-10-CM | POA: Diagnosis not present

## 2023-01-16 DIAGNOSIS — I69320 Aphasia following cerebral infarction: Secondary | ICD-10-CM | POA: Diagnosis not present

## 2023-01-16 DIAGNOSIS — F0390 Unspecified dementia without behavioral disturbance: Secondary | ICD-10-CM | POA: Diagnosis not present

## 2023-01-16 DIAGNOSIS — I10 Essential (primary) hypertension: Secondary | ICD-10-CM | POA: Diagnosis not present

## 2023-01-16 DIAGNOSIS — I69318 Other symptoms and signs involving cognitive functions following cerebral infarction: Secondary | ICD-10-CM | POA: Diagnosis not present

## 2023-01-16 DIAGNOSIS — Z8616 Personal history of COVID-19: Secondary | ICD-10-CM | POA: Diagnosis not present

## 2023-01-16 DIAGNOSIS — R296 Repeated falls: Secondary | ICD-10-CM | POA: Diagnosis not present

## 2023-01-17 DIAGNOSIS — I10 Essential (primary) hypertension: Secondary | ICD-10-CM | POA: Diagnosis not present

## 2023-01-17 DIAGNOSIS — I69318 Other symptoms and signs involving cognitive functions following cerebral infarction: Secondary | ICD-10-CM | POA: Diagnosis not present

## 2023-01-17 DIAGNOSIS — Z8616 Personal history of COVID-19: Secondary | ICD-10-CM | POA: Diagnosis not present

## 2023-01-17 DIAGNOSIS — F0390 Unspecified dementia without behavioral disturbance: Secondary | ICD-10-CM | POA: Diagnosis not present

## 2023-01-17 DIAGNOSIS — R296 Repeated falls: Secondary | ICD-10-CM | POA: Diagnosis not present

## 2023-01-17 DIAGNOSIS — I69320 Aphasia following cerebral infarction: Secondary | ICD-10-CM | POA: Diagnosis not present

## 2023-01-18 DIAGNOSIS — Z8616 Personal history of COVID-19: Secondary | ICD-10-CM | POA: Diagnosis not present

## 2023-01-18 DIAGNOSIS — I10 Essential (primary) hypertension: Secondary | ICD-10-CM | POA: Diagnosis not present

## 2023-01-18 DIAGNOSIS — F0390 Unspecified dementia without behavioral disturbance: Secondary | ICD-10-CM | POA: Diagnosis not present

## 2023-01-18 DIAGNOSIS — I69318 Other symptoms and signs involving cognitive functions following cerebral infarction: Secondary | ICD-10-CM | POA: Diagnosis not present

## 2023-01-18 DIAGNOSIS — I69320 Aphasia following cerebral infarction: Secondary | ICD-10-CM | POA: Diagnosis not present

## 2023-01-18 DIAGNOSIS — R296 Repeated falls: Secondary | ICD-10-CM | POA: Diagnosis not present

## 2023-01-19 DIAGNOSIS — I69318 Other symptoms and signs involving cognitive functions following cerebral infarction: Secondary | ICD-10-CM | POA: Diagnosis not present

## 2023-01-19 DIAGNOSIS — I10 Essential (primary) hypertension: Secondary | ICD-10-CM | POA: Diagnosis not present

## 2023-01-19 DIAGNOSIS — Z8616 Personal history of COVID-19: Secondary | ICD-10-CM | POA: Diagnosis not present

## 2023-01-19 DIAGNOSIS — R296 Repeated falls: Secondary | ICD-10-CM | POA: Diagnosis not present

## 2023-01-19 DIAGNOSIS — F0390 Unspecified dementia without behavioral disturbance: Secondary | ICD-10-CM | POA: Diagnosis not present

## 2023-01-19 DIAGNOSIS — I69320 Aphasia following cerebral infarction: Secondary | ICD-10-CM | POA: Diagnosis not present

## 2023-01-20 DIAGNOSIS — I69320 Aphasia following cerebral infarction: Secondary | ICD-10-CM | POA: Diagnosis not present

## 2023-01-20 DIAGNOSIS — F0390 Unspecified dementia without behavioral disturbance: Secondary | ICD-10-CM | POA: Diagnosis not present

## 2023-01-20 DIAGNOSIS — I69318 Other symptoms and signs involving cognitive functions following cerebral infarction: Secondary | ICD-10-CM | POA: Diagnosis not present

## 2023-01-20 DIAGNOSIS — I10 Essential (primary) hypertension: Secondary | ICD-10-CM | POA: Diagnosis not present

## 2023-01-20 DIAGNOSIS — R296 Repeated falls: Secondary | ICD-10-CM | POA: Diagnosis not present

## 2023-01-20 DIAGNOSIS — Z8616 Personal history of COVID-19: Secondary | ICD-10-CM | POA: Diagnosis not present

## 2023-01-21 DIAGNOSIS — I69320 Aphasia following cerebral infarction: Secondary | ICD-10-CM | POA: Diagnosis not present

## 2023-01-21 DIAGNOSIS — F0390 Unspecified dementia without behavioral disturbance: Secondary | ICD-10-CM | POA: Diagnosis not present

## 2023-01-21 DIAGNOSIS — I69318 Other symptoms and signs involving cognitive functions following cerebral infarction: Secondary | ICD-10-CM | POA: Diagnosis not present

## 2023-01-21 DIAGNOSIS — Z8616 Personal history of COVID-19: Secondary | ICD-10-CM | POA: Diagnosis not present

## 2023-01-21 DIAGNOSIS — I10 Essential (primary) hypertension: Secondary | ICD-10-CM | POA: Diagnosis not present

## 2023-01-21 DIAGNOSIS — R296 Repeated falls: Secondary | ICD-10-CM | POA: Diagnosis not present

## 2023-01-22 DIAGNOSIS — F0390 Unspecified dementia without behavioral disturbance: Secondary | ICD-10-CM | POA: Diagnosis not present

## 2023-01-22 DIAGNOSIS — R296 Repeated falls: Secondary | ICD-10-CM | POA: Diagnosis not present

## 2023-01-22 DIAGNOSIS — I69318 Other symptoms and signs involving cognitive functions following cerebral infarction: Secondary | ICD-10-CM | POA: Diagnosis not present

## 2023-01-22 DIAGNOSIS — I69320 Aphasia following cerebral infarction: Secondary | ICD-10-CM | POA: Diagnosis not present

## 2023-01-22 DIAGNOSIS — I10 Essential (primary) hypertension: Secondary | ICD-10-CM | POA: Diagnosis not present

## 2023-01-22 DIAGNOSIS — Z8616 Personal history of COVID-19: Secondary | ICD-10-CM | POA: Diagnosis not present

## 2023-01-23 DIAGNOSIS — I10 Essential (primary) hypertension: Secondary | ICD-10-CM | POA: Diagnosis not present

## 2023-01-23 DIAGNOSIS — Z8616 Personal history of COVID-19: Secondary | ICD-10-CM | POA: Diagnosis not present

## 2023-01-23 DIAGNOSIS — R296 Repeated falls: Secondary | ICD-10-CM | POA: Diagnosis not present

## 2023-01-23 DIAGNOSIS — I69318 Other symptoms and signs involving cognitive functions following cerebral infarction: Secondary | ICD-10-CM | POA: Diagnosis not present

## 2023-01-23 DIAGNOSIS — I69320 Aphasia following cerebral infarction: Secondary | ICD-10-CM | POA: Diagnosis not present

## 2023-01-23 DIAGNOSIS — F0390 Unspecified dementia without behavioral disturbance: Secondary | ICD-10-CM | POA: Diagnosis not present

## 2023-01-24 DIAGNOSIS — Z8616 Personal history of COVID-19: Secondary | ICD-10-CM | POA: Diagnosis not present

## 2023-01-24 DIAGNOSIS — F0390 Unspecified dementia without behavioral disturbance: Secondary | ICD-10-CM | POA: Diagnosis not present

## 2023-01-24 DIAGNOSIS — I69320 Aphasia following cerebral infarction: Secondary | ICD-10-CM | POA: Diagnosis not present

## 2023-01-24 DIAGNOSIS — R296 Repeated falls: Secondary | ICD-10-CM | POA: Diagnosis not present

## 2023-01-24 DIAGNOSIS — I69318 Other symptoms and signs involving cognitive functions following cerebral infarction: Secondary | ICD-10-CM | POA: Diagnosis not present

## 2023-01-24 DIAGNOSIS — I10 Essential (primary) hypertension: Secondary | ICD-10-CM | POA: Diagnosis not present

## 2023-01-25 DIAGNOSIS — I10 Essential (primary) hypertension: Secondary | ICD-10-CM | POA: Diagnosis not present

## 2023-01-25 DIAGNOSIS — I69318 Other symptoms and signs involving cognitive functions following cerebral infarction: Secondary | ICD-10-CM | POA: Diagnosis not present

## 2023-01-25 DIAGNOSIS — I69320 Aphasia following cerebral infarction: Secondary | ICD-10-CM | POA: Diagnosis not present

## 2023-01-25 DIAGNOSIS — F0390 Unspecified dementia without behavioral disturbance: Secondary | ICD-10-CM | POA: Diagnosis not present

## 2023-01-25 DIAGNOSIS — R296 Repeated falls: Secondary | ICD-10-CM | POA: Diagnosis not present

## 2023-01-25 DIAGNOSIS — Z8616 Personal history of COVID-19: Secondary | ICD-10-CM | POA: Diagnosis not present

## 2023-01-26 DIAGNOSIS — F0390 Unspecified dementia without behavioral disturbance: Secondary | ICD-10-CM | POA: Diagnosis not present

## 2023-01-26 DIAGNOSIS — Z8616 Personal history of COVID-19: Secondary | ICD-10-CM | POA: Diagnosis not present

## 2023-01-26 DIAGNOSIS — R296 Repeated falls: Secondary | ICD-10-CM | POA: Diagnosis not present

## 2023-01-26 DIAGNOSIS — I69318 Other symptoms and signs involving cognitive functions following cerebral infarction: Secondary | ICD-10-CM | POA: Diagnosis not present

## 2023-01-26 DIAGNOSIS — I69320 Aphasia following cerebral infarction: Secondary | ICD-10-CM | POA: Diagnosis not present

## 2023-01-26 DIAGNOSIS — I10 Essential (primary) hypertension: Secondary | ICD-10-CM | POA: Diagnosis not present

## 2023-01-27 DIAGNOSIS — I69318 Other symptoms and signs involving cognitive functions following cerebral infarction: Secondary | ICD-10-CM | POA: Diagnosis not present

## 2023-01-27 DIAGNOSIS — I10 Essential (primary) hypertension: Secondary | ICD-10-CM | POA: Diagnosis not present

## 2023-01-27 DIAGNOSIS — F0390 Unspecified dementia without behavioral disturbance: Secondary | ICD-10-CM | POA: Diagnosis not present

## 2023-01-27 DIAGNOSIS — Z8616 Personal history of COVID-19: Secondary | ICD-10-CM | POA: Diagnosis not present

## 2023-01-27 DIAGNOSIS — I69320 Aphasia following cerebral infarction: Secondary | ICD-10-CM | POA: Diagnosis not present

## 2023-01-27 DIAGNOSIS — R296 Repeated falls: Secondary | ICD-10-CM | POA: Diagnosis not present

## 2023-01-28 DIAGNOSIS — I69320 Aphasia following cerebral infarction: Secondary | ICD-10-CM | POA: Diagnosis not present

## 2023-01-28 DIAGNOSIS — I69318 Other symptoms and signs involving cognitive functions following cerebral infarction: Secondary | ICD-10-CM | POA: Diagnosis not present

## 2023-01-28 DIAGNOSIS — F0390 Unspecified dementia without behavioral disturbance: Secondary | ICD-10-CM | POA: Diagnosis not present

## 2023-01-28 DIAGNOSIS — Z8616 Personal history of COVID-19: Secondary | ICD-10-CM | POA: Diagnosis not present

## 2023-01-28 DIAGNOSIS — R296 Repeated falls: Secondary | ICD-10-CM | POA: Diagnosis not present

## 2023-01-28 DIAGNOSIS — I10 Essential (primary) hypertension: Secondary | ICD-10-CM | POA: Diagnosis not present

## 2023-01-29 DIAGNOSIS — I10 Essential (primary) hypertension: Secondary | ICD-10-CM | POA: Diagnosis not present

## 2023-01-29 DIAGNOSIS — I69320 Aphasia following cerebral infarction: Secondary | ICD-10-CM | POA: Diagnosis not present

## 2023-01-29 DIAGNOSIS — Z8616 Personal history of COVID-19: Secondary | ICD-10-CM | POA: Diagnosis not present

## 2023-01-29 DIAGNOSIS — I69318 Other symptoms and signs involving cognitive functions following cerebral infarction: Secondary | ICD-10-CM | POA: Diagnosis not present

## 2023-01-29 DIAGNOSIS — R296 Repeated falls: Secondary | ICD-10-CM | POA: Diagnosis not present

## 2023-01-29 DIAGNOSIS — F0390 Unspecified dementia without behavioral disturbance: Secondary | ICD-10-CM | POA: Diagnosis not present

## 2023-01-30 DIAGNOSIS — F0390 Unspecified dementia without behavioral disturbance: Secondary | ICD-10-CM | POA: Diagnosis not present

## 2023-01-30 DIAGNOSIS — R296 Repeated falls: Secondary | ICD-10-CM | POA: Diagnosis not present

## 2023-01-30 DIAGNOSIS — Z8616 Personal history of COVID-19: Secondary | ICD-10-CM | POA: Diagnosis not present

## 2023-01-30 DIAGNOSIS — I10 Essential (primary) hypertension: Secondary | ICD-10-CM | POA: Diagnosis not present

## 2023-01-30 DIAGNOSIS — I69320 Aphasia following cerebral infarction: Secondary | ICD-10-CM | POA: Diagnosis not present

## 2023-01-30 DIAGNOSIS — I69318 Other symptoms and signs involving cognitive functions following cerebral infarction: Secondary | ICD-10-CM | POA: Diagnosis not present

## 2023-01-31 DIAGNOSIS — I10 Essential (primary) hypertension: Secondary | ICD-10-CM | POA: Diagnosis not present

## 2023-01-31 DIAGNOSIS — Z8616 Personal history of COVID-19: Secondary | ICD-10-CM | POA: Diagnosis not present

## 2023-01-31 DIAGNOSIS — I69320 Aphasia following cerebral infarction: Secondary | ICD-10-CM | POA: Diagnosis not present

## 2023-01-31 DIAGNOSIS — I69318 Other symptoms and signs involving cognitive functions following cerebral infarction: Secondary | ICD-10-CM | POA: Diagnosis not present

## 2023-01-31 DIAGNOSIS — R296 Repeated falls: Secondary | ICD-10-CM | POA: Diagnosis not present

## 2023-01-31 DIAGNOSIS — F0390 Unspecified dementia without behavioral disturbance: Secondary | ICD-10-CM | POA: Diagnosis not present

## 2023-02-01 DIAGNOSIS — I69320 Aphasia following cerebral infarction: Secondary | ICD-10-CM | POA: Diagnosis not present

## 2023-02-01 DIAGNOSIS — F0390 Unspecified dementia without behavioral disturbance: Secondary | ICD-10-CM | POA: Diagnosis not present

## 2023-02-01 DIAGNOSIS — I69318 Other symptoms and signs involving cognitive functions following cerebral infarction: Secondary | ICD-10-CM | POA: Diagnosis not present

## 2023-02-01 DIAGNOSIS — R296 Repeated falls: Secondary | ICD-10-CM | POA: Diagnosis not present

## 2023-02-01 DIAGNOSIS — I10 Essential (primary) hypertension: Secondary | ICD-10-CM | POA: Diagnosis not present

## 2023-02-01 DIAGNOSIS — Z8616 Personal history of COVID-19: Secondary | ICD-10-CM | POA: Diagnosis not present

## 2023-02-02 DIAGNOSIS — R296 Repeated falls: Secondary | ICD-10-CM | POA: Diagnosis not present

## 2023-02-02 DIAGNOSIS — I69320 Aphasia following cerebral infarction: Secondary | ICD-10-CM | POA: Diagnosis not present

## 2023-02-02 DIAGNOSIS — Z8616 Personal history of COVID-19: Secondary | ICD-10-CM | POA: Diagnosis not present

## 2023-02-02 DIAGNOSIS — I69318 Other symptoms and signs involving cognitive functions following cerebral infarction: Secondary | ICD-10-CM | POA: Diagnosis not present

## 2023-02-02 DIAGNOSIS — I10 Essential (primary) hypertension: Secondary | ICD-10-CM | POA: Diagnosis not present

## 2023-02-02 DIAGNOSIS — F0390 Unspecified dementia without behavioral disturbance: Secondary | ICD-10-CM | POA: Diagnosis not present

## 2023-02-03 DIAGNOSIS — I69320 Aphasia following cerebral infarction: Secondary | ICD-10-CM | POA: Diagnosis not present

## 2023-02-03 DIAGNOSIS — I69318 Other symptoms and signs involving cognitive functions following cerebral infarction: Secondary | ICD-10-CM | POA: Diagnosis not present

## 2023-02-03 DIAGNOSIS — R296 Repeated falls: Secondary | ICD-10-CM | POA: Diagnosis not present

## 2023-02-03 DIAGNOSIS — Z8616 Personal history of COVID-19: Secondary | ICD-10-CM | POA: Diagnosis not present

## 2023-02-03 DIAGNOSIS — I10 Essential (primary) hypertension: Secondary | ICD-10-CM | POA: Diagnosis not present

## 2023-02-03 DIAGNOSIS — F0390 Unspecified dementia without behavioral disturbance: Secondary | ICD-10-CM | POA: Diagnosis not present

## 2023-02-04 DIAGNOSIS — Z8616 Personal history of COVID-19: Secondary | ICD-10-CM | POA: Diagnosis not present

## 2023-02-04 DIAGNOSIS — F0390 Unspecified dementia without behavioral disturbance: Secondary | ICD-10-CM | POA: Diagnosis not present

## 2023-02-04 DIAGNOSIS — I69320 Aphasia following cerebral infarction: Secondary | ICD-10-CM | POA: Diagnosis not present

## 2023-02-04 DIAGNOSIS — I69318 Other symptoms and signs involving cognitive functions following cerebral infarction: Secondary | ICD-10-CM | POA: Diagnosis not present

## 2023-02-04 DIAGNOSIS — R296 Repeated falls: Secondary | ICD-10-CM | POA: Diagnosis not present

## 2023-02-04 DIAGNOSIS — I10 Essential (primary) hypertension: Secondary | ICD-10-CM | POA: Diagnosis not present

## 2023-02-05 DIAGNOSIS — R296 Repeated falls: Secondary | ICD-10-CM | POA: Diagnosis not present

## 2023-02-05 DIAGNOSIS — Z8616 Personal history of COVID-19: Secondary | ICD-10-CM | POA: Diagnosis not present

## 2023-02-05 DIAGNOSIS — F0390 Unspecified dementia without behavioral disturbance: Secondary | ICD-10-CM | POA: Diagnosis not present

## 2023-02-05 DIAGNOSIS — I69318 Other symptoms and signs involving cognitive functions following cerebral infarction: Secondary | ICD-10-CM | POA: Diagnosis not present

## 2023-02-05 DIAGNOSIS — I10 Essential (primary) hypertension: Secondary | ICD-10-CM | POA: Diagnosis not present

## 2023-02-05 DIAGNOSIS — I69320 Aphasia following cerebral infarction: Secondary | ICD-10-CM | POA: Diagnosis not present

## 2023-02-06 DIAGNOSIS — R296 Repeated falls: Secondary | ICD-10-CM | POA: Diagnosis not present

## 2023-02-06 DIAGNOSIS — I10 Essential (primary) hypertension: Secondary | ICD-10-CM | POA: Diagnosis not present

## 2023-02-06 DIAGNOSIS — I69320 Aphasia following cerebral infarction: Secondary | ICD-10-CM | POA: Diagnosis not present

## 2023-02-06 DIAGNOSIS — F0390 Unspecified dementia without behavioral disturbance: Secondary | ICD-10-CM | POA: Diagnosis not present

## 2023-02-06 DIAGNOSIS — Z8616 Personal history of COVID-19: Secondary | ICD-10-CM | POA: Diagnosis not present

## 2023-02-06 DIAGNOSIS — I69318 Other symptoms and signs involving cognitive functions following cerebral infarction: Secondary | ICD-10-CM | POA: Diagnosis not present

## 2023-02-07 DIAGNOSIS — I69318 Other symptoms and signs involving cognitive functions following cerebral infarction: Secondary | ICD-10-CM | POA: Diagnosis not present

## 2023-02-07 DIAGNOSIS — I69320 Aphasia following cerebral infarction: Secondary | ICD-10-CM | POA: Diagnosis not present

## 2023-02-07 DIAGNOSIS — I10 Essential (primary) hypertension: Secondary | ICD-10-CM | POA: Diagnosis not present

## 2023-02-07 DIAGNOSIS — Z8616 Personal history of COVID-19: Secondary | ICD-10-CM | POA: Diagnosis not present

## 2023-02-07 DIAGNOSIS — R296 Repeated falls: Secondary | ICD-10-CM | POA: Diagnosis not present

## 2023-02-07 DIAGNOSIS — F0390 Unspecified dementia without behavioral disturbance: Secondary | ICD-10-CM | POA: Diagnosis not present

## 2023-02-08 DIAGNOSIS — Z8616 Personal history of COVID-19: Secondary | ICD-10-CM | POA: Diagnosis not present

## 2023-02-08 DIAGNOSIS — I69320 Aphasia following cerebral infarction: Secondary | ICD-10-CM | POA: Diagnosis not present

## 2023-02-08 DIAGNOSIS — I10 Essential (primary) hypertension: Secondary | ICD-10-CM | POA: Diagnosis not present

## 2023-02-08 DIAGNOSIS — R296 Repeated falls: Secondary | ICD-10-CM | POA: Diagnosis not present

## 2023-02-08 DIAGNOSIS — F0390 Unspecified dementia without behavioral disturbance: Secondary | ICD-10-CM | POA: Diagnosis not present

## 2023-02-08 DIAGNOSIS — I69318 Other symptoms and signs involving cognitive functions following cerebral infarction: Secondary | ICD-10-CM | POA: Diagnosis not present

## 2023-02-09 DIAGNOSIS — R296 Repeated falls: Secondary | ICD-10-CM | POA: Diagnosis not present

## 2023-02-09 DIAGNOSIS — F0390 Unspecified dementia without behavioral disturbance: Secondary | ICD-10-CM | POA: Diagnosis not present

## 2023-02-09 DIAGNOSIS — Z8616 Personal history of COVID-19: Secondary | ICD-10-CM | POA: Diagnosis not present

## 2023-02-09 DIAGNOSIS — I10 Essential (primary) hypertension: Secondary | ICD-10-CM | POA: Diagnosis not present

## 2023-02-09 DIAGNOSIS — I69320 Aphasia following cerebral infarction: Secondary | ICD-10-CM | POA: Diagnosis not present

## 2023-02-09 DIAGNOSIS — I69318 Other symptoms and signs involving cognitive functions following cerebral infarction: Secondary | ICD-10-CM | POA: Diagnosis not present

## 2023-02-10 DIAGNOSIS — I10 Essential (primary) hypertension: Secondary | ICD-10-CM | POA: Diagnosis not present

## 2023-02-10 DIAGNOSIS — R296 Repeated falls: Secondary | ICD-10-CM | POA: Diagnosis not present

## 2023-02-10 DIAGNOSIS — I69320 Aphasia following cerebral infarction: Secondary | ICD-10-CM | POA: Diagnosis not present

## 2023-02-10 DIAGNOSIS — I69318 Other symptoms and signs involving cognitive functions following cerebral infarction: Secondary | ICD-10-CM | POA: Diagnosis not present

## 2023-02-10 DIAGNOSIS — Z8616 Personal history of COVID-19: Secondary | ICD-10-CM | POA: Diagnosis not present

## 2023-02-10 DIAGNOSIS — F0390 Unspecified dementia without behavioral disturbance: Secondary | ICD-10-CM | POA: Diagnosis not present

## 2023-02-11 DIAGNOSIS — I10 Essential (primary) hypertension: Secondary | ICD-10-CM | POA: Diagnosis not present

## 2023-02-11 DIAGNOSIS — I69320 Aphasia following cerebral infarction: Secondary | ICD-10-CM | POA: Diagnosis not present

## 2023-02-11 DIAGNOSIS — Z8616 Personal history of COVID-19: Secondary | ICD-10-CM | POA: Diagnosis not present

## 2023-02-11 DIAGNOSIS — I69318 Other symptoms and signs involving cognitive functions following cerebral infarction: Secondary | ICD-10-CM | POA: Diagnosis not present

## 2023-02-11 DIAGNOSIS — R296 Repeated falls: Secondary | ICD-10-CM | POA: Diagnosis not present

## 2023-02-11 DIAGNOSIS — F0390 Unspecified dementia without behavioral disturbance: Secondary | ICD-10-CM | POA: Diagnosis not present

## 2023-02-12 DIAGNOSIS — F0390 Unspecified dementia without behavioral disturbance: Secondary | ICD-10-CM | POA: Diagnosis not present

## 2023-02-12 DIAGNOSIS — Z8616 Personal history of COVID-19: Secondary | ICD-10-CM | POA: Diagnosis not present

## 2023-02-12 DIAGNOSIS — R296 Repeated falls: Secondary | ICD-10-CM | POA: Diagnosis not present

## 2023-02-12 DIAGNOSIS — I69320 Aphasia following cerebral infarction: Secondary | ICD-10-CM | POA: Diagnosis not present

## 2023-02-12 DIAGNOSIS — I10 Essential (primary) hypertension: Secondary | ICD-10-CM | POA: Diagnosis not present

## 2023-02-12 DIAGNOSIS — I69318 Other symptoms and signs involving cognitive functions following cerebral infarction: Secondary | ICD-10-CM | POA: Diagnosis not present

## 2023-02-13 DIAGNOSIS — R296 Repeated falls: Secondary | ICD-10-CM | POA: Diagnosis not present

## 2023-02-13 DIAGNOSIS — M24521 Contracture, right elbow: Secondary | ICD-10-CM | POA: Diagnosis not present

## 2023-02-13 DIAGNOSIS — G252 Other specified forms of tremor: Secondary | ICD-10-CM | POA: Diagnosis not present

## 2023-02-13 DIAGNOSIS — F0390 Unspecified dementia without behavioral disturbance: Secondary | ICD-10-CM | POA: Diagnosis not present

## 2023-02-13 DIAGNOSIS — R32 Unspecified urinary incontinence: Secondary | ICD-10-CM | POA: Diagnosis not present

## 2023-02-13 DIAGNOSIS — I69320 Aphasia following cerebral infarction: Secondary | ICD-10-CM | POA: Diagnosis not present

## 2023-02-13 DIAGNOSIS — R159 Full incontinence of feces: Secondary | ICD-10-CM | POA: Diagnosis not present

## 2023-02-13 DIAGNOSIS — Z741 Need for assistance with personal care: Secondary | ICD-10-CM | POA: Diagnosis not present

## 2023-02-13 DIAGNOSIS — Z8616 Personal history of COVID-19: Secondary | ICD-10-CM | POA: Diagnosis not present

## 2023-02-13 DIAGNOSIS — I69318 Other symptoms and signs involving cognitive functions following cerebral infarction: Secondary | ICD-10-CM | POA: Diagnosis not present

## 2023-02-13 DIAGNOSIS — I10 Essential (primary) hypertension: Secondary | ICD-10-CM | POA: Diagnosis not present

## 2023-02-13 DIAGNOSIS — R21 Rash and other nonspecific skin eruption: Secondary | ICD-10-CM | POA: Diagnosis not present

## 2023-02-16 DIAGNOSIS — I69320 Aphasia following cerebral infarction: Secondary | ICD-10-CM | POA: Diagnosis not present

## 2023-02-16 DIAGNOSIS — F0390 Unspecified dementia without behavioral disturbance: Secondary | ICD-10-CM | POA: Diagnosis not present

## 2023-02-16 DIAGNOSIS — I10 Essential (primary) hypertension: Secondary | ICD-10-CM | POA: Diagnosis not present

## 2023-02-16 DIAGNOSIS — R296 Repeated falls: Secondary | ICD-10-CM | POA: Diagnosis not present

## 2023-02-16 DIAGNOSIS — Z8616 Personal history of COVID-19: Secondary | ICD-10-CM | POA: Diagnosis not present

## 2023-02-16 DIAGNOSIS — I69318 Other symptoms and signs involving cognitive functions following cerebral infarction: Secondary | ICD-10-CM | POA: Diagnosis not present

## 2023-02-17 DIAGNOSIS — Z8616 Personal history of COVID-19: Secondary | ICD-10-CM | POA: Diagnosis not present

## 2023-02-17 DIAGNOSIS — F0390 Unspecified dementia without behavioral disturbance: Secondary | ICD-10-CM | POA: Diagnosis not present

## 2023-02-17 DIAGNOSIS — R296 Repeated falls: Secondary | ICD-10-CM | POA: Diagnosis not present

## 2023-02-17 DIAGNOSIS — I69320 Aphasia following cerebral infarction: Secondary | ICD-10-CM | POA: Diagnosis not present

## 2023-02-17 DIAGNOSIS — I10 Essential (primary) hypertension: Secondary | ICD-10-CM | POA: Diagnosis not present

## 2023-02-17 DIAGNOSIS — I69318 Other symptoms and signs involving cognitive functions following cerebral infarction: Secondary | ICD-10-CM | POA: Diagnosis not present

## 2023-02-19 DIAGNOSIS — Z8616 Personal history of COVID-19: Secondary | ICD-10-CM | POA: Diagnosis not present

## 2023-02-19 DIAGNOSIS — I69320 Aphasia following cerebral infarction: Secondary | ICD-10-CM | POA: Diagnosis not present

## 2023-02-19 DIAGNOSIS — R296 Repeated falls: Secondary | ICD-10-CM | POA: Diagnosis not present

## 2023-02-19 DIAGNOSIS — I10 Essential (primary) hypertension: Secondary | ICD-10-CM | POA: Diagnosis not present

## 2023-02-19 DIAGNOSIS — I69318 Other symptoms and signs involving cognitive functions following cerebral infarction: Secondary | ICD-10-CM | POA: Diagnosis not present

## 2023-02-19 DIAGNOSIS — F0390 Unspecified dementia without behavioral disturbance: Secondary | ICD-10-CM | POA: Diagnosis not present

## 2023-02-24 DIAGNOSIS — I10 Essential (primary) hypertension: Secondary | ICD-10-CM | POA: Diagnosis not present

## 2023-02-24 DIAGNOSIS — Z8616 Personal history of COVID-19: Secondary | ICD-10-CM | POA: Diagnosis not present

## 2023-02-24 DIAGNOSIS — R296 Repeated falls: Secondary | ICD-10-CM | POA: Diagnosis not present

## 2023-02-24 DIAGNOSIS — I69318 Other symptoms and signs involving cognitive functions following cerebral infarction: Secondary | ICD-10-CM | POA: Diagnosis not present

## 2023-02-24 DIAGNOSIS — I69320 Aphasia following cerebral infarction: Secondary | ICD-10-CM | POA: Diagnosis not present

## 2023-02-24 DIAGNOSIS — F0390 Unspecified dementia without behavioral disturbance: Secondary | ICD-10-CM | POA: Diagnosis not present

## 2023-02-26 DIAGNOSIS — I69318 Other symptoms and signs involving cognitive functions following cerebral infarction: Secondary | ICD-10-CM | POA: Diagnosis not present

## 2023-02-26 DIAGNOSIS — Z8616 Personal history of COVID-19: Secondary | ICD-10-CM | POA: Diagnosis not present

## 2023-02-26 DIAGNOSIS — I10 Essential (primary) hypertension: Secondary | ICD-10-CM | POA: Diagnosis not present

## 2023-02-26 DIAGNOSIS — F0390 Unspecified dementia without behavioral disturbance: Secondary | ICD-10-CM | POA: Diagnosis not present

## 2023-02-26 DIAGNOSIS — R296 Repeated falls: Secondary | ICD-10-CM | POA: Diagnosis not present

## 2023-02-26 DIAGNOSIS — I69320 Aphasia following cerebral infarction: Secondary | ICD-10-CM | POA: Diagnosis not present

## 2023-03-03 DIAGNOSIS — I10 Essential (primary) hypertension: Secondary | ICD-10-CM | POA: Diagnosis not present

## 2023-03-03 DIAGNOSIS — R296 Repeated falls: Secondary | ICD-10-CM | POA: Diagnosis not present

## 2023-03-03 DIAGNOSIS — Z8616 Personal history of COVID-19: Secondary | ICD-10-CM | POA: Diagnosis not present

## 2023-03-03 DIAGNOSIS — I69318 Other symptoms and signs involving cognitive functions following cerebral infarction: Secondary | ICD-10-CM | POA: Diagnosis not present

## 2023-03-03 DIAGNOSIS — I69320 Aphasia following cerebral infarction: Secondary | ICD-10-CM | POA: Diagnosis not present

## 2023-03-03 DIAGNOSIS — F0390 Unspecified dementia without behavioral disturbance: Secondary | ICD-10-CM | POA: Diagnosis not present

## 2023-03-05 DIAGNOSIS — I69318 Other symptoms and signs involving cognitive functions following cerebral infarction: Secondary | ICD-10-CM | POA: Diagnosis not present

## 2023-03-05 DIAGNOSIS — I10 Essential (primary) hypertension: Secondary | ICD-10-CM | POA: Diagnosis not present

## 2023-03-05 DIAGNOSIS — Z8616 Personal history of COVID-19: Secondary | ICD-10-CM | POA: Diagnosis not present

## 2023-03-05 DIAGNOSIS — F0390 Unspecified dementia without behavioral disturbance: Secondary | ICD-10-CM | POA: Diagnosis not present

## 2023-03-05 DIAGNOSIS — R296 Repeated falls: Secondary | ICD-10-CM | POA: Diagnosis not present

## 2023-03-05 DIAGNOSIS — I69320 Aphasia following cerebral infarction: Secondary | ICD-10-CM | POA: Diagnosis not present

## 2023-03-06 DIAGNOSIS — I10 Essential (primary) hypertension: Secondary | ICD-10-CM | POA: Diagnosis not present

## 2023-03-06 DIAGNOSIS — I69318 Other symptoms and signs involving cognitive functions following cerebral infarction: Secondary | ICD-10-CM | POA: Diagnosis not present

## 2023-03-06 DIAGNOSIS — Z8616 Personal history of COVID-19: Secondary | ICD-10-CM | POA: Diagnosis not present

## 2023-03-06 DIAGNOSIS — I69320 Aphasia following cerebral infarction: Secondary | ICD-10-CM | POA: Diagnosis not present

## 2023-03-06 DIAGNOSIS — R296 Repeated falls: Secondary | ICD-10-CM | POA: Diagnosis not present

## 2023-03-06 DIAGNOSIS — F0390 Unspecified dementia without behavioral disturbance: Secondary | ICD-10-CM | POA: Diagnosis not present

## 2023-03-08 DIAGNOSIS — Z8616 Personal history of COVID-19: Secondary | ICD-10-CM | POA: Diagnosis not present

## 2023-03-08 DIAGNOSIS — I69318 Other symptoms and signs involving cognitive functions following cerebral infarction: Secondary | ICD-10-CM | POA: Diagnosis not present

## 2023-03-08 DIAGNOSIS — I10 Essential (primary) hypertension: Secondary | ICD-10-CM | POA: Diagnosis not present

## 2023-03-08 DIAGNOSIS — R296 Repeated falls: Secondary | ICD-10-CM | POA: Diagnosis not present

## 2023-03-08 DIAGNOSIS — I69320 Aphasia following cerebral infarction: Secondary | ICD-10-CM | POA: Diagnosis not present

## 2023-03-08 DIAGNOSIS — F0390 Unspecified dementia without behavioral disturbance: Secondary | ICD-10-CM | POA: Diagnosis not present

## 2023-03-10 DIAGNOSIS — I10 Essential (primary) hypertension: Secondary | ICD-10-CM | POA: Diagnosis not present

## 2023-03-10 DIAGNOSIS — R296 Repeated falls: Secondary | ICD-10-CM | POA: Diagnosis not present

## 2023-03-10 DIAGNOSIS — I69320 Aphasia following cerebral infarction: Secondary | ICD-10-CM | POA: Diagnosis not present

## 2023-03-10 DIAGNOSIS — I69318 Other symptoms and signs involving cognitive functions following cerebral infarction: Secondary | ICD-10-CM | POA: Diagnosis not present

## 2023-03-10 DIAGNOSIS — Z8616 Personal history of COVID-19: Secondary | ICD-10-CM | POA: Diagnosis not present

## 2023-03-10 DIAGNOSIS — F0390 Unspecified dementia without behavioral disturbance: Secondary | ICD-10-CM | POA: Diagnosis not present

## 2023-03-12 DIAGNOSIS — Z8616 Personal history of COVID-19: Secondary | ICD-10-CM | POA: Diagnosis not present

## 2023-03-12 DIAGNOSIS — R296 Repeated falls: Secondary | ICD-10-CM | POA: Diagnosis not present

## 2023-03-12 DIAGNOSIS — I69320 Aphasia following cerebral infarction: Secondary | ICD-10-CM | POA: Diagnosis not present

## 2023-03-12 DIAGNOSIS — F0390 Unspecified dementia without behavioral disturbance: Secondary | ICD-10-CM | POA: Diagnosis not present

## 2023-03-12 DIAGNOSIS — I69318 Other symptoms and signs involving cognitive functions following cerebral infarction: Secondary | ICD-10-CM | POA: Diagnosis not present

## 2023-03-12 DIAGNOSIS — I10 Essential (primary) hypertension: Secondary | ICD-10-CM | POA: Diagnosis not present

## 2023-03-15 DIAGNOSIS — I69320 Aphasia following cerebral infarction: Secondary | ICD-10-CM | POA: Diagnosis not present

## 2023-03-15 DIAGNOSIS — I10 Essential (primary) hypertension: Secondary | ICD-10-CM | POA: Diagnosis not present

## 2023-03-15 DIAGNOSIS — R159 Full incontinence of feces: Secondary | ICD-10-CM | POA: Diagnosis not present

## 2023-03-15 DIAGNOSIS — Z8616 Personal history of COVID-19: Secondary | ICD-10-CM | POA: Diagnosis not present

## 2023-03-15 DIAGNOSIS — Z741 Need for assistance with personal care: Secondary | ICD-10-CM | POA: Diagnosis not present

## 2023-03-15 DIAGNOSIS — F0390 Unspecified dementia without behavioral disturbance: Secondary | ICD-10-CM | POA: Diagnosis not present

## 2023-03-15 DIAGNOSIS — R32 Unspecified urinary incontinence: Secondary | ICD-10-CM | POA: Diagnosis not present

## 2023-03-15 DIAGNOSIS — M24521 Contracture, right elbow: Secondary | ICD-10-CM | POA: Diagnosis not present

## 2023-03-15 DIAGNOSIS — I69318 Other symptoms and signs involving cognitive functions following cerebral infarction: Secondary | ICD-10-CM | POA: Diagnosis not present

## 2023-03-15 DIAGNOSIS — R296 Repeated falls: Secondary | ICD-10-CM | POA: Diagnosis not present

## 2023-03-15 DIAGNOSIS — G252 Other specified forms of tremor: Secondary | ICD-10-CM | POA: Diagnosis not present

## 2023-03-15 DIAGNOSIS — R21 Rash and other nonspecific skin eruption: Secondary | ICD-10-CM | POA: Diagnosis not present

## 2023-03-17 DIAGNOSIS — I10 Essential (primary) hypertension: Secondary | ICD-10-CM | POA: Diagnosis not present

## 2023-03-17 DIAGNOSIS — I69318 Other symptoms and signs involving cognitive functions following cerebral infarction: Secondary | ICD-10-CM | POA: Diagnosis not present

## 2023-03-17 DIAGNOSIS — F0390 Unspecified dementia without behavioral disturbance: Secondary | ICD-10-CM | POA: Diagnosis not present

## 2023-03-17 DIAGNOSIS — R296 Repeated falls: Secondary | ICD-10-CM | POA: Diagnosis not present

## 2023-03-17 DIAGNOSIS — I69320 Aphasia following cerebral infarction: Secondary | ICD-10-CM | POA: Diagnosis not present

## 2023-03-17 DIAGNOSIS — Z8616 Personal history of COVID-19: Secondary | ICD-10-CM | POA: Diagnosis not present

## 2023-03-19 DIAGNOSIS — L84 Corns and callosities: Secondary | ICD-10-CM | POA: Diagnosis not present

## 2023-03-19 DIAGNOSIS — Z8616 Personal history of COVID-19: Secondary | ICD-10-CM | POA: Diagnosis not present

## 2023-03-19 DIAGNOSIS — B351 Tinea unguium: Secondary | ICD-10-CM | POA: Diagnosis not present

## 2023-03-19 DIAGNOSIS — I10 Essential (primary) hypertension: Secondary | ICD-10-CM | POA: Diagnosis not present

## 2023-03-19 DIAGNOSIS — M2041 Other hammer toe(s) (acquired), right foot: Secondary | ICD-10-CM | POA: Diagnosis not present

## 2023-03-19 DIAGNOSIS — M79675 Pain in left toe(s): Secondary | ICD-10-CM | POA: Diagnosis not present

## 2023-03-19 DIAGNOSIS — I69320 Aphasia following cerebral infarction: Secondary | ICD-10-CM | POA: Diagnosis not present

## 2023-03-19 DIAGNOSIS — R296 Repeated falls: Secondary | ICD-10-CM | POA: Diagnosis not present

## 2023-03-19 DIAGNOSIS — I69318 Other symptoms and signs involving cognitive functions following cerebral infarction: Secondary | ICD-10-CM | POA: Diagnosis not present

## 2023-03-19 DIAGNOSIS — F0390 Unspecified dementia without behavioral disturbance: Secondary | ICD-10-CM | POA: Diagnosis not present

## 2023-03-19 DIAGNOSIS — I739 Peripheral vascular disease, unspecified: Secondary | ICD-10-CM | POA: Diagnosis not present

## 2023-03-23 DIAGNOSIS — F0390 Unspecified dementia without behavioral disturbance: Secondary | ICD-10-CM | POA: Diagnosis not present

## 2023-03-23 DIAGNOSIS — R296 Repeated falls: Secondary | ICD-10-CM | POA: Diagnosis not present

## 2023-03-23 DIAGNOSIS — I10 Essential (primary) hypertension: Secondary | ICD-10-CM | POA: Diagnosis not present

## 2023-03-23 DIAGNOSIS — I69320 Aphasia following cerebral infarction: Secondary | ICD-10-CM | POA: Diagnosis not present

## 2023-03-23 DIAGNOSIS — I69318 Other symptoms and signs involving cognitive functions following cerebral infarction: Secondary | ICD-10-CM | POA: Diagnosis not present

## 2023-03-23 DIAGNOSIS — Z8616 Personal history of COVID-19: Secondary | ICD-10-CM | POA: Diagnosis not present

## 2023-03-24 DIAGNOSIS — I69318 Other symptoms and signs involving cognitive functions following cerebral infarction: Secondary | ICD-10-CM | POA: Diagnosis not present

## 2023-03-24 DIAGNOSIS — I10 Essential (primary) hypertension: Secondary | ICD-10-CM | POA: Diagnosis not present

## 2023-03-24 DIAGNOSIS — I69320 Aphasia following cerebral infarction: Secondary | ICD-10-CM | POA: Diagnosis not present

## 2023-03-24 DIAGNOSIS — Z8616 Personal history of COVID-19: Secondary | ICD-10-CM | POA: Diagnosis not present

## 2023-03-24 DIAGNOSIS — R296 Repeated falls: Secondary | ICD-10-CM | POA: Diagnosis not present

## 2023-03-24 DIAGNOSIS — F0390 Unspecified dementia without behavioral disturbance: Secondary | ICD-10-CM | POA: Diagnosis not present

## 2023-03-26 DIAGNOSIS — R296 Repeated falls: Secondary | ICD-10-CM | POA: Diagnosis not present

## 2023-03-26 DIAGNOSIS — F0390 Unspecified dementia without behavioral disturbance: Secondary | ICD-10-CM | POA: Diagnosis not present

## 2023-03-26 DIAGNOSIS — Z8616 Personal history of COVID-19: Secondary | ICD-10-CM | POA: Diagnosis not present

## 2023-03-26 DIAGNOSIS — I69318 Other symptoms and signs involving cognitive functions following cerebral infarction: Secondary | ICD-10-CM | POA: Diagnosis not present

## 2023-03-26 DIAGNOSIS — I69320 Aphasia following cerebral infarction: Secondary | ICD-10-CM | POA: Diagnosis not present

## 2023-03-26 DIAGNOSIS — I10 Essential (primary) hypertension: Secondary | ICD-10-CM | POA: Diagnosis not present

## 2023-03-31 DIAGNOSIS — Z8616 Personal history of COVID-19: Secondary | ICD-10-CM | POA: Diagnosis not present

## 2023-03-31 DIAGNOSIS — I69318 Other symptoms and signs involving cognitive functions following cerebral infarction: Secondary | ICD-10-CM | POA: Diagnosis not present

## 2023-03-31 DIAGNOSIS — R296 Repeated falls: Secondary | ICD-10-CM | POA: Diagnosis not present

## 2023-03-31 DIAGNOSIS — I69320 Aphasia following cerebral infarction: Secondary | ICD-10-CM | POA: Diagnosis not present

## 2023-03-31 DIAGNOSIS — I10 Essential (primary) hypertension: Secondary | ICD-10-CM | POA: Diagnosis not present

## 2023-03-31 DIAGNOSIS — F0390 Unspecified dementia without behavioral disturbance: Secondary | ICD-10-CM | POA: Diagnosis not present

## 2023-04-02 DIAGNOSIS — Z8616 Personal history of COVID-19: Secondary | ICD-10-CM | POA: Diagnosis not present

## 2023-04-02 DIAGNOSIS — F0390 Unspecified dementia without behavioral disturbance: Secondary | ICD-10-CM | POA: Diagnosis not present

## 2023-04-02 DIAGNOSIS — I10 Essential (primary) hypertension: Secondary | ICD-10-CM | POA: Diagnosis not present

## 2023-04-02 DIAGNOSIS — I69318 Other symptoms and signs involving cognitive functions following cerebral infarction: Secondary | ICD-10-CM | POA: Diagnosis not present

## 2023-04-02 DIAGNOSIS — I69320 Aphasia following cerebral infarction: Secondary | ICD-10-CM | POA: Diagnosis not present

## 2023-04-02 DIAGNOSIS — R296 Repeated falls: Secondary | ICD-10-CM | POA: Diagnosis not present

## 2023-04-06 DIAGNOSIS — I10 Essential (primary) hypertension: Secondary | ICD-10-CM | POA: Diagnosis not present

## 2023-04-06 DIAGNOSIS — R296 Repeated falls: Secondary | ICD-10-CM | POA: Diagnosis not present

## 2023-04-06 DIAGNOSIS — F0390 Unspecified dementia without behavioral disturbance: Secondary | ICD-10-CM | POA: Diagnosis not present

## 2023-04-06 DIAGNOSIS — Z8616 Personal history of COVID-19: Secondary | ICD-10-CM | POA: Diagnosis not present

## 2023-04-06 DIAGNOSIS — I69320 Aphasia following cerebral infarction: Secondary | ICD-10-CM | POA: Diagnosis not present

## 2023-04-06 DIAGNOSIS — I69318 Other symptoms and signs involving cognitive functions following cerebral infarction: Secondary | ICD-10-CM | POA: Diagnosis not present

## 2023-04-07 DIAGNOSIS — I10 Essential (primary) hypertension: Secondary | ICD-10-CM | POA: Diagnosis not present

## 2023-04-07 DIAGNOSIS — F0390 Unspecified dementia without behavioral disturbance: Secondary | ICD-10-CM | POA: Diagnosis not present

## 2023-04-07 DIAGNOSIS — R296 Repeated falls: Secondary | ICD-10-CM | POA: Diagnosis not present

## 2023-04-07 DIAGNOSIS — I69318 Other symptoms and signs involving cognitive functions following cerebral infarction: Secondary | ICD-10-CM | POA: Diagnosis not present

## 2023-04-07 DIAGNOSIS — I69320 Aphasia following cerebral infarction: Secondary | ICD-10-CM | POA: Diagnosis not present

## 2023-04-07 DIAGNOSIS — Z8616 Personal history of COVID-19: Secondary | ICD-10-CM | POA: Diagnosis not present

## 2023-04-09 DIAGNOSIS — F0390 Unspecified dementia without behavioral disturbance: Secondary | ICD-10-CM | POA: Diagnosis not present

## 2023-04-09 DIAGNOSIS — I69318 Other symptoms and signs involving cognitive functions following cerebral infarction: Secondary | ICD-10-CM | POA: Diagnosis not present

## 2023-04-09 DIAGNOSIS — R296 Repeated falls: Secondary | ICD-10-CM | POA: Diagnosis not present

## 2023-04-09 DIAGNOSIS — Z8616 Personal history of COVID-19: Secondary | ICD-10-CM | POA: Diagnosis not present

## 2023-04-09 DIAGNOSIS — I10 Essential (primary) hypertension: Secondary | ICD-10-CM | POA: Diagnosis not present

## 2023-04-09 DIAGNOSIS — I69320 Aphasia following cerebral infarction: Secondary | ICD-10-CM | POA: Diagnosis not present

## 2023-04-14 DIAGNOSIS — F0390 Unspecified dementia without behavioral disturbance: Secondary | ICD-10-CM | POA: Diagnosis not present

## 2023-04-14 DIAGNOSIS — I69318 Other symptoms and signs involving cognitive functions following cerebral infarction: Secondary | ICD-10-CM | POA: Diagnosis not present

## 2023-04-14 DIAGNOSIS — Z8616 Personal history of COVID-19: Secondary | ICD-10-CM | POA: Diagnosis not present

## 2023-04-14 DIAGNOSIS — R296 Repeated falls: Secondary | ICD-10-CM | POA: Diagnosis not present

## 2023-04-14 DIAGNOSIS — I69320 Aphasia following cerebral infarction: Secondary | ICD-10-CM | POA: Diagnosis not present

## 2023-04-14 DIAGNOSIS — I10 Essential (primary) hypertension: Secondary | ICD-10-CM | POA: Diagnosis not present

## 2023-06-13 DEATH — deceased
